# Patient Record
Sex: Female | Born: 1945
Health system: Southern US, Community
[De-identification: ages and names within clinical notes are randomized; demographics above are authoritative.]

## PROBLEM LIST (undated history)

## (undated) DIAGNOSIS — E785 Hyperlipidemia, unspecified: Secondary | ICD-10-CM

## (undated) DIAGNOSIS — K219 Gastro-esophageal reflux disease without esophagitis: Secondary | ICD-10-CM

## (undated) DIAGNOSIS — I10 Essential (primary) hypertension: Secondary | ICD-10-CM

## (undated) DIAGNOSIS — H409 Unspecified glaucoma: Secondary | ICD-10-CM

## (undated) DIAGNOSIS — G453 Amaurosis fugax: Secondary | ICD-10-CM

## (undated) HISTORY — DX: Essential (primary) hypertension: I10

## (undated) HISTORY — DX: Gastro-esophageal reflux disease without esophagitis: K21.9

## (undated) HISTORY — PX: NO PAST SURGERIES: SHX2092

## (undated) HISTORY — DX: Hyperlipidemia, unspecified: E78.5

## (undated) HISTORY — DX: Amaurosis fugax: G45.3

## (undated) HISTORY — DX: Unspecified glaucoma: H40.9

---

## 1998-10-02 ENCOUNTER — Other Ambulatory Visit: Admission: RE | Admit: 1998-10-02 | Discharge: 1998-10-02 | Payer: Self-pay | Admitting: *Deleted

## 1998-10-07 ENCOUNTER — Ambulatory Visit (HOSPITAL_COMMUNITY): Admission: RE | Admit: 1998-10-07 | Discharge: 1998-10-07 | Payer: Self-pay | Admitting: Internal Medicine

## 1999-12-04 ENCOUNTER — Ambulatory Visit (HOSPITAL_COMMUNITY): Admission: RE | Admit: 1999-12-04 | Discharge: 1999-12-04 | Payer: Self-pay | Admitting: Internal Medicine

## 1999-12-05 ENCOUNTER — Encounter: Payer: Self-pay | Admitting: Internal Medicine

## 2000-03-07 ENCOUNTER — Ambulatory Visit (HOSPITAL_COMMUNITY): Admission: RE | Admit: 2000-03-07 | Discharge: 2000-03-07 | Payer: Self-pay | Admitting: Family Medicine

## 2000-09-20 ENCOUNTER — Other Ambulatory Visit: Admission: RE | Admit: 2000-09-20 | Discharge: 2000-09-20 | Payer: Self-pay | Admitting: *Deleted

## 2002-11-01 ENCOUNTER — Other Ambulatory Visit: Admission: RE | Admit: 2002-11-01 | Discharge: 2002-11-01 | Payer: Self-pay | Admitting: *Deleted

## 2003-09-10 ENCOUNTER — Inpatient Hospital Stay (HOSPITAL_COMMUNITY): Admission: EM | Admit: 2003-09-10 | Discharge: 2003-09-11 | Payer: Self-pay | Admitting: Emergency Medicine

## 2005-07-27 ENCOUNTER — Other Ambulatory Visit: Admission: RE | Admit: 2005-07-27 | Discharge: 2005-07-27 | Payer: Self-pay | Admitting: Internal Medicine

## 2006-03-15 ENCOUNTER — Ambulatory Visit: Payer: Self-pay | Admitting: Family Medicine

## 2006-03-15 LAB — CONVERTED CEMR LAB
ALT: 17 units/L (ref 0–40)
Calcium: 9.4 mg/dL (ref 8.4–10.5)
Creatinine, Ser: 0.8 mg/dL (ref 0.4–1.2)
GFR calc non Af Amer: 78 mL/min
Glomerular Filtration Rate, Af Am: 94 mL/min/{1.73_m2}
Glucose, Bld: 117 mg/dL — ABNORMAL HIGH (ref 70–99)
VLDL: 10 mg/dL (ref 0–40)

## 2006-06-16 ENCOUNTER — Ambulatory Visit: Payer: Self-pay | Admitting: Family Medicine

## 2006-06-16 LAB — CONVERTED CEMR LAB
ALT: 22 units/L (ref 0–40)
BUN: 14 mg/dL (ref 6–23)
Calcium: 9.3 mg/dL (ref 8.4–10.5)
Chloride: 100 meq/L (ref 96–112)
GFR calc non Af Amer: 91 mL/min
Glucose, Bld: 111 mg/dL — ABNORMAL HIGH (ref 70–99)
Potassium: 4.4 meq/L (ref 3.5–5.1)
Total CHOL/HDL Ratio: 3.1
Triglycerides: 64 mg/dL (ref 0–149)
VLDL: 13 mg/dL (ref 0–40)

## 2006-07-15 ENCOUNTER — Ambulatory Visit: Payer: Self-pay | Admitting: Family Medicine

## 2006-11-14 ENCOUNTER — Ambulatory Visit: Payer: Self-pay | Admitting: Family Medicine

## 2006-11-14 DIAGNOSIS — E785 Hyperlipidemia, unspecified: Secondary | ICD-10-CM | POA: Insufficient documentation

## 2006-11-14 DIAGNOSIS — I10 Essential (primary) hypertension: Secondary | ICD-10-CM | POA: Insufficient documentation

## 2006-11-15 ENCOUNTER — Telehealth (INDEPENDENT_AMBULATORY_CARE_PROVIDER_SITE_OTHER): Payer: Self-pay | Admitting: *Deleted

## 2006-11-15 LAB — CONVERTED CEMR LAB
ALT: 21 units/L (ref 0–35)
AST: 21 units/L (ref 0–37)
Basophils Relative: 0.7 % (ref 0.0–1.0)
CO2: 34 meq/L — ABNORMAL HIGH (ref 19–32)
Calcium: 9.2 mg/dL (ref 8.4–10.5)
Chloride: 101 meq/L (ref 96–112)
Creatinine, Ser: 0.7 mg/dL (ref 0.4–1.2)
GFR calc non Af Amer: 91 mL/min
HDL: 43.8 mg/dL (ref >39.0)
Hemoglobin: 12.5 g/dL (ref 12.0–15.0)
Lymphocytes Relative: 39.3 % (ref 12.0–46.0)
MCHC: 34.3 g/dL (ref 30.0–36.0)
MCV: 83.8 fL (ref 78.0–100.0)
Neutro Abs: 2.5 10*3/uL (ref 1.4–7.7)
Neutrophils Relative %: 52.9 % (ref 43.0–77.0)
Platelets: 224 10*3/uL (ref 150–400)
Potassium: 3.6 meq/L (ref 3.5–5.1)
Total CHOL/HDL Ratio: 3.3
Triglycerides: 76 mg/dL (ref 0–149)
WBC: 4.7 10*3/uL (ref 4.5–10.5)

## 2007-01-04 ENCOUNTER — Ambulatory Visit (HOSPITAL_COMMUNITY): Admission: RE | Admit: 2007-01-04 | Discharge: 2007-01-04 | Payer: Self-pay | Admitting: Family Medicine

## 2007-01-04 LAB — HM MAMMOGRAPHY: HM Mammogram: NORMAL

## 2007-02-16 ENCOUNTER — Ambulatory Visit: Payer: Self-pay | Admitting: Family Medicine

## 2007-02-17 LAB — CONVERTED CEMR LAB
ALT: 23 units/L (ref 0–35)
AST: 24 units/L (ref 0–37)
Calcium: 9.6 mg/dL (ref 8.4–10.5)
GFR calc non Af Amer: 91 mL/min
Hgb A1c MFr Bld: 6.5 % — ABNORMAL HIGH (ref 4.6–6.0)
Potassium: 4 meq/L (ref 3.5–5.1)
Sodium: 142 meq/L (ref 135–145)

## 2007-02-20 ENCOUNTER — Telehealth (INDEPENDENT_AMBULATORY_CARE_PROVIDER_SITE_OTHER): Payer: Self-pay | Admitting: *Deleted

## 2007-02-23 ENCOUNTER — Ambulatory Visit: Payer: Self-pay | Admitting: Family Medicine

## 2007-02-28 ENCOUNTER — Telehealth (INDEPENDENT_AMBULATORY_CARE_PROVIDER_SITE_OTHER): Payer: Self-pay | Admitting: *Deleted

## 2007-02-28 LAB — CONVERTED CEMR LAB: Glucose, 2 hour: 166 mg/dL — ABNORMAL HIGH (ref 70–139)

## 2007-03-07 ENCOUNTER — Ambulatory Visit: Payer: Self-pay | Admitting: Family Medicine

## 2007-03-27 ENCOUNTER — Encounter: Admission: RE | Admit: 2007-03-27 | Discharge: 2007-03-27 | Payer: Self-pay | Admitting: Family Medicine

## 2007-04-18 ENCOUNTER — Telehealth (INDEPENDENT_AMBULATORY_CARE_PROVIDER_SITE_OTHER): Payer: Self-pay | Admitting: *Deleted

## 2007-05-16 ENCOUNTER — Encounter (INDEPENDENT_AMBULATORY_CARE_PROVIDER_SITE_OTHER): Payer: Self-pay | Admitting: Family Medicine

## 2007-05-22 ENCOUNTER — Telehealth (INDEPENDENT_AMBULATORY_CARE_PROVIDER_SITE_OTHER): Payer: Self-pay | Admitting: *Deleted

## 2007-06-15 ENCOUNTER — Ambulatory Visit: Payer: Self-pay | Admitting: Family Medicine

## 2007-06-15 LAB — CONVERTED CEMR LAB
ALT: 17 units/L (ref 0–35)
BUN: 12 mg/dL (ref 6–23)
Calcium: 9.3 mg/dL (ref 8.4–10.5)
Cholesterol: 140 mg/dL (ref 0–200)
Creatinine, Ser: 0.8 mg/dL (ref 0.4–1.2)
GFR calc Af Amer: 94 mL/min
Glucose, Bld: 111 mg/dL — ABNORMAL HIGH (ref 70–99)
LDL Cholesterol: 78 mg/dL (ref 0–99)
Microalb Creat Ratio: 3.2 mg/g (ref 0.0–30.0)
Potassium: 4 meq/L (ref 3.5–5.1)
Total CHOL/HDL Ratio: 3
VLDL: 16 mg/dL (ref 0–40)

## 2007-06-16 ENCOUNTER — Encounter (INDEPENDENT_AMBULATORY_CARE_PROVIDER_SITE_OTHER): Payer: Self-pay | Admitting: *Deleted

## 2007-10-03 ENCOUNTER — Ambulatory Visit: Payer: Self-pay | Admitting: Family Medicine

## 2007-10-03 DIAGNOSIS — K219 Gastro-esophageal reflux disease without esophagitis: Secondary | ICD-10-CM

## 2007-10-03 DIAGNOSIS — E119 Type 2 diabetes mellitus without complications: Secondary | ICD-10-CM

## 2007-10-04 LAB — CONVERTED CEMR LAB
ALT: 18 units/L (ref 0–35)
AST: 18 units/L (ref 0–37)
Alkaline Phosphatase: 53 units/L (ref 39–117)
Basophils Absolute: 0 10*3/uL (ref 0.0–0.1)
Basophils Relative: 0.3 % (ref 0.0–1.0)
Bilirubin, Direct: 0.1 mg/dL (ref 0.0–0.3)
Calcium: 9.2 mg/dL (ref 8.4–10.5)
Chloride: 103 meq/L (ref 96–112)
Eosinophils Relative: 1.4 % (ref 0.0–5.0)
GFR calc Af Amer: 109 mL/min
HCT: 37.6 % (ref 36.0–46.0)
HDL: 46.9 mg/dL (ref >39.0)
Hgb A1c MFr Bld: 6.4 % — ABNORMAL HIGH (ref 4.6–6.0)
LDL Cholesterol: 82 mg/dL (ref 0–99)
MCHC: 34.2 g/dL (ref 30.0–36.0)
Microalb Creat Ratio: 5.5 mg/g (ref 0.0–30.0)
Microalb, Ur: 0.2 mg/dL (ref 0.0–1.9)
Monocytes Relative: 6.5 % (ref 3.0–12.0)
Neutro Abs: 2.3 10*3/uL (ref 1.4–7.7)
Neutrophils Relative %: 50.9 % (ref 43.0–77.0)
Potassium: 4.3 meq/L (ref 3.5–5.1)
RBC: 4.46 M/uL (ref 3.87–5.11)
RDW: 13.5 % (ref 11.5–14.6)
Total CHOL/HDL Ratio: 3
Total Protein: 7 g/dL (ref 6.0–8.3)
VLDL: 15 mg/dL (ref 0–40)
WBC: 4.6 10*3/uL (ref 4.5–10.5)

## 2007-10-05 ENCOUNTER — Encounter (INDEPENDENT_AMBULATORY_CARE_PROVIDER_SITE_OTHER): Payer: Self-pay | Admitting: *Deleted

## 2008-01-10 ENCOUNTER — Telehealth (INDEPENDENT_AMBULATORY_CARE_PROVIDER_SITE_OTHER): Payer: Self-pay | Admitting: *Deleted

## 2008-01-23 ENCOUNTER — Ambulatory Visit: Payer: Self-pay | Admitting: Internal Medicine

## 2008-02-14 ENCOUNTER — Telehealth (INDEPENDENT_AMBULATORY_CARE_PROVIDER_SITE_OTHER): Payer: Self-pay | Admitting: *Deleted

## 2008-05-07 ENCOUNTER — Encounter: Payer: Self-pay | Admitting: Internal Medicine

## 2008-05-17 ENCOUNTER — Encounter: Payer: Self-pay | Admitting: Internal Medicine

## 2008-05-27 ENCOUNTER — Ambulatory Visit: Payer: Self-pay | Admitting: Internal Medicine

## 2008-05-27 LAB — HM DIABETES FOOT EXAM

## 2008-05-29 LAB — CONVERTED CEMR LAB
AST: 17 units/L (ref 0–37)
Calcium: 9.3 mg/dL (ref 8.4–10.5)
GFR calc Af Amer: 109 mL/min
GFR calc non Af Amer: 90 mL/min
Glucose, Bld: 118 mg/dL — ABNORMAL HIGH (ref 70–99)
Hgb A1c MFr Bld: 6.6 % — ABNORMAL HIGH (ref 4.6–6.0)

## 2008-09-24 ENCOUNTER — Ambulatory Visit: Payer: Self-pay | Admitting: Internal Medicine

## 2008-09-25 ENCOUNTER — Telehealth: Payer: Self-pay | Admitting: Internal Medicine

## 2008-09-25 ENCOUNTER — Encounter: Admission: RE | Admit: 2008-09-25 | Discharge: 2008-09-25 | Payer: Self-pay | Admitting: Internal Medicine

## 2008-09-26 ENCOUNTER — Encounter: Payer: Self-pay | Admitting: Internal Medicine

## 2008-09-26 ENCOUNTER — Ambulatory Visit: Payer: Self-pay

## 2008-09-27 ENCOUNTER — Telehealth (INDEPENDENT_AMBULATORY_CARE_PROVIDER_SITE_OTHER): Payer: Self-pay | Admitting: *Deleted

## 2008-10-01 ENCOUNTER — Ambulatory Visit: Payer: Self-pay

## 2008-10-01 ENCOUNTER — Encounter: Payer: Self-pay | Admitting: Internal Medicine

## 2008-10-08 ENCOUNTER — Telehealth (INDEPENDENT_AMBULATORY_CARE_PROVIDER_SITE_OTHER): Payer: Self-pay | Admitting: *Deleted

## 2008-12-11 ENCOUNTER — Other Ambulatory Visit: Admission: RE | Admit: 2008-12-11 | Discharge: 2008-12-11 | Payer: Self-pay | Admitting: Obstetrics and Gynecology

## 2009-01-28 ENCOUNTER — Ambulatory Visit: Payer: Self-pay | Admitting: Internal Medicine

## 2009-02-03 LAB — CONVERTED CEMR LAB
ALT: 17 units/L (ref 0–35)
AST: 20 units/L (ref 0–37)
BUN: 10 mg/dL (ref 6–23)
Creatinine, Ser: 0.7 mg/dL (ref 0.4–1.2)
Glucose, Bld: 86 mg/dL (ref 70–99)
Potassium: 3.6 meq/L (ref 3.5–5.1)
Sodium: 144 meq/L (ref 135–145)

## 2009-05-01 ENCOUNTER — Encounter: Payer: Self-pay | Admitting: Internal Medicine

## 2009-05-01 LAB — HM DIABETES EYE EXAM: HM Diabetic Eye Exam: NORMAL

## 2009-05-19 ENCOUNTER — Encounter: Payer: Self-pay | Admitting: Internal Medicine

## 2009-06-11 ENCOUNTER — Ambulatory Visit: Payer: Self-pay | Admitting: Internal Medicine

## 2009-06-11 DIAGNOSIS — E559 Vitamin D deficiency, unspecified: Secondary | ICD-10-CM

## 2009-06-13 LAB — CONVERTED CEMR LAB: Hgb A1c MFr Bld: 6.4 % (ref 4.6–6.5)

## 2009-10-14 ENCOUNTER — Ambulatory Visit: Payer: Self-pay | Admitting: Internal Medicine

## 2009-10-14 DIAGNOSIS — R51 Headache: Secondary | ICD-10-CM

## 2009-10-14 DIAGNOSIS — M7989 Other specified soft tissue disorders: Secondary | ICD-10-CM

## 2009-10-16 LAB — CONVERTED CEMR LAB
BUN: 15 mg/dL (ref 6–23)
Basophils Relative: 0.3 % (ref 0.0–3.0)
Creatinine,U: 25.8 mg/dL
HCT: 37 % (ref 36.0–46.0)
HDL: 49.2 mg/dL (ref >39.00)
Hemoglobin: 12.7 g/dL (ref 12.0–15.0)
Lymphocytes Relative: 41 % (ref 12.0–46.0)
MCHC: 34.4 g/dL (ref 30.0–36.0)
Microalb Creat Ratio: 4.3 mg/g (ref 0.0–30.0)
Microalb, Ur: 1.1 mg/dL (ref 0.0–1.9)
Monocytes Relative: 8.1 % (ref 3.0–12.0)
Neutrophils Relative %: 48.9 % (ref 43.0–77.0)
Platelets: 186 10*3/uL (ref 150.0–400.0)
RBC: 4.42 M/uL (ref 3.87–5.11)
Total CHOL/HDL Ratio: 3
Triglycerides: 65 mg/dL (ref 0.0–149.0)
VLDL: 13 mg/dL (ref 0.0–40.0)

## 2009-10-17 ENCOUNTER — Telehealth (INDEPENDENT_AMBULATORY_CARE_PROVIDER_SITE_OTHER): Payer: Self-pay | Admitting: *Deleted

## 2010-01-16 ENCOUNTER — Telehealth (INDEPENDENT_AMBULATORY_CARE_PROVIDER_SITE_OTHER): Payer: Self-pay | Admitting: *Deleted

## 2010-02-09 ENCOUNTER — Telehealth: Payer: Self-pay | Admitting: Internal Medicine

## 2010-02-09 ENCOUNTER — Ambulatory Visit: Payer: Self-pay | Admitting: Internal Medicine

## 2010-02-11 ENCOUNTER — Ambulatory Visit: Payer: Self-pay | Admitting: Internal Medicine

## 2010-02-11 DIAGNOSIS — T887XXA Unspecified adverse effect of drug or medicament, initial encounter: Secondary | ICD-10-CM | POA: Insufficient documentation

## 2010-02-20 ENCOUNTER — Telehealth: Payer: Self-pay | Admitting: Internal Medicine

## 2010-05-17 LAB — CONVERTED CEMR LAB
Hgb A1c MFr Bld: 6.5 % (ref 4.6–6.5)
LDL Cholesterol: 73 mg/dL (ref 0–99)
Microalb Creat Ratio: 1.3 mg/g (ref 0.0–30.0)
Microalb, Ur: 0.1 mg/dL (ref 0.0–1.9)
Total CHOL/HDL Ratio: 3
VLDL: 13.6 mg/dL (ref 0.0–40.0)

## 2010-05-19 NOTE — Progress Notes (Signed)
Summary: PAZ--REFILL  Phone Note Refill Request   Refills Requested: Medication #1:  ACIPHEX 20 MG  TBEC Take one tablet every day [facility] [address][phone] [phone]--LAST FILLED--8.24.09  Initial call taken by: Freddy Jaksch,  February 14, 2008 10:41 AM      Prescriptions: ACIPHEX 20 MG  TBEC (RABEPRAZOLE SODIUM) Take one tablet every day  #30 x 4   Entered by:   Kandice Hams   Authorized by:   Nolon Rod. Paz MD   Signed by:   Kandice Hams on 02/14/2008   Method used:   [facility]ed to ...       [facility]  [address]  [facility]       [address]       [place], [place]  [address]       Ph: [phone] or [phone]       Fax: [phone]   RxID:   [id number]

## 2010-05-19 NOTE — Progress Notes (Signed)
Summary: u/s results  Phone Note Outgoing Call Call back at [facility] Phone [phone]   Summary of Call: U/S RESULTS: advise patient: ,u/s ok call if any symptoms came back   Signed by [facility] E. Paz MD on 10/07/2008 at 10:10 PM   Follow-up for Phone Call        discussed with daughter Shary Decamp  October 08, 2008 3:38 PM

## 2010-05-19 NOTE — Letter (Signed)
Summary: (-) eye exam-----[facility]  [facility]   Imported By: Lanelle Bal 05/23/2008 11:13:53  _____________________________________________________________________  External Attachment:    Type:   Image     Comment:   External Document

## 2010-05-19 NOTE — Medication Information (Signed)
Summary: Prior Authorization & Approval for Aciphex/[employer]  Prior Authorization & Approval for Aciphex/[employer]   Imported By: Lanelle Bal 02/18/2010 15:32:42  _____________________________________________________________________  External Attachment:    Type:   Image     Comment:   External Document

## 2010-05-19 NOTE — Progress Notes (Signed)
Summary: Casey Bryan AUTH  Phone Note Refill Request Message from:  Fax from Pharmacy on February 09, 2010 3:26 PM  Refills Requested: Medication #1:  ACIPHEX 20 MG  TBEC Take one tablet every day  Medication #2:  VYTORIN 10-10 MG TABS 1 tab daily [facility] [address], [place] [phone]       ***PRIOR AUTH FOR TWO PRESCRIPTIONS*****   [phone]     PT ID# [id number]  Initial call taken by: Jerolyn Shin,  February 09, 2010 3:27 PM  Follow-up for Phone Call        Member id:  [id number] forms requested. Lucious Groves CMA  February 09, 2010 3:32 PM   Vytorin does not need prior authorization, it is rejecting--"refill too early". Patient can fill prescription tomorrow. Lucious Groves CMA  February 09, 2010 3:49 PM   Aciphex form completed and faxed, will await insurance company reply. Lucious Groves CMA  February 09, 2010 4:07 PM      Appended Document: Octavia Bruckner  PRIOR AUTH Aciphex approved until 01/2011.

## 2010-05-19 NOTE — Progress Notes (Signed)
Summary: LOWNE--REFILL  Phone Note Refill Request   Refills Requested: Medication #1:  HYDROCHLOROTHIAZIDE 25 MG  TABS take one tablet daily  Medication #2:  VYTORIN 10-10 MG TABS 1 tab daily  Medication #3:  METOPROLOL TARTRATE 50 MG  TABS 1 twice daily  Medication #4:  ACIPHEX 20 MG  TBEC Take one tablet every day [facility] [address][phone]  Initial call taken by: Freddy Jaksch,  January 10, 2008 8:43 AM  Follow-up for Phone Call        Zocor is on pt med list, pt said she never took the Zocor did not like it has rx but never filled, she it taking Vytorin. Pt has appt 01/23/08 with Dr Drue Novel, per Dr Laury Axon ok to fill rx for 30 days. Kandice Hams  January 10, 2008 11:28 AM  Follow-up by: Kandice Hams,  January 10, 2008 11:28 AM      Prescriptions: HYDROCHLOROTHIAZIDE 25 MG  TABS (HYDROCHLOROTHIAZIDE) take one tablet daily  #30 x 0   Entered by:   Kandice Hams   Authorized by:   Loreen Freud DO   Signed by:   Kandice Hams on 01/10/2008   Method used:   [facility] ...       [facility]  [address]  [facility]       [address]       [place], [place]  [address]       Ph: [phone] or [phone]       Fax: [phone]   RxID:   [id number] ACIPHEX 20 MG  TBEC (RABEPRAZOLE SODIUM) Take one tablet every day  #30 x 0   Entered by:   Kandice Hams   Authorized by:   Loreen Freud DO   Signed by:   Kandice Hams on 01/10/2008   Method used:   [facility]ed to ...       [facility]  [address]  [facility]       [address]       [place], [place]  [address]       Ph: [phone] or [phone]       Fax: [phone]   RxID:   [id number] METOPROLOL TARTRATE 50 MG  TABS (METOPROLOL TARTRATE) 1 twice daily  #60 x 0   Entered by:   Kandice Hams   Authorized by:   Loreen Freud DO   Signed by:   Kandice Hams on 01/10/2008   Method used:   [facility]ed to ...       [facility]  [address]  [facility]  [address]       [place], [place]  [address]       Ph: [phone] or [phone]       Fax: [phone]   RxID:   [id number] VYTORIN 10-10 MG TABS (EZETIMIBE-SIMVASTATIN) 1 tab daily  #30 x 0   Entered by:   Kandice Hams   Authorized by:   Loreen Freud DO   Signed by:   Kandice Hams on 01/10/2008   Method used:   F[facility]ed to ...       [facility]  [address]  [facility]       [address]       [place], [place]  [address]       Ph: [phone] or [phone]       Fax: [phone]   RxID:   [id number]

## 2010-05-19 NOTE — Progress Notes (Signed)
Summary: refill  Phone Note Refill Request   Refills Requested: Medication #1:  ACIPHEX 20 MG  TBEC Take one tablet every day  Medication #2:  VYTORIN 10-10 MG TABS 1 tab daily  Medication #3:  VITAMIN D (ERGOCALCIFEROL) 50000 UNIT CAPS 1 by mouth once weekly for 3 months.[employer]   Initial call taken by: Okey Regal [place],  January 16, 2010 4:07 PM    Prescriptions: ACIPHEX 20 MG  TBEC (RABEPRAZOLE SODIUM) Take one tablet every day  #90 Tablet x 0   Entered by:   Army Fossa CMA   Authorized by:   Nolon Rod. Paz MD   Signed by:   Army Fossa CMA on 01/16/2010   Method used:   [facility]ronically to        [facility] [address] [facility]       [address]       [place], [place]  [address]       Ph: [phone] or [phone]       Fax: [phone]   RxID:   [id number] VYTORIN 10-10 MG TABS (EZETIMIBE-SIMVASTATIN) 1 tab daily  #90 Tablet x 0   Entered by:   Army Fossa CMA   Authorized by:   Nolon Rod. Paz MD   Signed by:   Army Fossa CMA on 01/16/2010   Method used:   [facility]ronically to        [facility] [address] [facility]       [address]       [place], [place]  [address]       Ph: [phone] or [phone]       Fax: [phone]   RxID:   [id number]

## 2010-05-19 NOTE — Letter (Signed)
Summary: CMN for Diabetes Supplies/[facility]  CMN for Diabetes Supplies/[facility]   Imported By: Lanelle Bal 05/13/2008 13:45:20  _____________________________________________________________________  External Attachment:    Type:   Image     Comment:   External Document

## 2010-05-19 NOTE — Progress Notes (Signed)
Summary: 6/9  Phone Note Call from Patient Call back at [phone] DAUGHTER Casey Bryan   Caller: Daughter Call For: Casey E. Adalea Handler MD Reason for Call: Talk to Nurse, Lab or Test Results, Referral Summary of Call: PATIENT'S DAUGHTER, Casey Bryan, IS CALLING FOR RESULTS OF MRI.  Casey Bryan ALSO WANTS TO KNOW WHY DR. Lorah Kalina IS REFERRING PATIENT FOR AN ECHO & A CAROTID US.  THE APPOINTMENT FOR THE ECHO IS TOMORROW, 09-26-08 @ 4PM @ [facility].  I INFORMED Casey Bryan, BUT SHE WOULD NOT CONFIRM, WANTS ANSWERS PRIOR.   Initial call taken by: Magdalen Spatz [facility],  September 25, 2008 3:28 PM  Follow-up for Phone Call        [facility] for pt to return call Shary Decamp  September 25, 2008 4:06 PM  discussed with daughter reason for ECHO & carotid u/s -- appt info given to pt about ECHO Shary Decamp  September 25, 2008 4:20 PM

## 2010-05-19 NOTE — Assessment & Plan Note (Signed)
Summary: ro4/4 month ov//ph   Vital Signs:  Patient Profile:   65 Years Old Female Weight:      233 pounds Pulse rate:   66 / minute Pulse rhythm:   regular BP sitting:   146 / 82  (left arm) Cuff size:   large  Vitals Entered By: Shary Decamp (May 27, 2008 9:24 AM)                 PCP:  Laury Axon  Chief Complaint:  rov - fasting.  History of Present Illness: ROV, here w/ 2 family members  cold x 2 weeks, getting better , wonders what is ok to use OTC (see instructions ) Hyperlipidemia-- states eats low fat Hypertension-- no ambulatory BPs  Diabetes-ambulatory CBGs in AM 106 to  145     Current Allergies (reviewed today): ! PENICILLIN  Past Medical History:    Reviewed history from 10/03/2007 and no changes required:       Hyperlipidemia       Hypertension       GERD       Diabetes mellitus, type II  Past Surgical History:    no   Family History:    MI-- F, brother, GP    DM-- (+) , late onset, parents and uncles     colon ca--no    breast ca-- aunt     m myeloma-- sister   Social History:    Reviewed history from 01/23/2008 and no changes required:       Married       3 children        original  [place]   Risk Factors:  Tobacco use:  quit    Year quit:  yearsa ago    Comments:  socially    Review of Systems  CV      Denies chest pain or discomfort, palpitations, and swelling of feet.  Resp      Denies shortness of breath.      cough sometimes (had a cold)  Neuro      occ R arm tingling from the neck down had a MRI done years ago was told she had a "pinck nerve" symptoms are from time to time   Endo      no LE paresthesias    Physical Exam  General:     alert and overweight-appearing.   Lungs:     normal respiratory effort, no intercostal retractions, no accessory muscle use, and normal breath sounds.   Heart:     normal rate, regular rhythm, and no murmur.   Pulses:     normal pedal pulses bilaterally  Extremities:    no pretibial edema bilaterally  Psych:     Cognition and judgment appear intact. Alert and cooperative with normal attention span and concentration.    Diabetes Management Exam:    Foot Exam (with socks and/or shoes not present):       Sensory-Pinprick/Light touch:          Left medial foot (L-4): normal          Left dorsal foot (L-5): normal          Left lateral foot (S-1): normal          Right medial foot (L-4): normal          Right dorsal foot (L-5): normal          Right lateral foot (S-1): normal       Sensory-Monofilament:  Left foot: normal          Right foot: normal       Inspection:          Left foot: normal          Right foot: normal       Nails:          Left foot: normal          Right foot: normal    Eye Exam:       Eye Exam done elsewhere          Date: 04/19/2008          Results: normal          Done by: Hazle Quant    Impression & Recommendations:  Problem # 1:  DIABETES MELLITUS, TYPE II (ICD-250.00) needs Rx for glucometer, done  see instructions  printed material provided regards A1C-CBG goals  check CBGs at different times during the day eye check 1-10, neg Orders: Venipuncture ([address]) TLB-A1C / Hgb A1C (Glycohemoglobin) (83036-A1C)  Labs Reviewed: HgBA1c: 6.3 (01/23/2008)   Creat: 0.7 (10/03/2007)   Microalbumin: 0.2 (10/03/2007)  Orders: Venipuncture ([address]) TLB-A1C / Hgb A1C (Glycohemoglobin) (83036-A1C)   Problem # 2:  HYPERTENSION (ICD-401.9) BP slightly  elevated today, usually good, see instructions  Her updated medication list for this problem includes:    Metoprolol Tartrate 50 Mg Tabs (Metoprolol tartrate) .Marland Kitchen... 1 twice daily    Hydrochlorothiazide 25 Mg Tabs (Hydrochlorothiazide) .Marland Kitchen... Take one tablet daily  Orders: TLB-BMP (Basic Metabolic Panel-BMET) (80048-METABOL) TLB-ALT (SGPT) (84460-ALT) TLB-AST (SGOT) (84450-SGOT)  BP today: 146/82 Prior BP: 138/76 (01/23/2008)  Labs Reviewed: Creat: 0.7  (10/03/2007) Chol: 143 (10/03/2007)   HDL: 46.9 (10/03/2007)   LDL: 82 (10/03/2007)   TG: 73 (10/03/2007)   Problem # 3:  HYPERLIPIDEMIA (ICD-272.4) labs  Her updated medication list for this problem includes:    Vytorin 10-10 Mg Tabs (Ezetimibe-simvastatin) .Marland Kitchen... 1 tab daily  Labs Reviewed: Chol: 143 (10/03/2007)   HDL: 46.9 (10/03/2007)   LDL: 82 (10/03/2007)   TG: 73 (10/03/2007) SGOT: 18 (10/03/2007)   SGPT: 18 (10/03/2007)   Problem # 4:  URI  resolving but likes to know what to take in that situation: see instructions   Complete Medication List: 1)  Vytorin 10-10 Mg Tabs (Ezetimibe-simvastatin) .Marland Kitchen.. 1 tab daily 2)  Metoprolol Tartrate 50 Mg Tabs (Metoprolol tartrate) .Marland Kitchen.. 1 twice daily 3)  Aciphex 20 Mg Tbec (Rabeprazole sodium) .... Take one tablet every day 4)  Hydrochlorothiazide 25 Mg Tabs (Hydrochlorothiazide) .... Take one tablet daily 5)  Zantac 150 Mg Caps (Ranitidine hcl) .Marland Kitchen.. 1 by mouth two times a day as needed 6)  Advocate Redi-code Devi (Blood glucose monitoring suppl) .... Dx 250.00 7)  Advocate Redi-code Strp (Glucose blood) .... Checks blood sugar 1x/day dx 250.00 8)  Advocate Lancets Misc (Lancets) .... Check bs 1x/day dx 250.00   Patient Instructions: 1)  Check your blood pressure 2 or 3 times a week. If it is more than 140/85 consistently,please let us know  2)  for colds is ok to use: Tylenol, Mucnex DM  and benadryl OTCs 3)  Please schedule a follow-up appointment in 4 months.   Prescriptions: ADVOCATE LANCETS  MISC (LANCETS) CHECK BS 1X/DAY DX 250.00  #100 x 1   Entered by:   Shary Decamp   Authorized by:   Nolon Rod. Waldine Zenz MD   Signed by:   Shary Decamp on 05/27/2008   Method used:   Print then  Give to Patient   RxID:   [id number] ADVOCATE REDI-CODE  STRP (GLUCOSE BLOOD) CHECKS BLOOD SUGAR 1X/DAY DX 250.00  #100 x 1   Entered by:   Shary Decamp   Authorized by:   Nolon Rod. Nichael Ehly MD   Signed by:   Shary Decamp on 05/27/2008   Method used:   Print  then Give to Patient   RxID:   [id number] ADVOCATE REDI-CODE  DEVI (BLOOD GLUCOSE MONITORING SUPPL) dx 250.00  #1 x 0   Entered by:   Shary Decamp   Authorized by:   Nolon Rod. Juanice Warburton MD   Signed by:   Shary Decamp on 05/27/2008   Method used:   Print then Give to Patient   RxID:   [id number]

## 2010-05-19 NOTE — Assessment & Plan Note (Signed)
Summary: 4 MONTH FOLLOWUP//KN   Vital Signs:  Patient profile:   65 year old female Weight:      231.38 pounds Pulse rate:   58 / minute Pulse rhythm:   regular BP sitting:   138 / 82  (left arm) Cuff size:   large  Vitals Entered By: Army Fossa CMA (February 09, 2010 10:38 AM) CC: 4 month f/u- fasting Comments c/o dry skin on toes flu shot [facility] [address]   History of Present Illness:  routine office visit  Since the last office visit, she is doing well, she was seen with back pain which is now better. She also has a left great toe ingrown  nail which spontaneously resolved  Current Medications (verified): 1)  Vytorin 10-10 Mg Tabs (Ezetimibe-Simvastatin) .Marland Kitchen.. 1 Tab Daily 2)  Metoprolol Tartrate 50 Mg  Tabs (Metoprolol Tartrate) .Marland Kitchen.. 1 Twice Daily 3)  Aciphex 20 Mg  Tbec (Rabeprazole Sodium) .... Take One Tablet Every Day 4)  Hydrochlorothiazide 25 Mg  Tabs (Hydrochlorothiazide) .... Take One Tablet Daily 5)  Zantac 150 Mg  Caps (Ranitidine Hcl) .Marland Kitchen.. 1 By Mouth Once Daily As Needed 6)  Advocate Redi-Code  Devi (Blood Glucose Monitoring Suppl) .... Dx 250.00 7)  Advocate Redi-Code  Strp (Glucose Blood) .... Checks Blood Sugar 1x/day Dx 250.00 8)  Advocate Lancets  Misc (Lancets) .... Check Bs 1x/day Dx 250.00 9)  Calcium 600 .... Bid 10)  Vitamin D 400 .... Once Daily  Allergies (verified): 1)  ! Penicillin  Past History:  Past Medical History: Reviewed history from 10/14/2009 and no changes required. Hyperlipidemia Hypertension GERD Diabetes mellitus, type II  Past Surgical History: Reviewed history from 05/27/2008 and no changes required. no  Social History: Reviewed history from 01/23/2008 and no changes required. Married 3 children  original  [place] tobacco--no ETOH-- socially   Review of Systems       she is status post ergocalciferol, now taking over-the-counter vitamin D As far as her diabetes, her ambulatory CBGs are always less than  140 As far as her hyperlipidemia, she takes her medications every day. She is doing better with her diet, has cut down on sweets She is trying to walk more than before Desires a flu shot  Physical Exam  General:  alert, well-developed, and overweight-appearing.   Lungs:  normal respiratory effort, no intercostal retractions, no accessory muscle use, and normal breath sounds.   Heart:  normal rate, regular rhythm, and no murmur.   Extremities:  no pitting edema left great toe normal to inspection, no evidence of infection   Impression & Recommendations:  Problem # 1:  VITAMIN D DEFICIENCY (ICD-268.9) status post ergocalciferol, now on over-the-counter meds  Problem # 2:  DIABETES MELLITUS, TYPE II (ICD-250.00) patient reports improvement on her lifestyle...Marland KitchenMarland Kitchen praised  Labs Labs Reviewed: Creat: 0.7 (10/14/2009)     Last Eye Exam: normal (05/01/2009) Reviewed HgBA1c results: 6.7 (10/14/2009)  6.4 (06/11/2009)  Orders: TLB-A1C / Hgb A1C (Glycohemoglobin) (83036-A1C) TLB-TSH (Thyroid Stimulating Hormone) (84443-TSH) Specimen Handling ([address])  Problem # 3:  HYPERTENSION (ICD-401.9) at goal  Her updated medication list for this problem includes:    Metoprolol Tartrate 50 Mg Tabs (Metoprolol tartrate) .Marland Kitchen... 1 twice daily    Hydrochlorothiazide 25 Mg Tabs (Hydrochlorothiazide) .Marland Kitchen... Take one tablet daily  BP today: 138/82 Prior BP: 142/84 (10/14/2009)  Labs Reviewed: K+: 4.3 (10/14/2009) Creat: : 0.7 (10/14/2009)   Chol: 148 (10/14/2009)   HDL: 49.20 (10/14/2009)   LDL: 86 (10/14/2009)   TG:  65.0 (10/14/2009)  Problem # 4:  HYPERLIPIDEMIA (ICD-272.4)  good medication compliance, check LFTs Her updated medication list for this problem includes:    Vytorin 10-10 Mg Tabs (Ezetimibe-simvastatin) .Marland Kitchen... 1 tab daily  Labs Reviewed: SGOT: 20 (01/28/2009)   SGPT: 17 (01/28/2009)   HDL:49.20 (10/14/2009), 47.40 (09/24/2008)  LDL:86 (10/14/2009), 73 (09/24/2008)  Chol:148  (10/14/2009), 134 (09/24/2008)  Trig:65.0 (10/14/2009), 68.0 (09/24/2008)  Orders: Venipuncture ([address]) TLB-ALT (SGPT) (84460-ALT) TLB-AST (SGOT) (84450-SGOT) Specimen Handling ([address])  Problem # 5:  HEALTH SCREENING (ICD-V70.0) last tetanus shot less than 10 years per patient Flu shot today Due for a complete physical, see instructions would like to start having her PAPs her  Complete Medication List: 1)  Vytorin 10-10 Mg Tabs (Ezetimibe-simvastatin) .Marland Kitchen.. 1 tab daily 2)  Metoprolol Tartrate 50 Mg Tabs (Metoprolol tartrate) .Marland Kitchen.. 1 twice daily 3)  Aciphex 20 Mg Tbec (Rabeprazole sodium) .... Take one tablet every day 4)  Hydrochlorothiazide 25 Mg Tabs (Hydrochlorothiazide) .... Take one tablet daily 5)  Zantac 150 Mg Caps (Ranitidine hcl) .Marland Kitchen.. 1 by mouth once daily as needed 6)  Advocate Redi-code Devi (Blood glucose monitoring suppl) .... Dx 250.00 7)  Advocate Redi-code Strp (Glucose blood) .... Checks blood sugar 1x/day dx 250.00 8)  Advocate Lancets Misc (Lancets) .... Check bs 1x/day dx 250.00 9)  Calcium 1gram  .... Two times a day 10)  Vitamin D 600  .... Once daily  Other Orders: Admin 1st Vaccine (02637) Flu Vaccine 50yrs + (85885) Tdap => 54yrs IM (02774) Admin of Any Addtl Vaccine (12878)  Patient Instructions: 1)  Please schedule a follow-up appointment in 4 to 5  months, physical exam Prescriptions: HYDROCHLOROTHIAZIDE 25 MG  TABS (HYDROCHLOROTHIAZIDE) take one tablet daily  #90 Tablet x 2   Entered by:   Army Fossa CMA   Authorized by:   Nolon Rod. Paz MD   Signed by:   Army Fossa CMA on 02/09/2010   Method used:   [facility] [address]       [address]       [place], [place]  [address]       Ph: [phone] or [phone]       Fax: [phone]   RxID:   [id number] ACIPHEX 20 MG  TBEC (RABEPRAZOLE SODIUM) Take one tablet every day  #90 Tablet x 2   Entered by:   Army Fossa CMA   Authorized by:   Nolon Rod. Paz  MD   Signed by:   Army Fossa CMA on 02/09/2010   Method used:   Electronically to        [facility] [address]       [address]       [place], [place]  [address]       Ph: [phone] or [phone]       Fax: [phone]   RxID:   [id number] METOPROLOL TARTRATE 50 MG  TABS (METOPROLOL TARTRATE) 1 twice daily  #180 Tablet x 2   Entered by:   Army Fossa CMA   Authorized by:   Nolon Rod. Paz MD   Signed by:   Army Fossa CMA on 02/09/2010   Method used:   [facility]        [facility]       [address]       [place], [place]  [address]       Ph: [phone] or [phone]  Fax: [phone]   RxID:   [id number] VYTORIN 10-10 MG TABS (EZETIMIBE-SIMVASTATIN) 1 tab daily  #90 Tablet x 2   Entered by:   Army Fossa CMA   Authorized by:   Nolon Rod. Paz MD   Signed by:   Army Fossa CMA on 02/09/2010   Method used:   [facility]ronically to        [facility] [address]       [address]       [place], [place]  [address]       Ph: [phone] or [phone]       Fax: [phone]   RxID:   [id number] VYTORIN 10-10 MG TABS (EZETIMIBE-SIMVASTATIN) 1 tab daily  #90 Tablet x 0   Entered by:   Army Fossa CMA   Authorized by:   Nolon Rod. Paz MD   Signed by:   Army Fossa CMA on 02/09/2010   Method used:   Electronically to        [facility]* (retail)       [address]       [place], [place]  [address]       Ph: [phone]       Fax: [phone]   RxID:   [id number]    Orders Added: 1)  Venipuncture [36415] 2)  TLB-A1C / Hgb A1C (Glycohemoglobin) [83036-A1C] 3)  TLB-ALT (SGPT) [84460-ALT] 4)  TLB-AST (SGOT) [84450-SGOT] 5)  TLB-TSH (Thyroid Stimulating Hormone) [84443-TSH] 6)  Specimen Handling [99000] 7)  Admin 1st Vaccine [90471] 8)  Flu Vaccine 31yrs + [90658] 9)  Tdap => 62yrs IM [90715] 10)  Admin of Any Addtl Vaccine [90472] 11)  Est. Patient Level III [62831] Flu Vaccine  Consent Questions     Do you have a history of severe allergic reactions to this vaccine? no    Any prior history of allergic reactions to egg and/or gelatin? no    Do you have a sensitivity to the preservative Thimersol? no    Do you have a past history of Guillan-Barre Syndrome? no    Do you currently have an acute febrile illness? no    Have you ever had a severe reaction to latex? no    Vaccine information given and explained to patient? yes    Are you currently pregnant? no    Lot Number:[id number]   Exp Date:10/17/2010   Site Given  right Deltoid IM 7)  Admin 1st Vaccine [90471] 8)  Flu Vaccine 70yrs + [51761]   Immunizations Administered:  Tetanus Vaccine:    Vaccine Type: Tdap    Site: left deltoid    Mfr: GlaxoSmithKline    Dose: 0.5 ml    Route: IM    Given by: Army Fossa CMA    Exp. Date: 02/06/2012    Lot #: [id number]   Immunizations Administered:  Tetanus Vaccine:    Vaccine Type: Tdap    Site: left deltoid    Mfr: GlaxoSmithKline    Dose: 0.5 ml    Route: IM    Given by: Army Fossa CMA    Exp. Date: 02/06/2012    Lot #: [id number]   .lbflu1

## 2010-05-19 NOTE — Miscellaneous (Signed)
Summary: no diabetic retinopathy   Clinical Lists Changes  Observations: Added new observation of DMEYEEXAMNXT: 04/2010 (05/01/2009 13:48) Added new observation of DMEYEEXMRES: normal (05/01/2009 13:48) Added new observation of EYE EXAM BY: [facility] (05/01/2009 13:48) Added new observation of DIAB EYE EX: normal (05/01/2009 13:48)       Allergies: 1)  ! Penicillin   Diabetes Management Exam:    Eye Exam:       Eye Exam done elsewhere          Date: 05/01/2009          Results: normal          Done by: [facility]

## 2010-05-19 NOTE — Letter (Signed)
Summary: Results Follow-up Letter  [place] [facility]  [address] [place], [place] [address]   Phone: [phone]  Fax: [phone]    06/16/2007        Casey Bryan [address] [place], [place]  [address]  Dear Ms. Danser,   The following are the results of your recent test(s):  Test     Result     Pap Smear    Normal_______  Not Normal_____       Comments: _________________________________________________________ Cholesterol LDL(Bad cholesterol):          Your goal is less than:         HDL (Good cholesterol):        Your goal is more than: _________________________________________________________ Other Tests:   _________________________________________________________  Please call for an appointment Or Please see attached._________________________________________________________ _________________________________________________________ _________________________________________________________  Sincerely,  Ardyth Man [place] at [facility]

## 2010-05-19 NOTE — Assessment & Plan Note (Signed)
Summary: 4 month roa//lch   Vital Signs:  Patient profile:   65 year old female Weight:      232.50 pounds Pulse rate:   65 / minute Pulse rhythm:   regular BP sitting:   142 / 84  (left arm) Cuff size:   large  Vitals Entered By: Army Fossa CMA (October 14, 2009 10:32 AM) CC: Pt here to follow up on BP, cholesterol, and DM. Comments C/o back pain that now goes down her leg.   History of Present Illness: ROV Hyperlipidemia-- good medication compliance , diet good  Hypertension-- check ambulatory BPs  "sometimes", occasionally in the 160s.usually in the 140s Diabetes -- ambulatory CBGs 110 to 145, on diet only   also c/o back pain w/ rad to L leg on-off x 3 years had MRIs, saw ortho, dx w/ a "pinch nerve" options were surgery-local shots-pain meds   lites her vitamin D check  Allergies: 1)  ! Penicillin  Past History:  Past Medical History: Hyperlipidemia Hypertension GERD Diabetes mellitus, type II  Past Surgical History: Reviewed history from 05/27/2008 and no changes required. no  Social History: Reviewed history from 01/23/2008 and no changes required. Married 3 children  original  [place]  Review of Systems General:  no fever no b/b incontinence  occasionally paresthesia ("tingling") in the left leg. CV:  Denies chest pain or discomfort; chronic lower ext. edema, slightly  worse lately (L>R). Resp:  Denies cough and shortness of breath. GI:  Denies nausea and vomiting; occasionally blood in stools , thinks related to hemorrhoids, Cscope aprox 2008 per patient .  Physical Exam  General:  alert, well-developed, and overweight-appearing.   Lungs:  normal respiratory effort, no intercostal retractions, no accessory muscle use, and normal breath sounds.   Heart:  normal rate, regular rhythm, and no murmur.   Msk:  slightly tender at the left side of low back Extremities:  no pitting edema right leg is larger by .2  inch at the calf, not tender to  palpation. Neurologic:  lower extremities: DTRs symmetric Motor intact Straight leg test negative   Impression & Recommendations:  Problem # 1:  BACK PAIN (ICD-724.5) Assessment New c/o back pain w/ rad to L leg on-off x 3 years had MRIs, saw ortho, dx w/ a "pinch nerve" options were surgery-local shots-pain meds  she elected to have pain medication Plan: Tylenol, see instructions Flexeril as needed refer to ortho (L leg w/  atrophy ? see #7)---> patient declined  Her updated medication list for this problem includes:    Flexeril 10 Mg Tabs (Cyclobenzaprine hcl) ..... One by mouth at bedtime as needed for pain  Problem # 2:  VITAMIN D DEFICIENCY (ICD-268.9) recheck labs currently on 400u vit d a day had ergocalciferol before Rx per gyn  Orders: T-Vitamin D (25-Hydroxy) ([address])  Problem # 3:  DIABETES MELLITUS, TYPE II (ICD-250.00) on diet only, labs  Labs Reviewed: Creat: 0.7 (01/28/2009)     Last Eye Exam: normal (05/01/2009) Reviewed HgBA1c results: 6.4 (06/11/2009)  6.4 (01/28/2009)  Orders: TLB-A1C / Hgb A1C (Glycohemoglobin) (83036-A1C) TLB-Microalbumin/Creat Ratio, Urine (82043-MALB)  Problem # 4:  HEALTH SCREENING (ICD-V70.0) reports she saw a gyn, last 12-10 per patient reports a previous Cscope Dr Randa Evens aprox 2008 per patient   Problem # 5:  HYPERTENSION (ICD-401.9) ambulatory BP is slightly elevated? see  instructions Her updated medication list for this problem includes:    Metoprolol Tartrate 50 Mg Tabs (Metoprolol tartrate) .Marland Kitchen... 1 twice daily  Hydrochlorothiazide 25 Mg Tabs (Hydrochlorothiazide) .Marland Kitchen... Take one tablet daily    BP today: 142/84 Prior BP: 134/72 (06/11/2009)  Labs Reviewed: K+: 3.6 (01/28/2009) Creat: : 0.7 (01/28/2009)   Chol: 134 (09/24/2008)   HDL: 47.40 (09/24/2008)   LDL: 73 (09/24/2008)   TG: 68.0 (09/24/2008)  Orders: Venipuncture ([address]) TLB-BMP (Basic Metabolic Panel-BMET) (80048-METABOL) TLB-CBC  Platelet - w/Differential (85025-CBCD)  Problem # 6:  HYPERLIPIDEMIA (ICD-272.4) due for labs, diet discussed Her updated medication list for this problem includes:    Vytorin 10-10 Mg Tabs (Ezetimibe-simvastatin) .Marland Kitchen... 1 tab daily    Labs Reviewed: SGOT: 20 (01/28/2009)   SGPT: 17 (01/28/2009)   HDL:47.40 (09/24/2008), 46.9 (10/03/2007)  LDL:73 (09/24/2008), 82 (10/03/2007)  Chol:134 (09/24/2008), 143 (10/03/2007)  Trig:68.0 (09/24/2008), 73 (10/03/2007)  Orders: TLB-Lipid Panel (80061-LIPID)  Problem # 7:  SWELLING OF LIMB (ICD-729.81)  right leg larger than the left on the calf, not painful. Unclear if this is a chronic issue but suspect it is as she is totaly asx  atrophy of L leg from chronic back pain? Check ultrasound to r/o DVT --patient declined  Complete Medication List: 1)  Vytorin 10-10 Mg Tabs (Ezetimibe-simvastatin) .Marland Kitchen.. 1 tab daily 2)  Metoprolol Tartrate 50 Mg Tabs (Metoprolol tartrate) .Marland Kitchen.. 1 twice daily 3)  Aciphex 20 Mg Tbec (Rabeprazole sodium) .... Take one tablet every day 4)  Hydrochlorothiazide 25 Mg Tabs (Hydrochlorothiazide) .... Take one tablet daily 5)  Zantac 150 Mg Caps (Ranitidine hcl) .Marland Kitchen.. 1 by mouth once daily as needed 6)  Advocate Redi-code Devi (Blood glucose monitoring suppl) .... Dx 250.00 7)  Advocate Redi-code Strp (Glucose blood) .... Checks blood sugar 1x/day dx 250.00 8)  Advocate Lancets Misc (Lancets) .... Check bs 1x/day dx 250.00 9)  Calcium 600  .... Bid 10)  Vitamin D 400  .... Once daily 11)  Flexeril 10 Mg Tabs (Cyclobenzaprine hcl) .... One by mouth at bedtime as needed for pain  Patient Instructions: 1)  for  back pain take  1000 mg of tylenol every 4-6 hours as needed Avoid taking more than 4000 mg in a 24 hour period ( can cause liver damage in higher doses).  2)  Flexeril at bedtime, it is a muscle relaxant 3)  Low-salt diet and try to walk daily for 30 minutes 4)  Check your blood pressure 2 or 3 times a week. If it is  more than 140/85 consistently,please let us know  5)  Please schedule a follow-up appointment in 4 months .  Prescriptions: FLEXERIL 10 MG TABS (CYCLOBENZAPRINE HCL) one by mouth at bedtime as needed for pain  #30 x 6   Entered and Authorized by:   Elita Quick E. Michele Judy MD   Signed by:   Nolon Rod. Kashtyn Jankowski MD on 10/14/2009   Method used:   Print then Give to Patient   RxID:   [id number]

## 2010-05-19 NOTE — Assessment & Plan Note (Signed)
Summary: POSSIBLE ALLERGY TO FLU OR TD VACC./KB   Vital Signs:  Patient profile:   65 year old female Weight:      229.13 pounds Pulse rate:   82 / minute Pulse rhythm:   regular BP sitting:   136 / 86  (left arm) Cuff size:   large  Vitals Entered By: Army Fossa CMA (February 11, 2010 3:50 PM) CC: Pt here for possible reaction to Tdap or Flu?  Comments (L) arm very sore. Has been nauseas temp around 103.0  [facility] [address]    History of Present Illness: 2 days ago she block the flu shot and a tetanus shot. 20 hours later she developed the following symptoms: Fever up to 103, chills Some nausea The area of the Td shot (L shoulder)  got  quite sore, red and slightly swollen. the whole left shoulder was hurting, today the pain is better. The area of the flu shot (right shoulder) seemed to be okay   ROS Denies generalized itching No tongue or facial swelling No shortness of breath or cough  Current Medications (verified): 1)  Vytorin 10-10 Mg Tabs (Ezetimibe-Simvastatin) .Marland Kitchen.. 1 Tab Daily 2)  Metoprolol Tartrate 50 Mg  Tabs (Metoprolol Tartrate) .Marland Kitchen.. 1 Twice Daily 3)  Aciphex 20 Mg  Tbec (Rabeprazole Sodium) .... Take One Tablet Every Day 4)  Hydrochlorothiazide 25 Mg  Tabs (Hydrochlorothiazide) .... Take One Tablet Daily 5)  Zantac 150 Mg  Caps (Ranitidine Hcl) .Marland Kitchen.. 1 By Mouth Once Daily As Needed 6)  Advocate Redi-Code  Devi (Blood Glucose Monitoring Suppl) .... Dx 250.00 7)  Advocate Redi-Code  Strp (Glucose Blood) .... Checks Blood Sugar 1x/day Dx 250.00 8)  Advocate Lancets  Misc (Lancets) .... Check Bs 1x/day Dx 250.00 9)  Calcium 1gram .... Two Times A Day 10)  Vitamin D 600 .... Once Daily  Allergies (verified): 1)  ! Penicillin 2)  ! * ? Flu Shot 3)  Tetanus-Diphtheria Toxoids Td (Tetanus-Diphtheria Toxoids Td)  Physical Exam  General:  alert, well-developed, and overweight-appearing.  no apparent distress Head:  face symmetric, no swollen , no rash Neck:   no lymphadenopathies Lungs:  normal respiratory effort, no intercostal retractions, no accessory muscle use, and normal breath sounds.   Heart:  normal rate, regular rhythm, no murmur, and no gallop.   Extremities:  no lower extremity edema Right deltoid area normal Left deltoid area with a 4x3 cms area with slight redness, tenderness and warmness. No fluctuancy, 00 no discharge Psych:  not anxious appearing and not depressed appearing.     Impression & Recommendations:  Problem # 1:  ADVERSE DRUG REACTION (ICD-995.20) the patient is obviously having reaction to one of the shots. There is no local reaction to the flu shot, rather the area   where she received the Td  is red - warm and tender. I more inclined to think that she is having a reaction to the tetanus shot but I cannot be completely sure b/c fever is seen more frecunetly after a flu  shot   plan: for now, I would recommend no further tetanus shot. I think it would be okay to try again  the flu shot  in the following years since this has not been a life threatening reaction.    Complete Medication List: 1)  Vytorin 10-10 Mg Tabs (Ezetimibe-simvastatin) .Marland Kitchen.. 1 tab daily 2)  Metoprolol Tartrate 50 Mg Tabs (Metoprolol tartrate) .Marland Kitchen.. 1 twice daily 3)  Aciphex 20 Mg Tbec (Rabeprazole sodium) .... Take  one tablet every day 4)  Hydrochlorothiazide 25 Mg Tabs (Hydrochlorothiazide) .... Take one tablet daily 5)  Zantac 150 Mg Caps (Ranitidine hcl) .Marland Kitchen.. 1 by mouth once daily as needed 6)  Advocate Redi-code Devi (Blood glucose monitoring suppl) .... Dx 250.00 7)  Advocate Redi-code Strp (Glucose blood) .... Checks blood sugar 1x/day dx 250.00 8)  Advocate Lancets Misc (Lancets) .... Check bs 1x/day dx 250.00 9)  Calcium 1gram  .... Two times a day 10)  Vitamin D 600  .... Once daily  Patient Instructions: 1)  rest, fluids 2)  Tylenol 500 mg 2 tablets every 8 hours for 2 days, then as needed 3)  Benadryl twice a day  (over-the-counter) for 3 days 4)  ice to the L shoulder  5)  call anytime if you develop a  rash anywhere, you have fever for more than 2 days or you if you get  worse 6)      Orders Added: 1)  Est. Patient Level III [[address]]

## 2010-05-19 NOTE — Letter (Signed)
Summary: neg eye exam----[facility]   Imported By: Lanelle Bal 05/26/2009 13:26:49  _____________________________________________________________________  External Attachment:    Type:   Image     Comment:   External Document

## 2010-05-19 NOTE — Assessment & Plan Note (Signed)
Summary: 4 month ov//ph   Vital Signs:  Patient profile:   65 year old female Height:      63 inches Weight:      227.8 pounds BMI:     40.50 Pulse rate:   60 / minute BP sitting:   134 / 72  Vitals Entered By: Shary Decamp (June 11, 2009 11:23 AM) CC: rov Is Patient Diabetic? Yes Comments  - pt request vit d level, was rx'd vit D 50,000 x 12 weeks (03/2009) by gyn  - FBS @ home 120-140  - BP elevated last week 160-180/90 Shary Decamp  June 11, 2009 11:23 AM    History of Present Illness: ROV  s/p  Vit D Rx   x 3 months, per gyn had a dexa at gyn as well  likes vit D checked and faxed to them     Current Medications (verified): 1)  Vytorin 10-10 Mg Tabs (Ezetimibe-Simvastatin) .Marland Kitchen.. 1 Tab Daily 2)  Metoprolol Tartrate 50 Mg  Tabs (Metoprolol Tartrate) .Marland Kitchen.. 1 Twice Daily 3)  Aciphex 20 Mg  Tbec (Rabeprazole Sodium) .... Take One Tablet Every Day 4)  Hydrochlorothiazide 25 Mg  Tabs (Hydrochlorothiazide) .... Take One Tablet Daily 5)  Zantac 150 Mg  Caps (Ranitidine Hcl) .Marland Kitchen.. 1 By Mouth Once Daily As Needed 6)  Advocate Redi-Code  Devi (Blood Glucose Monitoring Suppl) .... Dx 250.00 7)  Advocate Redi-Code  Strp (Glucose Blood) .... Checks Blood Sugar 1x/day Dx 250.00 8)  Advocate Lancets  Misc (Lancets) .... Check Bs 1x/day Dx 250.00 9)  Calcium 600 .... Bid 10)  Vitamin D 400 .... Once Daily  Allergies (verified): 1)  ! Penicillin  Past History:  Past Medical History: Hyperlipidemia Hypertension GERD Diabetes mellitus, type II  Past Surgical History: Reviewed history from 05/27/2008 and no changes required. no  Social History: Reviewed history from 01/23/2008 and no changes required. Married 3 children  original  [place]  Review of Systems       unable to take ASA , not even twice a week  Hyperlipidemia-- good medication compliance  Hypertension-- ambulatory BPs ok except x 3 days last week , denies increae salt intake, admits to stress    diabetes-- ambulatory CBGs < 140  Physical Exam  General:  alert, well-developed, and overweight-appearing.   Lungs:  normal respiratory effort, no intercostal retractions, no accessory muscle use, and normal breath sounds.   Heart:  normal rate, regular rhythm, and no murmur.     Impression & Recommendations:  Problem # 1:  VITAMIN D DEFICIENCY (ICD-268.9) labs, fax to gyn  Orders: T-Vitamin D (25-Hydroxy) ([address])  Problem # 2:  DIABETES MELLITUS, TYPE II (ICD-250.00) encouraged diet-exercise , labs  Orders: Venipuncture ([address]) TLB-A1C / Hgb A1C (Glycohemoglobin) (83036-A1C)  Labs Reviewed: Creat: 0.7 (01/28/2009)     Last Eye Exam: normal (05/01/2009) Reviewed HgBA1c results: 6.4 (01/28/2009)  6.5 (09/24/2008)  Problem # 3:  HYPERTENSION (ICD-401.9) at goal today Her updated medication list for this problem includes:    Metoprolol Tartrate 50 Mg Tabs (Metoprolol tartrate) .Marland Kitchen... 1 twice daily    Hydrochlorothiazide 25 Mg Tabs (Hydrochlorothiazide) .Marland Kitchen... Take one tablet daily  BP today: 134/72 Prior BP: 132/80 (01/28/2009)  Labs Reviewed: K+: 3.6 (01/28/2009) Creat: : 0.7 (01/28/2009)   Chol: 134 (09/24/2008)   HDL: 47.40 (09/24/2008)   LDL: 73 (09/24/2008)   TG: 68.0 (09/24/2008)  Complete Medication List: 1)  Vytorin 10-10 Mg Tabs (Ezetimibe-simvastatin) .Marland Kitchen.. 1 tab daily 2)  Metoprolol Tartrate 50 Mg Tabs (  Metoprolol tartrate) .Marland Kitchen.. 1 twice daily 3)  Aciphex 20 Mg Tbec (Rabeprazole sodium) .... Take one tablet every day 4)  Hydrochlorothiazide 25 Mg Tabs (Hydrochlorothiazide) .... Take one tablet daily 5)  Zantac 150 Mg Caps (Ranitidine hcl) .Marland Kitchen.. 1 by mouth once daily as needed 6)  Advocate Redi-code Devi (Blood glucose monitoring suppl) .... Dx 250.00 7)  Advocate Redi-code Strp (Glucose blood) .... Checks blood sugar 1x/day dx 250.00 8)  Advocate Lancets Misc (Lancets) .... Check bs 1x/day dx 250.00 9)  Calcium 600  .... Bid 10)  Vitamin D 400  ....  Once daily  Patient Instructions: 1)  Please schedule a follow-up appointment in 4 months .

## 2010-05-19 NOTE — Progress Notes (Signed)
Summary: Returning Call  Phone Note Call from Patient Call back at Cell [phone]    Caller: Patient Call For: [facility] E. Paz MD Summary of Call: Returning call about lab results.  Please call cell to talk to patient, she will not be available at the home phone. Initial call taken by: Barnie Mort,  October 17, 2009 8:54 AM  Follow-up for Phone Call        see append to labs. Army Fossa CMA  October 17, 2009 11:34 AM

## 2010-05-19 NOTE — Progress Notes (Signed)
Summary: pt status   unable to reach pt. left several messages. Army Fossa CMA  February 20, 2010 2:43 PM  ---- Converted from flag ---- ---- 02/19/2010 3:58 PM, Army Fossa CMA wrote: left message for pt to call back.  ---- 02/18/2010 4:07 PM, Army Fossa CMA wrote: left message for pt to call back.   ---- 02/16/2010 11:46 AM, Army Fossa CMA wrote:  left message for pt to call back.  ---- 02/11/2010 6:09 PM, Jose E. Paz MD wrote: pleaase check on the patient, feeling better ?? ------------------------------

## 2010-05-19 NOTE — Assessment & Plan Note (Signed)
Summary: fu/kdc   Vital Signs:  Patient profile:   65 year old female Height:      63 inches Weight:      227 pounds Pulse rate:   62 / minute BP sitting:   132 / 80  Vitals Entered By: Shary Decamp (January 28, 2009 11:13 AM) CC: rov Comments  - pt stopped ASA because it "upset her stomach" ....Marland KitchenMarland KitchenShary Decamp  January 28, 2009 11:18 AM    History of Present Illness: see last OV, no further stroke -like symptoms  w/u neg , discussed w/ patient unable to take ASA once daily but can take 1/ week   ok to take tylenol III? (as Rx by her dentist)---yes   Current Medications (verified): 1)  Vytorin 10-10 Mg Tabs (Ezetimibe-Simvastatin) .Marland Kitchen.. 1 Tab Daily 2)  Metoprolol Tartrate 50 Mg  Tabs (Metoprolol Tartrate) .Marland Kitchen.. 1 Twice Daily 3)  Aciphex 20 Mg  Tbec (Rabeprazole Sodium) .... Take One Tablet Every Day 4)  Hydrochlorothiazide 25 Mg  Tabs (Hydrochlorothiazide) .... Take One Tablet Daily 5)  Zantac 150 Mg  Caps (Ranitidine Hcl) .Marland Kitchen.. 1 By Mouth Once Daily As Needed 6)  Advocate Redi-Code  Devi (Blood Glucose Monitoring Suppl) .... Dx 250.00 7)  Advocate Redi-Code  Strp (Glucose Blood) .... Checks Blood Sugar 1x/day Dx 250.00 8)  Advocate Lancets  Misc (Lancets) .... Check Bs 1x/day Dx 250.00 9)  Estrace 0.1 Mg/gm Crea (Estradiol) .... As Directed  Allergies (verified): 1)  ! Penicillin  Past History:  Past Medical History: Hyperlipidemia Hypertension GERD Diabetes mellitus, type II  Past Surgical History: Reviewed history from 05/27/2008 and no changes required. no  Social History: Reviewed history from 01/23/2008 and no changes required. Married 3 children  original  [place]  Review of Systems CV:  Denies chest pain or discomfort and shortness of breath with exertion; ambulatory BPs 130s/79 most of the time  feet swelling "all my life" @ baseline . Endo:  ambulatory CBGs > 139, sometimes in the low 100s  diet-- good  exercise -- not much.  Physical  Exam  General:  alert, well-developed, and overweight-appearing.   Lungs:  normal respiratory effort, no intercostal retractions, no accessory muscle use, and normal breath sounds.   Heart:  normal rate, regular rhythm, and no murmur.   Extremities:  no edema Neurologic:  speach and motor normal  Psych:  Cognition and judgment appear intact. Alert and cooperative with normal attention span and concentration    Impression & Recommendations:  Problem # 1:  ? of CVA (ICD-434.91) w/u neg, asx at this point intolerant ot ASA? we agreed to try aspirin at least  twice  a week The following medications were removed from the medication list:    Aspirin 81 Mg Tbec (Aspirin) .Marland Kitchen... 1 by mouth once daily  Problem # 2:  DIABETES MELLITUS, TYPE II (ICD-250.00)  Labs Reviewed: Creat: 0.7 (05/27/2008)     Last Eye Exam: normal (04/19/2008) Reviewed HgBA1c results: 6.5 (09/24/2008)  6.6 (05/27/2008)  Orders: Venipuncture ([address]) TLB-A1C / Hgb A1C (Glycohemoglobin) (83036-A1C)  Problem # 3:  HYPERTENSION (ICD-401.9) at goal  Her updated medication list for this problem includes:    Metoprolol Tartrate 50 Mg Tabs (Metoprolol tartrate) .Marland Kitchen... 1 twice daily    Hydrochlorothiazide 25 Mg Tabs (Hydrochlorothiazide) .Marland Kitchen... Take one tablet daily    BP today: 132/80 Prior BP: 122/80 (09/24/2008)  Labs Reviewed: K+: 3.9 (05/27/2008) Creat: : 0.7 (05/27/2008)   Chol: 134 (09/24/2008)   HDL: 47.40 (09/24/2008)  LDL: 73 (09/24/2008)   TG: 68.0 (09/24/2008)  Orders: TLB-BMP (Basic Metabolic Panel-BMET) (80048-METABOL)  Problem # 4:  HYPERLIPIDEMIA (ICD-272.4) at goal  Her updated medication list for this problem includes:    Vytorin 10-10 Mg Tabs (Ezetimibe-simvastatin) .Marland Kitchen... 1 tab daily    Labs Reviewed: SGOT: 17 (05/27/2008)   SGPT: 14 (05/27/2008)   HDL:47.40 (09/24/2008), 46.9 (10/03/2007)  LDL:73 (09/24/2008), 82 (10/03/2007)  Chol:134 (09/24/2008), 143 (10/03/2007)  Trig:68.0  (09/24/2008), 73 (10/03/2007)  Orders: TLB-ALT (SGPT) (84460-ALT) TLB-AST (SGOT) (84450-SGOT)  Problem # 5:  states will have a flu shot next week  Complete Medication List: 1)  Vytorin 10-10 Mg Tabs (Ezetimibe-simvastatin) .Marland Kitchen.. 1 tab daily 2)  Metoprolol Tartrate 50 Mg Tabs (Metoprolol tartrate) .Marland Kitchen.. 1 twice daily 3)  Aciphex 20 Mg Tbec (Rabeprazole sodium) .... Take one tablet every day 4)  Hydrochlorothiazide 25 Mg Tabs (Hydrochlorothiazide) .... Take one tablet daily 5)  Zantac 150 Mg Caps (Ranitidine hcl) .Marland Kitchen.. 1 by mouth once daily as needed 6)  Advocate Redi-code Devi (Blood glucose monitoring suppl) .... Dx 250.00 7)  Advocate Redi-code Strp (Glucose blood) .... Checks blood sugar 1x/day dx 250.00 8)  Advocate Lancets Misc (Lancets) .... Check bs 1x/day dx 250.00 9)  Estrace 0.1 Mg/gm Crea (Estradiol) .... As directed  Patient Instructions: 1)  Please schedule a follow-up appointment in 4 months .

## 2010-05-25 ENCOUNTER — Encounter: Payer: Self-pay | Admitting: Internal Medicine

## 2010-06-10 NOTE — Letter (Signed)
Summary: negative Eye Exam-----[facility]  Eye Exam/[facility]   Imported By: Maryln Gottron 05/29/2010 13:58:54  _____________________________________________________________________  External Attachment:    Type:   Image     Comment:   External Document

## 2010-06-23 ENCOUNTER — Encounter: Payer: Self-pay | Admitting: Internal Medicine

## 2010-06-25 ENCOUNTER — Encounter: Payer: Self-pay | Admitting: Internal Medicine

## 2010-08-11 ENCOUNTER — Other Ambulatory Visit (HOSPITAL_COMMUNITY)
Admission: RE | Admit: 2010-08-11 | Discharge: 2010-08-11 | Disposition: A | Discharge status: Home / Self Care | Payer: 59 | Source: Ambulatory Visit | Attending: Internal Medicine | Admitting: Internal Medicine

## 2010-08-11 ENCOUNTER — Encounter: Payer: Self-pay | Admitting: Internal Medicine

## 2010-08-11 ENCOUNTER — Ambulatory Visit (INDEPENDENT_AMBULATORY_CARE_PROVIDER_SITE_OTHER): Payer: 59 | Admitting: Internal Medicine

## 2010-08-11 VITALS — BP 130/80 | HR 60 | Ht 63.0 in | Wt 227.6 lb

## 2010-08-11 DIAGNOSIS — K219 Gastro-esophageal reflux disease without esophagitis: Secondary | ICD-10-CM

## 2010-08-11 DIAGNOSIS — Z0000 Encounter for general adult medical examination without abnormal findings: Secondary | ICD-10-CM | POA: Insufficient documentation

## 2010-08-11 DIAGNOSIS — E119 Type 2 diabetes mellitus without complications: Secondary | ICD-10-CM

## 2010-08-11 DIAGNOSIS — Z01419 Encounter for gynecological examination (general) (routine) without abnormal findings: Secondary | ICD-10-CM | POA: Insufficient documentation

## 2010-08-11 LAB — LIPID PANEL
Cholesterol: 154 mg/dL (ref 0–200)
HDL: 52 mg/dL (ref >39.00)
Triglycerides: 91 mg/dL (ref 0.0–149.0)
VLDL: 18.2 mg/dL (ref 0.0–40.0)

## 2010-08-11 LAB — BASIC METABOLIC PANEL
CO2: 32 mEq/L (ref 19–32)
Calcium: 9.5 mg/dL (ref 8.4–10.5)
Chloride: 100 mEq/L (ref 96–112)
Creatinine, Ser: 0.7 mg/dL (ref 0.4–1.2)
Sodium: 140 mEq/L (ref 135–145)

## 2010-08-11 LAB — MICROALBUMIN / CREATININE URINE RATIO
Microalb Creat Ratio: 1.9 mg/g (ref 0.0–30.0)
Microalb, Ur: 0.5 mg/dL (ref 0.0–1.9)

## 2010-08-11 LAB — CBC WITH DIFFERENTIAL/PLATELET
Basophils Absolute: 0 10*3/uL (ref 0.0–0.1)
HCT: 37.5 % (ref 36.0–46.0)
Lymphs Abs: 1.9 10*3/uL (ref 0.7–4.0)
Monocytes Absolute: 0.3 10*3/uL (ref 0.1–1.0)
Monocytes Relative: 6.3 % (ref 3.0–12.0)
Neutrophils Relative %: 53.1 % (ref 43.0–77.0)
RBC: 4.44 Mil/uL (ref 3.87–5.11)
WBC: 4.8 10*3/uL (ref 4.5–10.5)

## 2010-08-11 LAB — ALT: ALT: 22 U/L (ref 0–35)

## 2010-08-11 NOTE — Progress Notes (Signed)
  Subjective:    Patient ID: Casey Bryan, female    DOB: 26-Nov-1945, 65 y.o.   MRN: [id number]  HPI CPX H/o hemorrhoids: occ mild bleed, occ increase in size and pain Past Medical History  Diagnosis Date  . Hyperlipemia   . Hypertension   . GERD (gastroesophageal reflux disease)   . Diabetes mellitus    No past surgical history on file. Family History  Problem Relation Age of Onset  . Diabetes      uncles, mother , father   . Colon cancer Neg Hx   . Breast cancer Neg Hx   . Coronary artery disease      F age 109, B age 39, uncle age 57 : MI   History   Social History  . Marital Status: Married    Spouse Name: N/A    Number of Children: 3  . Years of Education: N/A   Occupational History  . own busines, accountant    Social History Main Topics  . Smoking status: Never Smoker   . Smokeless tobacco: Not on file  . Alcohol Use: Yes     socially  . Drug Use: No  . Sexually Active: Not on file   Other Topics Concern  . Not on file   Social History Narrative   Original [place] .... Diet-- trying to eat healthy....  Exercise--takes walks x2 or 3 /week     Review of Systems  Constitutional: Negative for fever. Fatigue: occ fatigue at the end of the day.  Respiratory: Negative for cough, shortness of breath and wheezing.   Cardiovascular: Positive for leg swelling (chronic edema , mild, well controlled). Negative for chest pain.  Gastrointestinal: Negative for nausea and diarrhea. Abdominal pain: occ abd dyscomfort.  Genitourinary: Negative for dysuria, hematuria, vaginal bleeding and vaginal discharge.       Occasionally does SBE, normal       Objective:   Physical Exam  Constitutional: She appears well-developed. No distress.  HENT:  Head: Normocephalic and atraumatic.  Eyes: No scleral icterus.  Neck: Normal range of motion. Neck supple. No thyromegaly present.  Cardiovascular: Normal rate, regular rhythm and normal heart sounds.   No murmur  heard. Pulmonary/Chest: Effort normal and breath sounds normal. No respiratory distress. She has no wheezes. She has no rales.  Abdominal: Soft. Bowel sounds are normal. She exhibits no distension. There is no tenderness. There is no rebound.  Genitourinary: Vagina normal. No breast swelling, tenderness, discharge or bleeding. There is no rash, tenderness, lesion or injury on the right labia. There is no rash, tenderness, lesion or injury on the left labia. Cervix exhibits no motion tenderness and no discharge. No erythema, tenderness or bleeding around the vagina. No vaginal discharge found.  DRE: She has multiple external hemorrhoids. The internal examination was limited by patient discomfort. At around 6:00 oclock,  2cm from the anus ,she has a small lesion, polyp versus a skin intact       Assessment & Plan:

## 2010-08-11 NOTE — Assessment & Plan Note (Signed)
Reports a EGD w/ Dr Randa Evens ~2008, Rx aciphex (?gastritis) Will try to get records

## 2010-08-11 NOTE — Assessment & Plan Note (Addendum)
Td 2011 Pneumonia shot 2008 Shingles shot info provided Used to see gynecology but likes to start doing her Pap smears here. Never had an abnormal PAP but had a (?) polyp removed ~ 2000 and 2009 per pt, benign. Pap sent breast exam done today, SBE encouraged  Mammogram-- ordered Reportedly, had a colonoscopy with Dr. Randa Evens around 2008, reportedly normal, next in 10 years, will get records In addition to external hemorrhoids, I also felt a rectal polyp versus a skin tag. Recommend to be reassesed by Dr Randa Evens, will refer. She may need a flex sigmoidoscopy versus another colonoscopy. Diet , exercise discussed  Reluctant to take Ca and vit D

## 2010-08-12 ENCOUNTER — Telehealth: Payer: Self-pay | Admitting: *Deleted

## 2010-08-12 DIAGNOSIS — Z1231 Encounter for screening mammogram for malignant neoplasm of breast: Secondary | ICD-10-CM

## 2010-08-12 NOTE — Telephone Encounter (Signed)
Ordered mammo 

## 2010-08-12 NOTE — Telephone Encounter (Signed)
Please discuss w/ EPIC team

## 2010-08-12 NOTE — Telephone Encounter (Signed)
Unsure how to enter Referral for Mammo.

## 2010-08-13 ENCOUNTER — Telehealth: Payer: Self-pay | Admitting: *Deleted

## 2010-08-13 MED ORDER — VITAMIN D (ERGOCALCIFEROL) 1.25 MG (50000 UNIT) PO CAPS: 50000.0000 [IU] | ORAL_CAPSULE | ORAL | Status: DC

## 2010-08-13 NOTE — Telephone Encounter (Signed)
Message copied by [employer] Fossa on Thu Aug 13, 2010  9:27 AM ------      Message from: Casey Bryan      Created: Wed Aug 12, 2010  5:03 PM       Advise patient:      Her cholesterol is very good, her diabetes is well controlled.      Her vitamin D is low, I recommend ergocalciferol 50,000 units weekly for 3 months, please call a prescription. After ergocalciferol, she needs to remain in over-the-counter calcium and vitamin D daily. If patient is reluctant to take vitamin D or calcium I will address that with her when she comes back.      Other results WNL

## 2010-08-13 NOTE — Telephone Encounter (Signed)
I spoke w/ pt she is aware.  

## 2010-08-20 ENCOUNTER — Ambulatory Visit
Admission: RE | Admit: 2010-08-20 | Discharge: 2010-08-20 | Disposition: A | Discharge status: Home / Self Care | Payer: 59 | Source: Ambulatory Visit | Attending: Internal Medicine | Admitting: Internal Medicine

## 2010-08-20 DIAGNOSIS — Z1231 Encounter for screening mammogram for malignant neoplasm of breast: Secondary | ICD-10-CM

## 2010-08-21 ENCOUNTER — Other Ambulatory Visit: Payer: Self-pay | Admitting: Internal Medicine

## 2010-10-08 ENCOUNTER — Ambulatory Visit (INDEPENDENT_AMBULATORY_CARE_PROVIDER_SITE_OTHER): Payer: 59 | Admitting: Family Medicine

## 2010-10-08 VITALS — BP 150/80 | Temp 99.9°F | Wt 227.0 lb

## 2010-10-08 DIAGNOSIS — J329 Chronic sinusitis, unspecified: Secondary | ICD-10-CM

## 2010-10-08 DIAGNOSIS — J3489 Other specified disorders of nose and nasal sinuses: Secondary | ICD-10-CM

## 2010-10-08 MED ORDER — BENZONATATE 200 MG PO CAPS: 200.0000 mg | ORAL_CAPSULE | Freq: Three times a day (TID) | ORAL | Status: AC | PRN

## 2010-10-08 MED ORDER — CLARITHROMYCIN ER 500 MG PO TB24: 1000.0000 mg | ORAL_TABLET | Freq: Every day | ORAL | Status: AC

## 2010-10-08 NOTE — Patient Instructions (Signed)
This is likely a sinus infection Take the biaxin as directed- take w/ food to avoid upset stomach Use the tessalon as needed for daytime cough Add Mucinex to thin your congestion Call with any questions or concerns Hang in there!!!

## 2010-10-08 NOTE — Progress Notes (Signed)
  Subjective:    Patient ID: Casey Bryan, female    DOB: 1946-02-03, 65 y.o.   MRN: [id number]  HPI ? Sinus infxns- sxs started 2 days ago.  sxs started w/ sore throat.  Family members currently w/ sinus infxns.  +facial pain/pressure, ear pressure.  + low grade temps.  + cough- productive of white sputum.   Review of Systems For ROS see HPI     Objective:   Physical Exam  Constitutional: She appears well-developed and well-nourished. No distress.  HENT:  Head: Normocephalic and atraumatic.  Right Ear: Tympanic membrane normal.  Left Ear: Tympanic membrane normal.  Nose: Mucosal edema and rhinorrhea present. Right sinus exhibits maxillary sinus tenderness and frontal sinus tenderness. Left sinus exhibits maxillary sinus tenderness and frontal sinus tenderness.  Mouth/Throat: Uvula is midline and mucous membranes are normal. Posterior oropharyngeal erythema present. No oropharyngeal exudate.  Eyes: Conjunctivae and EOM are normal. Pupils are equal, round, and reactive to light.  Neck: Normal range of motion. Neck supple.  Cardiovascular: Normal rate, regular rhythm and normal heart sounds.   Pulmonary/Chest: Effort normal and breath sounds normal. No respiratory distress. She has no wheezes.       + hacking cough  Lymphadenopathy:    She has no cervical adenopathy.          Assessment & Plan:

## 2010-10-20 DIAGNOSIS — J329 Chronic sinusitis, unspecified: Secondary | ICD-10-CM | POA: Insufficient documentation

## 2010-10-20 NOTE — Assessment & Plan Note (Signed)
Pt's hx and PE consistent w/ infxn.  Start abx.  Reviewed supportive care and red flags that should prompt return.  Pt expressed understanding and is in agreement w/ plan.  

## 2010-11-17 ENCOUNTER — Other Ambulatory Visit: Payer: Self-pay | Admitting: Internal Medicine

## 2010-12-10 ENCOUNTER — Ambulatory Visit (INDEPENDENT_AMBULATORY_CARE_PROVIDER_SITE_OTHER): Payer: 59 | Admitting: Internal Medicine

## 2010-12-10 ENCOUNTER — Encounter: Payer: Self-pay | Admitting: Internal Medicine

## 2010-12-10 DIAGNOSIS — E559 Vitamin D deficiency, unspecified: Secondary | ICD-10-CM

## 2010-12-10 DIAGNOSIS — I10 Essential (primary) hypertension: Secondary | ICD-10-CM

## 2010-12-10 DIAGNOSIS — E119 Type 2 diabetes mellitus without complications: Secondary | ICD-10-CM

## 2010-12-10 DIAGNOSIS — E785 Hyperlipidemia, unspecified: Secondary | ICD-10-CM

## 2010-12-10 LAB — HEMOGLOBIN A1C: Hgb A1c MFr Bld: 6.7 % — ABNORMAL HIGH (ref 4.6–6.5)

## 2010-12-10 NOTE — Patient Instructions (Signed)
Check the  blood pressure 2 or 3 times a week, be sure it is less than 140/85. If it is consistently higher, let me know Vitamin D OTC around 600 units a day

## 2010-12-10 NOTE — Progress Notes (Signed)
  Subjective:    Patient ID: Casey Bryan, female    DOB: June 19, 1945, 65 y.o.   MRN: [id number]  HPI Routine office visit Vitamin D deficiency--status post ergocalciferol. good tolerance and compliance Diabetes--on no medicines, diet has improved some, not exercising much. amb blood sugars around 113-120. Rarely they have got up to 138. Question of a rectal polyp few  months ago when I saw her, went to GI, note reviewed, they diagnosed her with internal hemorrhoids  Past Medical History  Diagnosis Date  . Hyperlipemia   . Hypertension   . GERD (gastroesophageal reflux disease)   . Diabetes mellitus    No past surgical history on file.   Review of Systems No chest pain or shortness of breath No nausea, vomiting, diarrhea Medication list reviewed with the patient, good compliance.     Objective:   Physical Exam  Constitutional: She is oriented to person, place, and time. She appears well-developed. No distress.       Overweight appearing  Cardiovascular: Normal rate, regular rhythm and normal heart sounds.   No murmur heard. Pulmonary/Chest: Effort normal and breath sounds normal. No respiratory distress. She has no wheezes. She has no rales.  Musculoskeletal:       DIABETIC FEET EXAM: No lower extremity edema Normal pedal pulses bilaterally Skin and nails are normal , B plantar calluses at base of 3th toes Pinprick examination of the feet normal.   Neurological: She is alert and oriented to person, place, and time.  Skin: She is not diaphoretic.  Psychiatric: She has a normal mood and affect. Her behavior is normal. Thought content normal.          Assessment & Plan:

## 2010-12-10 NOTE — Assessment & Plan Note (Signed)
BP today 142/78, see instructions

## 2010-12-10 NOTE — Assessment & Plan Note (Addendum)
Discussed diet, exercise, eye care yearly Feet exam today 12-10-10 neg, does have some calluses . Info about feet care provided  Labs

## 2010-12-10 NOTE — Assessment & Plan Note (Addendum)
S/p ergocalciferol , rec OTC vit D 600 u  Labs  Intolerant to calcium supplements due to constipation, recommend a healthy diet which should provide enough Ca

## 2010-12-10 NOTE — Assessment & Plan Note (Signed)
Well-controlled per last lipid profile, no change

## 2010-12-11 LAB — VITAMIN D 25 HYDROXY (VIT D DEFICIENCY, FRACTURES): Vit D, 25-Hydroxy: 32 ng/mL (ref 30–89)

## 2011-01-24 ENCOUNTER — Other Ambulatory Visit: Payer: Self-pay | Admitting: Internal Medicine

## 2011-01-25 NOTE — Telephone Encounter (Signed)
Done

## 2011-04-29 ENCOUNTER — Other Ambulatory Visit: Payer: Self-pay | Admitting: Internal Medicine

## 2011-05-03 ENCOUNTER — Telehealth: Payer: Self-pay | Admitting: Internal Medicine

## 2011-05-03 NOTE — Telephone Encounter (Signed)
INITIATED PRIOR AUTH PER INSURANCE COMPANY REJECTED BECAUSE PT MUST USE [facility] FOR A 90 DAY SUPPLY & RX WAS SENT TO LOCAL PHARMACY.  PLEASE SEE IF PT WANTS RX SENT TO [facility].

## 2011-05-04 MED ORDER — RABEPRAZOLE SODIUM 20 MG PO TBEC: 20.0000 mg | DELAYED_RELEASE_TABLET | Freq: Every day | ORAL | Status: DC

## 2011-05-04 NOTE — Telephone Encounter (Signed)
Rx sent, Pt made aware by [facility]

## 2011-05-12 ENCOUNTER — Encounter: Payer: Self-pay | Admitting: Internal Medicine

## 2011-05-12 ENCOUNTER — Ambulatory Visit (INDEPENDENT_AMBULATORY_CARE_PROVIDER_SITE_OTHER): Payer: 59 | Admitting: Internal Medicine

## 2011-05-12 VITALS — BP 136/84 | HR 52 | Temp 97.9°F | Resp 14 | Wt 226.2 lb

## 2011-05-12 DIAGNOSIS — I10 Essential (primary) hypertension: Secondary | ICD-10-CM

## 2011-05-12 DIAGNOSIS — E119 Type 2 diabetes mellitus without complications: Secondary | ICD-10-CM

## 2011-05-12 LAB — CBC WITH DIFFERENTIAL/PLATELET
Basophils Relative: 0.4 % (ref 0.0–3.0)
Eosinophils Relative: 1.1 % (ref 0.0–5.0)
HCT: 38 % (ref 36.0–46.0)
Lymphs Abs: 1.9 10*3/uL (ref 0.7–4.0)
MCV: 84.8 fl (ref 78.0–100.0)
Monocytes Absolute: 0.3 10*3/uL (ref 0.1–1.0)
Neutrophils Relative %: 55.8 % (ref 43.0–77.0)
RBC: 4.48 Mil/uL (ref 3.87–5.11)
WBC: 5.2 10*3/uL (ref 4.5–10.5)

## 2011-05-12 NOTE — Progress Notes (Signed)
  Subjective:    Patient ID: Casey Bryan, female    DOB: 05/07/1945, 66 y.o.   MRN: [id number]  HPI Routine office visit, here with her daughter  Past Medical History: Diabetes mellitus Hyperlipidemia Hypertension GERD  Past Surgical History: no  Social History: Married 3 children  original  [place] tobacco--no ETOH-- socially   Review of Systems In general feels very well, ambulatory CBGs always less than 100. Good medication compliance with all BP meds. BP is usually in the 140s, occasionally readings have been in the 180s. GERD symptoms well controlled with PPIs, rarely needs H2 blockers. Declined to have a flu shot Likes a  CBC checked , like to be sure  she doesn't have anemia,   has occasionally a hemorrhoidal bleed       Objective:   Physical Exam  Constitutional: She is oriented to person, place, and time. She appears well-developed. No distress.       Overweight appearing  Cardiovascular: Regular rhythm and normal heart sounds.   No murmur heard. Pulmonary/Chest: Effort normal and breath sounds normal. No respiratory distress. She has no wheezes. She has no rales.  Musculoskeletal: She exhibits no edema.  Neurological: She is alert and oriented to person, place, and time.  Skin: She is not diaphoretic.      Assessment & Plan:

## 2011-05-12 NOTE — Patient Instructions (Signed)
Check the  blood pressure 2 or 3 times a week, be sure you use a large cuff that fits well in your arm and that you check after resting for 15 minutes. If it is consistently more than 140/85----> let me know

## 2011-05-12 NOTE — Assessment & Plan Note (Signed)
amb BPs elevated, BPs here wnl No change, see instructions

## 2011-05-12 NOTE — Assessment & Plan Note (Signed)
On diet only, labs 

## 2011-05-17 ENCOUNTER — Encounter: Payer: Self-pay | Admitting: Internal Medicine

## 2011-05-25 ENCOUNTER — Other Ambulatory Visit: Payer: Self-pay | Admitting: Internal Medicine

## 2011-05-25 NOTE — Telephone Encounter (Signed)
Refill done.  

## 2011-07-26 ENCOUNTER — Other Ambulatory Visit: Payer: Self-pay | Admitting: Internal Medicine

## 2011-07-26 NOTE — Telephone Encounter (Signed)
Refill done.  

## 2011-08-10 ENCOUNTER — Encounter: Payer: Self-pay | Admitting: Internal Medicine

## 2011-08-10 ENCOUNTER — Ambulatory Visit (INDEPENDENT_AMBULATORY_CARE_PROVIDER_SITE_OTHER): Payer: 59 | Admitting: Internal Medicine

## 2011-08-10 VITALS — BP 120/72 | HR 53 | Temp 97.7°F | Wt 221.0 lb

## 2011-08-10 DIAGNOSIS — E785 Hyperlipidemia, unspecified: Secondary | ICD-10-CM

## 2011-08-10 DIAGNOSIS — K219 Gastro-esophageal reflux disease without esophagitis: Secondary | ICD-10-CM

## 2011-08-10 DIAGNOSIS — I10 Essential (primary) hypertension: Secondary | ICD-10-CM

## 2011-08-10 DIAGNOSIS — K649 Unspecified hemorrhoids: Secondary | ICD-10-CM

## 2011-08-10 DIAGNOSIS — E119 Type 2 diabetes mellitus without complications: Secondary | ICD-10-CM

## 2011-08-10 LAB — BASIC METABOLIC PANEL
CO2: 29 mEq/L (ref 19–32)
Calcium: 9.1 mg/dL (ref 8.4–10.5)
Glucose, Bld: 130 mg/dL — ABNORMAL HIGH (ref 70–99)
Sodium: 137 mEq/L (ref 135–145)

## 2011-08-10 LAB — HEMOGLOBIN A1C: Hgb A1c MFr Bld: 6.9 % — ABNORMAL HIGH (ref 4.6–6.5)

## 2011-08-10 LAB — MICROALBUMIN / CREATININE URINE RATIO
Creatinine,U: 126.9 mg/dL
Microalb, Ur: 1.7 mg/dL (ref 0.0–1.9)

## 2011-08-10 LAB — LIPID PANEL
HDL: 38.6 mg/dL — ABNORMAL LOW (ref >39.00)
Triglycerides: 85 mg/dL (ref 0.0–149.0)
VLDL: 17 mg/dL (ref 0.0–40.0)

## 2011-08-10 MED ORDER — AZELASTINE HCL 0.1 % NA SOLN: 2.0000 | Freq: Two times a day (BID) | NASAL | Status: DC

## 2011-08-10 NOTE — Patient Instructions (Signed)
Rest, fluids , tylenol For cough, take Mucinex DM twice a day as needed  For congestion use astelin nasal spray twice a day until you feel better Call if no better in few days Call anytime if the symptoms are severe ------ Have your eyes checked for diabetes every year.

## 2011-08-10 NOTE — Assessment & Plan Note (Signed)
On diet control, labs 

## 2011-08-10 NOTE — Assessment & Plan Note (Addendum)
Long history of hemorrhoids, currently complains of on and off bleeding, sometimes as much as 100 cc a day. Also occasional pain. Chart reviewed, had a colonoscopy in 2008. She consult with GI 08/2010 and the patient elected conservative treatment. will check a hemoglobin to rule out anemia, recommend to see GI.

## 2011-08-10 NOTE — Assessment & Plan Note (Signed)
Good medication compliance, labs 

## 2011-08-10 NOTE — Assessment & Plan Note (Signed)
Well-controlled, no change 

## 2011-08-10 NOTE — Progress Notes (Signed)
  Subjective:    Patient ID: Casey Bryan, female    DOB: 12/28/1945, 66 y.o.   MRN: [id number]  HPI Routine office visit, we discussed the following issues: High cholesterol, good medication compliance with Vytorin. Hypertension, good medication compliance, ambulatory BPs within normal. History of diabetes, due for a hemoglobin A1c, low sugars at home usually 110, always less than 130. History of hemorrhoids, see assessment and plan. 9 days history of URI type of symptoms: Runny nose, headache, head > chest congestion. Had fever at least once, around 101.0. Has taken Robitussin and Tylenol.  Past Medical History: Diabetes mellitus Hyperlipidemia Hypertension GERD  Past Surgical History: no  Review of Systems Denies chest pain or shortness of breath. Lower extremity edema is at baseline. With the URI symptoms, she had some nausea, no vomiting. She also had diarrhea twice.     Objective:   Physical Exam General -- alert, well-developed, and overweight appearing. No apparent distress.  HEENT -- TMs normal, throat w/o redness, face symmetric and not tender to palpation, nose congested  Lungs -- normal respiratory effort, no intercostal retractions, no accessory muscle use, and normal breath sounds.   Heart-- normal rate, regular rhythm, no murmur, and no gallop.   Extremities--  Trace, symmetric pretibial edema bilaterally  Neurologic-- alert & oriented X3 and strength normal in all extremities. Psych-- Cognition and judgment appear intact. Alert and cooperative with normal attention span and concentration.  not anxious appearing and not depressed appearing.        Assessment & Plan:  URI, recommend conservative treatment. See instructions.

## 2011-08-10 NOTE — Assessment & Plan Note (Signed)
Good med compliance, asx

## 2011-08-12 ENCOUNTER — Encounter: Payer: Self-pay | Admitting: *Deleted

## 2011-10-24 ENCOUNTER — Other Ambulatory Visit: Payer: Self-pay | Admitting: Internal Medicine

## 2011-10-25 NOTE — Telephone Encounter (Signed)
Refill done.  

## 2011-11-27 ENCOUNTER — Other Ambulatory Visit: Payer: Self-pay | Admitting: Internal Medicine

## 2011-12-16 ENCOUNTER — Encounter: Payer: Self-pay | Admitting: Internal Medicine

## 2011-12-16 ENCOUNTER — Ambulatory Visit (INDEPENDENT_AMBULATORY_CARE_PROVIDER_SITE_OTHER): Payer: Medicare Other | Admitting: Internal Medicine

## 2011-12-16 VITALS — BP 130/80 | HR 50 | Temp 97.8°F | Ht 62.75 in | Wt 224.0 lb

## 2011-12-16 DIAGNOSIS — K219 Gastro-esophageal reflux disease without esophagitis: Secondary | ICD-10-CM | POA: Diagnosis not present

## 2011-12-16 DIAGNOSIS — E785 Hyperlipidemia, unspecified: Secondary | ICD-10-CM

## 2011-12-16 DIAGNOSIS — E119 Type 2 diabetes mellitus without complications: Secondary | ICD-10-CM

## 2011-12-16 DIAGNOSIS — Z0000 Encounter for general adult medical examination without abnormal findings: Secondary | ICD-10-CM

## 2011-12-16 DIAGNOSIS — I10 Essential (primary) hypertension: Secondary | ICD-10-CM

## 2011-12-16 DIAGNOSIS — Z1231 Encounter for screening mammogram for malignant neoplasm of breast: Secondary | ICD-10-CM

## 2011-12-16 DIAGNOSIS — E559 Vitamin D deficiency, unspecified: Secondary | ICD-10-CM | POA: Diagnosis not present

## 2011-12-16 LAB — HEMOGLOBIN A1C: Hgb A1c MFr Bld: 6.7 % — ABNORMAL HIGH (ref 4.6–6.5)

## 2011-12-16 LAB — MICROALBUMIN / CREATININE URINE RATIO
Creatinine,U: 52.2 mg/dL
Microalb Creat Ratio: 0.6 mg/g (ref 0.0–30.0)

## 2011-12-16 MED ORDER — ZOSTER VACCINE LIVE 19400 UNT/0.65ML ~~LOC~~ SOLR: 0.6500 mL | Freq: Once | SUBCUTANEOUS | Status: AC

## 2011-12-16 MED ORDER — RABEPRAZOLE SODIUM 20 MG PO TBEC: 20.0000 mg | DELAYED_RELEASE_TABLET | Freq: Every day | ORAL | Status: DC

## 2011-12-16 NOTE — Assessment & Plan Note (Signed)
BP seems to be well-controlled, last BMP okay, no change

## 2011-12-16 NOTE — Assessment & Plan Note (Signed)
On no medications, will check the A1c and a microalbumin. Discussed diet and exercise.

## 2011-12-16 NOTE — Progress Notes (Signed)
  Subjective:    Patient ID: Casey Bryan, female    DOB: 11-Apr-1946, 66 y.o.   MRN: [id number]  HPI Here for Medicare AWV, here with her daughter who was present during the entire visit:  1. Risk factors based on Past M, S, F history: reviewed 2. Physical Activities:  Active at home, occ walks ("not much") 3. Depression/mood:  No problemss noted or reported  4. Hearing:  No problemss noted or reported  5. ADL's:  independent 6. Fall Risk: prevention discussed  7. home Safety: does feel safe at home  8. Height, weight, &visual acuity: see VS, has early cataracts , plan to see her eye doctor 9. Counseling: provided 10. Labs ordered based on risk factors: if needed  11. Referral Coordination: if needed 12.  Care Plan, see assessment and plan  13.   Cognitive Assessment: Motor skills and cognition within normal  In addition, today we discussed the following: Diabetes, on no medications, ambulatory blood sugars range from 120-140. GERD, good medication compliance with PPIs, needs a refill. She also takes Zantac. High cholesterol, good medication compliance  Past Medical History: Diabetes mellitus Hyperlipidemia Hypertension GERD  Past Surgical History: No  F H CAD-- F, B x 2, sister  (CABG, stents), onset in their 51s Carotid Dz-- several fam members  DM-- late onset F M Colon ca-- cousin Breast ca--  no Multiple myeloma -- sister   Social History: Married, 3 children  original  [place] tobacco--no ETOH-- socially    Review of Systems In general doing well Denies any chest or shortness of breath No  nausea, vomiting, diarrhea. She occasionally sees blood in the stools, thinks related to hemorrhoids. She is up-to-date on her colonoscopies and sees GI regularly. Denies any dysuria, gross hematuria, vaginal bleeding or discharge.     Objective:   Physical Exam General -- alert, well-developed, and overweight appearing.   Neck --no thyromegaly , normal carotid  pulse Breasts-- No mass, nodules, thickening, tenderness, bulging, retraction, inflamation, nipple discharge or skin changes noted.  no axillary lymph nodes Lungs -- normal respiratory effort, no intercostal retractions, no accessory muscle use, and normal breath sounds.   Heart-- normal rate, regular rhythm, no murmur, and no gallop.   Abdomen--soft, non-tender, no distention, no masses, no HSM, no guarding, and no rigidity.   Extremities-- no pretibial edema bilaterally Neurologic-- alert & oriented X3 and strength normal in all extremities. Psych-- Cognition and judgment appear intact. Alert and cooperative with normal attention span and concentration.  not anxious appearing and not depressed appearing.      Assessment & Plan:

## 2011-12-16 NOTE — Assessment & Plan Note (Signed)
RF meds  

## 2011-12-16 NOTE — Patient Instructions (Addendum)
Exercise 30 minutes every day Get flu shot this season Think about Zostavax, the shingles shot Discuss with Dr. Randa Evens your next colonoscopy Come back in 6 months for a routine checkup

## 2011-12-16 NOTE — Assessment & Plan Note (Signed)
On no supplements of calcium or vitamin D. Plan: Encourage calcium, vitamin D, will check a vitamin D level

## 2011-12-16 NOTE — Assessment & Plan Note (Signed)
Good compliance with meds, recent FLP satisfactory. No change

## 2011-12-16 NOTE — Assessment & Plan Note (Addendum)
Td 2011 Pneumonia 2008 Zostavax benefits discussed, prescription provided cscope Dr Randa Evens ~ 2008, normal, next per GI, will see him soon, encouraged to discuss timing of next Cscope  MMG 4-12 neg, will schedule one PAP 07-2010 normal; no history of abnormal Paps before, she has been married once in her lifetime. She is very low risk. Next PAP 2015  Bone density test was done at gynecology 2 or 3 years ago, reportedly normal, no records. Recommend daily exercise, daily calcium and vitamin D. Has  a strong family history of heart disease, controlling her CV RF. Will recommend aspirin daily

## 2011-12-17 LAB — VITAMIN D 25 HYDROXY (VIT D DEFICIENCY, FRACTURES): Vit D, 25-Hydroxy: 24 ng/mL — ABNORMAL LOW (ref 30–89)

## 2011-12-21 ENCOUNTER — Telehealth: Payer: Self-pay | Admitting: Internal Medicine

## 2011-12-21 ENCOUNTER — Encounter: Payer: Self-pay | Admitting: *Deleted

## 2011-12-21 MED ORDER — ERGOCALCIFEROL 1.25 MG (50000 UT) PO CAPS: 50000.0000 [IU] | ORAL_CAPSULE | ORAL | Status: DC

## 2011-12-21 NOTE — Telephone Encounter (Signed)
Caller: Niveen/Child; Phone: [phone]; Reason for Call: Daughter returning missed call to office; please try her again at your earliest convenience.

## 2011-12-21 NOTE — Telephone Encounter (Signed)
Discussed with pt & sent in rx.  

## 2011-12-24 ENCOUNTER — Other Ambulatory Visit: Payer: Self-pay | Admitting: Internal Medicine

## 2011-12-24 NOTE — Telephone Encounter (Signed)
Refill done.  

## 2011-12-29 DIAGNOSIS — K219 Gastro-esophageal reflux disease without esophagitis: Secondary | ICD-10-CM | POA: Diagnosis not present

## 2011-12-29 DIAGNOSIS — K644 Residual hemorrhoidal skin tags: Secondary | ICD-10-CM | POA: Diagnosis not present

## 2011-12-29 DIAGNOSIS — K921 Melena: Secondary | ICD-10-CM | POA: Diagnosis not present

## 2011-12-29 DIAGNOSIS — R1013 Epigastric pain: Secondary | ICD-10-CM | POA: Diagnosis not present

## 2012-01-04 ENCOUNTER — Ambulatory Visit
Admission: RE | Admit: 2012-01-04 | Discharge: 2012-01-04 | Disposition: A | Discharge status: Home / Self Care | Payer: PRIVATE HEALTH INSURANCE | Source: Ambulatory Visit | Attending: Internal Medicine | Admitting: Internal Medicine

## 2012-01-04 DIAGNOSIS — Z1231 Encounter for screening mammogram for malignant neoplasm of breast: Secondary | ICD-10-CM

## 2012-01-14 ENCOUNTER — Other Ambulatory Visit: Payer: Self-pay | Admitting: Gastroenterology

## 2012-01-14 DIAGNOSIS — D126 Benign neoplasm of colon, unspecified: Secondary | ICD-10-CM | POA: Diagnosis not present

## 2012-01-14 DIAGNOSIS — K648 Other hemorrhoids: Secondary | ICD-10-CM | POA: Diagnosis not present

## 2012-01-14 DIAGNOSIS — D131 Benign neoplasm of stomach: Secondary | ICD-10-CM | POA: Diagnosis not present

## 2012-01-14 DIAGNOSIS — K2970 Gastritis, unspecified, without bleeding: Secondary | ICD-10-CM | POA: Diagnosis not present

## 2012-01-14 DIAGNOSIS — R195 Other fecal abnormalities: Secondary | ICD-10-CM | POA: Diagnosis not present

## 2012-01-14 DIAGNOSIS — K644 Residual hemorrhoidal skin tags: Secondary | ICD-10-CM | POA: Diagnosis not present

## 2012-01-14 DIAGNOSIS — K219 Gastro-esophageal reflux disease without esophagitis: Secondary | ICD-10-CM | POA: Diagnosis not present

## 2012-01-24 ENCOUNTER — Other Ambulatory Visit: Payer: Self-pay | Admitting: Internal Medicine

## 2012-01-24 NOTE — Telephone Encounter (Signed)
Casey Bryan @ pharmacy confirmed pt still has refills left on file.

## 2012-02-01 ENCOUNTER — Encounter: Payer: Self-pay | Admitting: Internal Medicine

## 2012-02-11 ENCOUNTER — Other Ambulatory Visit: Payer: Self-pay | Admitting: *Deleted

## 2012-02-11 MED ORDER — ADVOCATE LANCETS MISC: Status: DC

## 2012-02-11 NOTE — Telephone Encounter (Signed)
Refill done.  

## 2012-02-18 ENCOUNTER — Telehealth: Payer: Self-pay | Admitting: Internal Medicine

## 2012-02-18 NOTE — Telephone Encounter (Signed)
Spoke with mike at pharmacy. They received the fax i sent over yesterday for the supplies. The pt just needs to bring her medicare b card to the pharmacy to pick them up. i left msg on pt's vmail to let her know.

## 2012-02-18 NOTE — Telephone Encounter (Signed)
Request received from [id number] for advocate lancets misc. Pt wants BD 33 gauge lancets.

## 2012-02-23 ENCOUNTER — Other Ambulatory Visit: Payer: Self-pay | Admitting: Internal Medicine

## 2012-02-23 NOTE — Telephone Encounter (Signed)
Refill done.  

## 2012-04-04 DIAGNOSIS — M26609 Unspecified temporomandibular joint disorder, unspecified side: Secondary | ICD-10-CM | POA: Diagnosis not present

## 2012-04-19 DIAGNOSIS — H409 Unspecified glaucoma: Secondary | ICD-10-CM

## 2012-04-19 HISTORY — DX: Unspecified glaucoma: H40.9

## 2012-05-03 DIAGNOSIS — E119 Type 2 diabetes mellitus without complications: Secondary | ICD-10-CM | POA: Diagnosis not present

## 2012-05-03 DIAGNOSIS — H2510 Age-related nuclear cataract, unspecified eye: Secondary | ICD-10-CM | POA: Diagnosis not present

## 2012-05-08 ENCOUNTER — Other Ambulatory Visit: Payer: Self-pay | Admitting: *Deleted

## 2012-05-08 MED ORDER — EZETIMIBE-SIMVASTATIN 10-10 MG PO TABS: 1.0000 | ORAL_TABLET | Freq: Every day | ORAL | Status: DC

## 2012-05-08 NOTE — Telephone Encounter (Signed)
Refill done.  

## 2012-05-12 ENCOUNTER — Encounter: Payer: Self-pay | Admitting: *Deleted

## 2012-06-16 ENCOUNTER — Ambulatory Visit (INDEPENDENT_AMBULATORY_CARE_PROVIDER_SITE_OTHER): Payer: Medicare Other | Admitting: Internal Medicine

## 2012-06-16 ENCOUNTER — Encounter: Payer: Self-pay | Admitting: Internal Medicine

## 2012-06-16 VITALS — BP 148/86 | HR 61 | Temp 97.9°F | Wt 225.0 lb

## 2012-06-16 DIAGNOSIS — E559 Vitamin D deficiency, unspecified: Secondary | ICD-10-CM | POA: Diagnosis not present

## 2012-06-16 DIAGNOSIS — E119 Type 2 diabetes mellitus without complications: Secondary | ICD-10-CM | POA: Diagnosis not present

## 2012-06-16 DIAGNOSIS — R5381 Other malaise: Secondary | ICD-10-CM

## 2012-06-16 DIAGNOSIS — Z0000 Encounter for general adult medical examination without abnormal findings: Secondary | ICD-10-CM

## 2012-06-16 DIAGNOSIS — I10 Essential (primary) hypertension: Secondary | ICD-10-CM

## 2012-06-16 DIAGNOSIS — E785 Hyperlipidemia, unspecified: Secondary | ICD-10-CM

## 2012-06-16 DIAGNOSIS — R5383 Other fatigue: Secondary | ICD-10-CM | POA: Insufficient documentation

## 2012-06-16 LAB — LIPID PANEL
Cholesterol: 146 mg/dL (ref 0–200)
LDL Cholesterol: 82 mg/dL (ref 0–99)
VLDL: 14.4 mg/dL (ref 0.0–40.0)

## 2012-06-16 LAB — CBC WITH DIFFERENTIAL/PLATELET
Eosinophils Relative: 1.2 % (ref 0.0–5.0)
HCT: 36.9 % (ref 36.0–46.0)
Lymphs Abs: 1.4 10*3/uL (ref 0.7–4.0)
MCV: 82.2 fl (ref 78.0–100.0)
Monocytes Absolute: 0.2 10*3/uL (ref 0.1–1.0)
Neutro Abs: 2.5 10*3/uL (ref 1.4–7.7)
Platelets: 211 10*3/uL (ref 150.0–400.0)
RDW: 14.7 % — ABNORMAL HIGH (ref 11.5–14.6)
WBC: 4.2 10*3/uL — ABNORMAL LOW (ref 4.5–10.5)

## 2012-06-16 LAB — BASIC METABOLIC PANEL
BUN: 11 mg/dL (ref 6–23)
Chloride: 100 mEq/L (ref 96–112)
Glucose, Bld: 123 mg/dL — ABNORMAL HIGH (ref 70–99)
Potassium: 3.5 mEq/L (ref 3.5–5.1)

## 2012-06-16 LAB — ALT: ALT: 18 U/L (ref 0–35)

## 2012-06-16 MED ORDER — ZOSTER VACCINE LIVE 19400 UNT/0.65ML ~~LOC~~ SOLR: 0.6500 mL | Freq: Once | SUBCUTANEOUS | Status: DC

## 2012-06-16 NOTE — Assessment & Plan Note (Signed)
Complains of fatigue for a while, she has a high BMI,  denies snoring  will check a CBC and recheck a vitamin D ;at some point consider sleep study

## 2012-06-16 NOTE — Progress Notes (Signed)
  Subjective:    Patient ID: Casey Bryan, female    DOB: 1945-06-26, 67 y.o.   MRN: [id number]  HPI Routine office visit Recently diagnosed with possible glaucoma, sees the eye doctor routinely. Status post a colonoscopy, next in 3 years. Apparently had polyps in the stomach, recommend to see GI. Concern about her family history of carotid artery disease, see assessment and plan. She also has a family history of heart disease, stop taking aspirin because make her bleed more frequently from her hemorrhoids. She is quite reluctant to restart ASA. High cholesterol good medication compliance Hypertension, good medication compliance, normal at her BPs Also complains of fatigue for long time, the daughter reports that she is not known to snore. Denies anxiety, depression, dyspnea on exertion. She does have lower extremity edema mostly at the end of the day. Denies falling asleep easy except at night.    Past Medical History  Diagnosis Date  . Hyperlipemia   . Hypertension   . GERD (gastroesophageal reflux disease)   . Diabetes mellitus   . Glaucoma 04-2012   Past Surgical History  Procedure Laterality Date  . No past surgeries      F H  CAD-- F, B x 2, sister (CABG, stents), onset in their 37s  Carotid Dz-- several fam members  DM-- late onset F M  Colon ca-- cousin  Breast ca-- no  Multiple myeloma -- sister   Social History:  Married, 3 children  original [place]  tobacco--no  ETOH-- socially     Review of Systems See history of present illness. Denies chest pain, shortness or breath.  No nausea, vomiting, diarrhea. Occasional hemorrhoidal bleed, mild.    Objective:   Physical Exam General -- alert, well-developed, BMI 40.   Neck --no thyromegaly , normal carotid pulse, no carotid bruit. No JVD at 45 Lungs -- normal respiratory effort, no intercostal retractions, no accessory muscle use, and normal breath sounds.   Heart-- normal rate, regular rhythm, no murmur, and  no gallop.   Extremities-- no pitting pretibial edema bilaterally  Neurologic-- alert & oriented X3 and strength normal in all extremities. Psych-- Cognition and judgment appear intact. Alert and cooperative with normal attention span and concentration.  not anxious appearing and not depressed appearing.      Assessment & Plan:

## 2012-06-16 NOTE — Assessment & Plan Note (Signed)
Due for labs , discussed  diet-exercise

## 2012-06-16 NOTE — Assessment & Plan Note (Signed)
Has a family history of heart disease and carotid disease, reluctant to take aspirin see HPI. We reviewed the chart, she had a normal carotid ultrasound 10/01/2008. Consider  recheck a carotid ultrasound at some point

## 2012-06-16 NOTE — Assessment & Plan Note (Addendum)
No amb BPs, BP slightly elevated today. Plan: no change for now,  labs

## 2012-06-16 NOTE — Assessment & Plan Note (Signed)
Due for labs, good med compliance  

## 2012-06-19 ENCOUNTER — Encounter: Payer: Self-pay | Admitting: *Deleted

## 2012-06-21 DIAGNOSIS — H40019 Open angle with borderline findings, low risk, unspecified eye: Secondary | ICD-10-CM | POA: Diagnosis not present

## 2012-06-21 LAB — VITAMIN D 1,25 DIHYDROXY: Vitamin D 1, 25 (OH)2 Total: 51 pg/mL (ref 18–72)

## 2012-07-31 ENCOUNTER — Other Ambulatory Visit: Payer: Self-pay | Admitting: Internal Medicine

## 2012-08-01 NOTE — Telephone Encounter (Signed)
Refill done.  

## 2012-08-07 ENCOUNTER — Other Ambulatory Visit: Payer: Self-pay | Admitting: Internal Medicine

## 2012-08-08 NOTE — Telephone Encounter (Signed)
Refill done.  

## 2012-08-10 ENCOUNTER — Other Ambulatory Visit: Payer: Self-pay | Admitting: Internal Medicine

## 2012-08-10 NOTE — Telephone Encounter (Signed)
Refill done.  

## 2012-08-29 DIAGNOSIS — K648 Other hemorrhoids: Secondary | ICD-10-CM | POA: Diagnosis not present

## 2012-08-29 DIAGNOSIS — K219 Gastro-esophageal reflux disease without esophagitis: Secondary | ICD-10-CM | POA: Diagnosis not present

## 2012-08-29 DIAGNOSIS — D126 Benign neoplasm of colon, unspecified: Secondary | ICD-10-CM | POA: Diagnosis not present

## 2012-09-18 ENCOUNTER — Ambulatory Visit (INDEPENDENT_AMBULATORY_CARE_PROVIDER_SITE_OTHER): Payer: Medicare Other | Admitting: General Surgery

## 2012-10-02 ENCOUNTER — Other Ambulatory Visit: Payer: Self-pay | Admitting: Internal Medicine

## 2012-10-03 NOTE — Telephone Encounter (Signed)
Refill done.  

## 2012-10-05 ENCOUNTER — Ambulatory Visit (INDEPENDENT_AMBULATORY_CARE_PROVIDER_SITE_OTHER): Payer: Medicare Other | Admitting: General Surgery

## 2012-10-05 DIAGNOSIS — K649 Unspecified hemorrhoids: Secondary | ICD-10-CM

## 2012-10-05 MED ORDER — LIDOCAINE 5 % EX OINT: TOPICAL_OINTMENT | CUTANEOUS | Status: DC | PRN

## 2012-10-05 MED ORDER — HYDROCORTISONE 2.5 % RE CREA: TOPICAL_CREAM | Freq: Two times a day (BID) | RECTAL | Status: DC

## 2012-10-05 NOTE — Progress Notes (Signed)
No chief complaint on file.   HISTORY: Casey Bryan is a 67 y.o. female who presents to the office with rectal bleeding.  Other symptoms include pain, burning.  This had been occurring for 3 years.  She has tried prep H, epson salt soaks, lidocaine ointment and change in toiletry habits in the past with some success.  Hard BM's makes the symptoms worse.   It is intermittent in nature.  Her bowel habits are regular and her bowel movements are sometimes hard.  She occasionally has to strain.  Her fiber intake is dietary.  Her last colonoscopy was recently and showed some colon polyps but no obvious source of bleeding.  She is due again in 3 years.  She has been told to take fiber in the past but refuses because she doesn't want to have more BM's than she does now.  She states that it is a very unpleasant experience at the moment.      Past Medical History  Diagnosis Date  . Hyperlipemia   . Hypertension   . GERD (gastroesophageal reflux disease)   . Diabetes mellitus   . Glaucoma 04-2012      Past Surgical History  Procedure Laterality Date  . No past surgeries          Current Outpatient Prescriptions  Medication Sig Dispense Refill  . ACIPHEX 20 MG tablet TAKE 1 TABLET BY MOUTH DAILY  30 tablet  6  . Advocate Lancets MISC Check BS 1x/day dx 250.00  100 each  11  . Blood Glucose Monitoring Suppl (ADVOCATE REDI-CODE) DEVI by Does not apply route. Dx 250.00       . Calcium Carbonate-Vit D-Min (CALCIUM 1200 PO) Take by mouth.        Marland Kitchen glucose blood (ADVOCATE REDI-CODE) test strip 1 each by Other route as needed. Checks 1x daily dx 250.00       . hydrochlorothiazide (HYDRODIURIL) 25 MG tablet TAKE 1 TABLET BY MOUTH DAILY  90 tablet  1  . hydrocortisone (ANUSOL-HC) 2.5 % rectal cream Place rectally 2 (two) times daily. Apply around anus for irritated & painful hemorrhoids  15 g  2  . lidocaine (XYLOCAINE) 5 % ointment Apply 1 application topically daily.      Marland Kitchen lidocaine (XYLOCAINE) 5 %  ointment Apply topically as needed.  35.44 g  0  . metoprolol (LOPRESSOR) 50 MG tablet TAKE 1 TABLET BY MOUTH TWICE DAILY  180 tablet  1  . Multiple Vitamin (MULTIVITAMIN) tablet Take 1 tablet by mouth daily.        . Multiple Vitamins-Minerals (EYE VITAMINS) CAPS Take 1 capsule by mouth daily.      . ranitidine (ZANTAC) 150 MG capsule Take 150 mg by mouth daily as needed.        Marland Kitchen VYTORIN 10-10 MG per tablet TAKE 1 TABLET BY MOUTH EVERY NIGHT AT BEDTIME  90 tablet  1  . zoster vaccine live, PF, (ZOSTAVAX) 91478 UNT/0.65ML injection Inject 19,400 Units into the skin once.  1 each  0   No current facility-administered medications for this visit.      Allergies  Allergen Reactions  . Clarithromycin (Clarithromycin)     Contradictions w/Vytorin  . Penicillins   . Tetanus-Diphtheria Toxoids Td     REACTION: see OV note  02-11-10      Family History  Problem Relation Age of Onset  . Diabetes      uncles, mother , father   . Colon cancer Neg  Hx   . Breast cancer Neg Hx   . Coronary artery disease      F age 58, B age 88, uncle age 15 : MI    History   Social History  . Marital Status: Married    Spouse Name: N/A    Number of Children: 3  . Years of Education: N/A   Occupational History  . own busines, accountant    Social History Main Topics  . Smoking status: Never Smoker   . Smokeless tobacco: Not on file  . Alcohol Use: Yes     Comment: socially  . Drug Use: No  . Sexually Active: Not on file   Other Topics Concern  . Not on file   Social History Narrative   Original [place] .... Diet-- trying to eat healthy....  Exercise--takes walks x2 or 3 /week      REVIEW OF SYSTEMS - PERTINENT POSITIVES ONLY: Review of Systems - General ROS: negative for - chills, fever or weight loss Hematological and Lymphatic ROS: negative for - bleeding problems, blood clots or bruising Respiratory ROS: no cough, shortness of breath, or wheezing Cardiovascular ROS: no chest pain or  dyspnea on exertion Gastrointestinal ROS: positive for - blood in stools and constipation negative for - abdominal pain, change in bowel habits or nausea/vomiting Genito-Urinary ROS: no dysuria, trouble voiding, or hematuria  EXAM: There were no vitals filed for this visit.  General appearance: alert and cooperative Resp: clear to auscultation bilaterally Cardio: regular rate and rhythm GI: soft, non-tender; bowel sounds normal; no masses,  no organomegaly  Anal Exam Findings: large, edematous external hemorrhoids with prolapsing internal hemorrhoids, unable to complete exam due to pain    ASSESSMENT AND PLAN: Casey Bryan is a 67 y.o. F with rectal bleeding and pain.  On exam she has inflamed internal and external hemorrhoids.  I have asked her to start a fiber supplement, stool softener and sitz baths daily.  I have refilled her lidocaine ointment prescription and given her a prescription for anusol.  We discussed hemorrhoid management in detail.  I will see her back in 4 weeks.  Hopefully her inflammation will have subsided enough by then for me to do a complete exam.      Casey Panda, MD Colon and Rectal Surgery / General Surgery [facility]      Visit Diagnoses: 1. Hemorrhoids     Primary Care Physician: Casey Ora, MD

## 2012-10-05 NOTE — Patient Instructions (Signed)
Start a fiber supplement daily.  Take stool softener like colace twice a day.  Sit in a warm bath 3-4 times a day and especially after bowel movements.  If your bowel movements are still hard, take miralax in addition to your other treatments.   GETTING TO GOOD BOWEL HEALTH. Irregular bowel habits such as constipation can lead to many problems over time.  Having one soft bowel movement a day is the most important way to prevent further problems.  The anorectal canal is designed to handle stretching and feces to safely manage our ability to get rid of solid waste (feces, poop, stool) out of our body.  BUT, hard constipated stools can act like ripping concrete bricks causing inflamed hemorrhoids, anal fissures, abdominal pain and bloating.     The goal: ONE SOFT BOWEL MOVEMENT A DAY!  To have soft, regular bowel movements:    Drink at least 8 tall glasses of water a day.     Take plenty of fiber.  Fiber is the undigested part of plant food that passes into the colon, acting s "natures broom" to encourage bowel motility and movement.  Fiber can absorb and hold large amounts of water. This results in a larger, bulkier stool, which is soft and easier to pass. Work gradually over several weeks up to 6 servings a day of fiber (25g a day even more if needed) in the form of: o Vegetables -- Root (potatoes, carrots, turnips), leafy green (lettuce, salad greens, celery, spinach), or cooked high residue (cabbage, broccoli, etc) o Fruit -- Fresh (unpeeled skin & pulp), Dried (prunes, apricots, cherries, etc ),  or stewed ( applesauce)  o Whole grain breads, pasta, etc (whole wheat)  o Bran cereals    Bulking Agents -- This type of water-retaining fiber generally is easily obtained each day by one of the following:  o Psyllium bran -- The psyllium plant is remarkable because its ground seeds can retain so much water. This product is available as Metamucil, Konsyl, Effersyllium, Per Diem Fiber, or the less expensive  generic preparation in drug and health food stores. Although labeled a laxative, it really is not a laxative.  o Methylcellulose -- This is another fiber derived from wood which also retains water. It is available as Citrucel. o Polyethylene Glycol - and "artificial" fiber commonly called Miralax or Glycolax.  It is helpful for people with gassy or bloated feelings with regular fiber o Flax Seed - a less gassy fiber than psyllium   No reading or other relaxing activity while on the toilet. If bowel movements take longer than 5 minutes, you are too constipated   AVOID CONSTIPATION.  High fiber and water intake usually takes care of this.  Sometimes a laxative is needed to stimulate more frequent bowel movements, but    Laxatives are not a good long-term solution as it can wear the colon out. o Osmotics (Milk of Magnesia, Fleets phosphosoda, Magnesium citrate, MiraLax, GoLytely) are safer than  o Stimulants (Senokot, Castor Oil, Dulcolax, Ex Lax)    o Do not take laxatives for more than 7days in a row.    IF SEVERELY CONSTIPATED, try a Bowel Retraining Program: o Do not use laxatives.  o Eat a diet high in roughage, such as bran cereals and leafy vegetables.  o Drink six (6) ounces of prune or apricot juice each morning.  o Eat two (2) large servings of stewed fruit each day.  o Take one (1) heaping tablespoon of a psyllium-based  bulking agent twice a day. Use sugar-free sweetener when possible to avoid excessive calories.  o Eat a normal breakfast.  o Set aside 15 minutes after breakfast to sit on the toilet, but do not strain to have a bowel movement.  o If you do not have a bowel movement by the third day, use an enema and repeat the above steps.   Fiber Chart  You should 25-30g of fiber per day and drinking 8 glasses of water to help your bowels move regularly.  In the chart below you can look up how much fiber you are getting in an average day.  If you are not getting enough fiber, you  should add a fiber supplement to your diet.  Examples of this include Metamucil, FiberCon and Citrucel.  These can be purchased at your local grocery store or pharmacy.      [email or web address]      HEMORRHOIDS    Did you know... Hemorrhoids are one of the most common ailments known.  More than half the population will develop hemorrhoids, usually after age 62.  Millions of Americans currently suffer from hemorrhoids.  The average person suffers in silence for a long period before seeking medical care.  Today's treatment methods make some types of hemorrhoid removal much less painful.  What are hemorrhoids? Often described as "varicose veins of the anus and rectum", hemorrhoids are enlarged, bulging blood vessels in and about the anus and lower rectum. There are two types of hemorrhoids: external and internal, which refer to their location.  External (outside) hemorrhoids develop near the anus and are covered by very sensitive skin. These are usually painless. However, if a blood clot (thrombosis) develops in an external hemorrhoid, it becomes a painful, hard lump. The external hemorrhoid may bleed if it ruptures. Internal (inside) hemorrhoids develop within the anus beneath the lining. Painless bleeding and protrusion during bowel movements are the most common symptom. However, an internal hemorrhoid can cause severe pain if it is completely "prolapsed" - protrudes from the anal opening and cannot be pushed back inside.   What causes hemorrhoids? An exact cause is unknown; however, the upright posture of humans alone forces a great deal of pressure on the rectal veins, which sometimes causes them to bulge. Other contributing factors include:  . Aging  . Chronic constipation or diarrhea  . Pregnancy  . Heredity  . Straining during bowel movements  . Faulty bowel function due to overuse of laxatives or enemas . Spending long  periods of time (e.g., reading) on the toilet  Whatever the cause, the tissues supporting the vessels stretch. As a result, the vessels dilate; their walls become thin and bleed. If the stretching and pressure continue, the weakened vessels protrude.  What are the symptoms? If you notice any of the following, you could have hemorrhoids:  . Bleeding during bowel movements  . Protrusion during bowel movements . Itching in the anal area  . Pain  . Sensitive lump(s)  How are hemorrhoids treated? Mild symptoms can be relieved frequently by increasing the amount of fiber (e.g., fruits, vegetables, breads and cereals) and fluids in the diet. Eliminating excessive straining reduces the pressure on hemorrhoids and helps prevent them from protruding. A sitz bath - sitting in plain warm water for about 10 minutes - can also provide some relief . With these measures, the pain and swelling of most symptomatic hemorrhoids will decrease in two to seven days, and the firm lump should recede within four to six  weeks. In cases of severe or persistent pain from a thrombosed hemorrhoid, your physician may elect to remove the hemorrhoid containing the clot with a small incision. Performed under local anesthesia as an outpatient, this procedure generally provides relief. Severe hemorrhoids may require special treatment, much of which can be performed on an outpatient basis.  . Ligation - the rubber band treatment - works effectively on internal hemorrhoids that protrude with bowel movements. A small rubber band is placed over the hemorrhoid, cutting off its blood supply. The hemorrhoid and the band fall off in a few days and the wound usually heals in a week or two. This procedure sometimes produces mild discomfort and bleeding and may need to be repeated for a full effect.  There is a more intense version of this procedure that is done in the OR as outpatient surgery called THD.  It involves identifying blood vessels  leading to the hemorrhoids and then tying them off with sutures.  This method is a little more painful than rubber band ligation but less painful than traditional hemorrhoidectomy and usually does not have to be repeated.  It is best for internal hemorrhoids that bleed.  Rubber Band Ligation of Internal Hemorrhoids:  A.  Bulging, bleeding, internal hemorrhoid B.  Rubber band applied at the base of the hemorrhoid C.  About 7 days later, the banded hemorrhoid has fallen off leaving a small scar (arrow)  . Injection and Coagulation can also be used on bleeding hemorrhoids that do not protrude. Both methods are relatively painless and cause the hemorrhoid to shrivel up. Marland Kitchen Hemorrhoidectomy - surgery to remove the hemorrhoids - is the most complete method for removal of internal and external hemorrhoids. It is necessary when (1) clots repeatedly form in external hemorrhoids; (2) ligation fails to treat internal hemorrhoids; (3) the protruding hemorrhoid cannot be reduced; or (4) there is persistent bleeding. A hemorrhoidectomy removes excessive tissue that causes the bleeding and protrusion. It is done under anesthesia using sutures, and may, depending upon circumstances, require hospitalization and a period of inactivity. Laser hemorrhoidectomies do not offer any advantage over standard operative techniques. They are also quite expensive, and contrary to popular belief, are no less painful.  Do hemorrhoids lead to cancer? No. There is no relationship between hemorrhoids and cancer. However, the symptoms of hemorrhoids, particularly bleeding, are similar to those of colorectal cancer and other diseases of the digestive system. Therefore, it is important that all symptoms are investigated by a physician specially trained in treating diseases of the colon and rectum and that everyone 50 years or older undergo screening tests for colorectal cancer. Do not rely on over-the-counter medications or other  self-treatments. See a colorectal surgeon first so your symptoms can be properly evaluated and effective treatment prescribed.  2012 American Society of Colon & Rectal Surgeons

## 2012-10-09 DIAGNOSIS — H40019 Open angle with borderline findings, low risk, unspecified eye: Secondary | ICD-10-CM | POA: Diagnosis not present

## 2012-10-09 DIAGNOSIS — H353190 Nonexudative age-related macular degeneration, unspecified eye, stage unspecified: Secondary | ICD-10-CM | POA: Diagnosis not present

## 2012-11-01 ENCOUNTER — Ambulatory Visit (INDEPENDENT_AMBULATORY_CARE_PROVIDER_SITE_OTHER): Payer: Medicare Other | Admitting: General Surgery

## 2012-11-20 ENCOUNTER — Ambulatory Visit (INDEPENDENT_AMBULATORY_CARE_PROVIDER_SITE_OTHER): Payer: Medicare Other | Admitting: General Surgery

## 2012-11-29 ENCOUNTER — Encounter (INDEPENDENT_AMBULATORY_CARE_PROVIDER_SITE_OTHER): Payer: Self-pay | Admitting: General Surgery

## 2012-12-19 ENCOUNTER — Encounter: Payer: Self-pay | Admitting: Internal Medicine

## 2012-12-19 ENCOUNTER — Ambulatory Visit (INDEPENDENT_AMBULATORY_CARE_PROVIDER_SITE_OTHER): Payer: Medicare Other | Admitting: Internal Medicine

## 2012-12-19 VITALS — BP 150/90 | HR 66 | Temp 98.2°F | Ht 62.8 in | Wt 228.2 lb

## 2012-12-19 DIAGNOSIS — K649 Unspecified hemorrhoids: Secondary | ICD-10-CM

## 2012-12-19 DIAGNOSIS — E785 Hyperlipidemia, unspecified: Secondary | ICD-10-CM

## 2012-12-19 DIAGNOSIS — E119 Type 2 diabetes mellitus without complications: Secondary | ICD-10-CM | POA: Diagnosis not present

## 2012-12-19 DIAGNOSIS — Z0000 Encounter for general adult medical examination without abnormal findings: Secondary | ICD-10-CM | POA: Diagnosis not present

## 2012-12-19 DIAGNOSIS — I10 Essential (primary) hypertension: Secondary | ICD-10-CM | POA: Diagnosis not present

## 2012-12-19 DIAGNOSIS — K219 Gastro-esophageal reflux disease without esophagitis: Secondary | ICD-10-CM

## 2012-12-19 DIAGNOSIS — E559 Vitamin D deficiency, unspecified: Secondary | ICD-10-CM | POA: Diagnosis not present

## 2012-12-19 LAB — AST: AST: 17 U/L (ref 0–37)

## 2012-12-19 MED ORDER — LOSARTAN POTASSIUM-HCTZ 50-12.5 MG PO TABS: 1.0000 | ORAL_TABLET | Freq: Every day | ORAL | Status: DC

## 2012-12-19 NOTE — Assessment & Plan Note (Signed)
Seems well controlled, check ast-alt

## 2012-12-19 NOTE — Assessment & Plan Note (Signed)
Recently saw surgery, doing better

## 2012-12-19 NOTE — Progress Notes (Signed)
  Subjective:    Patient ID: Casey Bryan, female    DOB: 03/11/1946, 67 y.o.   MRN: [id number]  HPI Here for Medicare AWV, here with her daughter :  1. Risk factors based on Past M, S, F history: reviewed  2. Physical Activities: Active at home, occ walks , more active compared to previous years  3. Depression/mood: Neg screening 4. Hearing: No problemss noted or reported  5. ADL's: independent  6. Fall Risk: no recent falls, prevention discussed  7. home Safety: does feel safe at home  8. Height, weight, &visual acuity: see VS, has early cataracts , sees the eye doctor regualrly 9. Counseling: provided  10. Labs ordered based on risk factors: if needed  11. Referral Coordination: if needed  12. Care Plan, see assessment and plan  13. Cognitive Assessment: Motor skills and cognition within normal   In addition, today we discussed the following: GERD, symptoms under excellent control, see assessment and plan. Diabetes, checks his CBGs regularly, usually 110. Hypertension, ambulatory BPs usually 135, 145/85. High cholesterol, good medication compliance.  Past Medical History  Diagnosis Date  . Hyperlipemia   . Hypertension   . GERD (gastroesophageal reflux disease)   . Diabetes mellitus   . Glaucoma 04-2012   Past Surgical History  Procedure Laterality Date  . No past surgeries       F H  CAD-- F, B x 2, sister (CABG, stents), onset in their 54s  Carotid Dz-- several fam members  DM-- late onset F M  Colon ca-- cousin  Breast ca-- no  Multiple myeloma -- sister   Social History:  Married, 3 children  original [place]  tobacco--no  ETOH--   Review of Systems No chest pain or shortness or breath No nausea, vomiting, diarrhea. Occasionally sees blood in the stools, dx with hemorrhoids, symptoms are better with topical treatment. Occasionally feels tired at the end of the day, minimal snoring, not  feeling sleepy. She is very busy taking care of the house,  grandchildren.     Objective:   Physical Exam BP 150/90  Pulse 66  Temp(Src) 98.2 F (36.8 C)  Ht 5' 2.8" (1.595 m)  Wt 228 lb 3.2 oz (103.511 kg)  BMI 40.69 kg/m2  SpO2 94%  General -- alert, well-developed, NAD.  Neck --no thyromegaly Lungs -- normal respiratory effort, no intercostal retractions, no accessory muscle use, and normal breath sounds.  Heart-- normal rate, regular rhythm, no murmur.  Abdomen-- Not distended, good bowel sounds,soft, non-tender.  Extremities-- trace edema at the dorsum of L>R foot Neurologic-- alert & oriented X3. Speech, gait normal.  Psych-- Cognition and judgment appear intact. Alert and cooperative with normal attention span and concentration. not anxious appearing and not depressed appearing.       Assessment & Plan:

## 2012-12-19 NOTE — Assessment & Plan Note (Signed)
Will check a hemoglobin A1c, diet and exercise encouraged Sees eye MD regularly.. Discussed feet care

## 2012-12-19 NOTE — Patient Instructions (Addendum)
Get your blood work before you leave  Start the new Blood pressure medicine (losartan HCT) and stop hydrochlorothiazide  Come back in 3 weeks for labs only (BMP dx HTN) Next visit in 3 months   Fall Prevention and Home Safety Falls cause injuries and can affect all age groups. It is possible to use preventive measures to significantly decrease the likelihood of falls. There are many simple measures which can make your home safer and prevent falls. OUTDOORS  Repair cracks and edges of walkways and driveways.  Remove high doorway thresholds.  Trim shrubbery on the main path into your home.  Have good outside lighting.  Clear walkways of tools, rocks, debris, and clutter.  Check that handrails are not broken and are securely fastened. Both sides of steps should have handrails.  Have leaves, snow, and ice cleared regularly.  Use sand or salt on walkways during winter months.  In the garage, clean up grease or oil spills. BATHROOM  Install night lights.  Install grab bars by the toilet and in the tub and shower.  Use non-skid mats or decals in the tub or shower.  Place a plastic non-slip stool in the shower to sit on, if needed.  Keep floors dry and clean up all water on the floor immediately.  Remove soap buildup in the tub or shower on a regular basis.  Secure bath mats with non-slip, double-sided rug tape.  Remove throw rugs and tripping hazards from the floors. BEDROOMS  Install night lights.  Make sure a bedside light is easy to reach.  Do not use oversized bedding.  Keep a telephone by your bedside.  Have a firm chair with side arms to use for getting dressed.  Remove throw rugs and tripping hazards from the floor. KITCHEN  Keep handles on pots and pans turned toward the center of the stove. Use back burners when possible.  Clean up spills quickly and allow time for drying.  Avoid walking on wet floors.  Avoid hot utensils and knives.  Position  shelves so they are not too high or low.  Place commonly used objects within easy reach.  If necessary, use a sturdy step stool with a grab bar when reaching.  Keep electrical cables out of the way.  Do not use floor polish or wax that makes floors slippery. If you must use wax, use non-skid floor wax.  Remove throw rugs and tripping hazards from the floor. STAIRWAYS  Never leave objects on stairs.  Place handrails on both sides of stairways and use them. Fix any loose handrails. Make sure handrails on both sides of the stairways are as long as the stairs.  Check carpeting to make sure it is firmly attached along stairs. Make repairs to worn or loose carpet promptly.  Avoid placing throw rugs at the top or bottom of stairways, or properly secure the rug with carpet tape to prevent slippage. Get rid of throw rugs, if possible.  Have an electrician put in a light switch at the top and bottom of the stairs. OTHER FALL PREVENTION TIPS  Wear low-heel or rubber-soled shoes that are supportive and fit well. Wear closed toe shoes.  When using a stepladder, make sure it is fully opened and both spreaders are firmly locked. Do not climb a closed stepladder.  Add color or contrast paint or tape to grab bars and handrails in your home. Place contrasting color strips on first and last steps.  Learn and use mobility aids as needed. Install  an electrical emergency response system.  Turn on lights to avoid dark areas. Replace light bulbs that burn out immediately. Get light switches that glow.  Arrange furniture to create clear pathways. Keep furniture in the same place.  Firmly attach carpet with non-skid or double-sided tape.  Eliminate uneven floor surfaces.  Select a carpet pattern that does not visually hide the edge of steps.  Be aware of all pets. OTHER HOME SAFETY TIPS  Set the water temperature for 120 F (48.8 C).  Keep emergency numbers on or near the telephone.  Keep  smoke detectors on every level of the home and near sleeping areas. Document Released: 03/26/2002 Document Revised: 10/05/2011 Document Reviewed: 06/25/2011 [facility] Patient Information 2014 [place], [place].    Diabetes and Foot Care Diabetes may cause you to have a poor blood supply (circulation) to your legs and feet. Because of this, the skin may be thinner, break easier, and heal more slowly. You also may have nerve damage in your legs and feet causing decreased feeling. You may not notice minor injuries to your feet that could lead to serious problems or infections. Taking care of your feet is one of the most important things you can do for yourself.  HOME CARE INSTRUCTIONS  Do not go barefoot. Bare feet are easily injured.  Check your feet daily for blisters, cuts, and redness.  Wash your feet with warm water (not hot) and mild soap. Pat your feet and between your toes until completely dry.  Apply a moisturizing lotion that does not contain alcohol or petroleum jelly to the dry skin on your feet and to dry brittle toenails. Do not put it between your toes.  Trim your toenails straight across. Do not dig under them or around the cuticle.  Do not cut corns or calluses, or try to remove them with medicine.  Wear clean cotton socks or stockings every day. Make sure they are not too tight. Do not wear knee high stockings since they may decrease blood flow to your legs.  Wear leather shoes that fit properly and have enough cushioning. To break in new shoes, wear them just a few hours a day to avoid injuring your feet.  Wear shoes at all times, even in the house.  Do not cross your legs. This may decrease the blood flow to your feet.  If you find a minor scrape, cut, or break in the skin on your feet, keep it and the skin around it clean and dry. These areas may be cleansed with mild soap and water. Do not use peroxide, alcohol, iodine or Merthiolate.  When you remove an adhesive  bandage, be sure not to harm the skin around it.  If you have a wound, look at it several times a day to make sure it is healing.  Do not use heating pads or hot water bottles. Burns can occur. If you have lost feeling in your feet or legs, you may not know it is happening until it is too late.  Report any cuts, sores or bruises to your caregiver. Do not wait! SEEK MEDICAL CARE IF:   You have an injury that is not healing or you notice redness, numbness, burning, or tingling.  Your feet always feel cold.  You have pain or cramps in your legs and feet. SEEK IMMEDIATE MEDICAL CARE IF:   There is increasing redness, swelling, or increasing pain in the wound.  There is a red line that goes up your leg.  Pus  is coming from a wound.  You develop an unexplained oral temperature above 102 F (38.9 C), or as your caregiver suggests.  You notice a bad smell coming from an ulcer or wound. MAKE SURE YOU:   Understand these instructions.  Will watch your condition.  Will get help right away if you are not doing well or get worse. Document Released: 04/02/2000 Document Revised: 06/28/2011 Document Reviewed: 10/09/2008 [facility] Patient Information 2014 [place], [place].

## 2012-12-19 NOTE — Assessment & Plan Note (Addendum)
Most ambulatory BPs 145/85, could be better. Discontinue HCTZ Start losartan HCT, see instructions

## 2012-12-19 NOTE — Assessment & Plan Note (Signed)
Labs

## 2012-12-19 NOTE — Assessment & Plan Note (Signed)
Well controlled with PPIs, hardly ever uses Zantac

## 2012-12-19 NOTE — Assessment & Plan Note (Addendum)
Td 2011 Pneumonia 2008 Zostavax benefits discussed, prescription provided, so far decided not to proceed  Has a family history of heart disease and carotid disease, reluctant to take aspirin .Had  a normal carotid ultrasound 10/01/2008. Consider  recheck a carotid ultrasound at some point  cscope Dr Randa Evens ~ 2008, normal, cscope again 12-2011, + polyps, was told repeat in 3 years  MMG 11-2011 neg  PAP 07-2010 normal; no history of abnormal Paps, is very low risk. Next PAP 2015   Bone density test was done at gynecology ~ 2012, reportedly normal, no records.Does take  daily calcium and vitamin D.

## 2012-12-21 ENCOUNTER — Encounter: Payer: Self-pay | Admitting: *Deleted

## 2013-01-10 ENCOUNTER — Other Ambulatory Visit (INDEPENDENT_AMBULATORY_CARE_PROVIDER_SITE_OTHER): Payer: Medicare Other

## 2013-01-10 DIAGNOSIS — I10 Essential (primary) hypertension: Secondary | ICD-10-CM

## 2013-01-10 LAB — BASIC METABOLIC PANEL
BUN: 10 mg/dL (ref 6–23)
CO2: 31 mEq/L (ref 19–32)
Chloride: 102 mEq/L (ref 96–112)
Creatinine, Ser: 0.6 mg/dL (ref 0.4–1.2)
Glucose, Bld: 119 mg/dL — ABNORMAL HIGH (ref 70–99)

## 2013-01-12 ENCOUNTER — Telehealth: Payer: Self-pay | Admitting: Internal Medicine

## 2013-01-12 MED ORDER — LOSARTAN POTASSIUM-HCTZ 50-12.5 MG PO TABS: 2.0000 | ORAL_TABLET | Freq: Every day | ORAL | Status: DC

## 2013-01-12 NOTE — Telephone Encounter (Signed)
BMP okay, I spoke with the patient's daughter, her blood pressure is still ~ 150, 160s. She's not following a low salt diet. We talked about amlodipine however the pt already has some edema Plan: Increase losartan HCT 50-25 from 1 qd to 2 qd, new prescription sent. Follow up in 6 weeks

## 2013-02-12 ENCOUNTER — Other Ambulatory Visit: Payer: Self-pay | Admitting: *Deleted

## 2013-02-13 ENCOUNTER — Ambulatory Visit (INDEPENDENT_AMBULATORY_CARE_PROVIDER_SITE_OTHER): Payer: Medicare Other | Admitting: General Practice

## 2013-02-13 DIAGNOSIS — Z23 Encounter for immunization: Secondary | ICD-10-CM | POA: Diagnosis not present

## 2013-02-21 ENCOUNTER — Other Ambulatory Visit: Payer: Self-pay | Admitting: *Deleted

## 2013-02-21 MED ORDER — ADVOCATE LANCETS MISC: Status: DC

## 2013-02-27 ENCOUNTER — Other Ambulatory Visit: Payer: Self-pay | Admitting: *Deleted

## 2013-02-27 MED ORDER — BD LANCET DEVICE MISC: Status: DC

## 2013-02-27 NOTE — Telephone Encounter (Signed)
Lancets refilled 

## 2013-03-21 ENCOUNTER — Encounter: Payer: Self-pay | Admitting: Internal Medicine

## 2013-03-21 ENCOUNTER — Ambulatory Visit (INDEPENDENT_AMBULATORY_CARE_PROVIDER_SITE_OTHER): Payer: Medicare Other | Admitting: Internal Medicine

## 2013-03-21 VITALS — BP 138/79 | HR 71 | Temp 98.3°F | Wt 224.0 lb

## 2013-03-21 DIAGNOSIS — I10 Essential (primary) hypertension: Secondary | ICD-10-CM

## 2013-03-21 DIAGNOSIS — K219 Gastro-esophageal reflux disease without esophagitis: Secondary | ICD-10-CM

## 2013-03-21 DIAGNOSIS — R5381 Other malaise: Secondary | ICD-10-CM

## 2013-03-21 DIAGNOSIS — Z23 Encounter for immunization: Secondary | ICD-10-CM | POA: Diagnosis not present

## 2013-03-21 DIAGNOSIS — E785 Hyperlipidemia, unspecified: Secondary | ICD-10-CM

## 2013-03-21 LAB — VITAMIN B12: Vitamin B-12: 313 pg/mL (ref 211–911)

## 2013-03-21 LAB — BASIC METABOLIC PANEL
BUN: 14 mg/dL (ref 6–23)
Calcium: 9.5 mg/dL (ref 8.4–10.5)
Creatinine, Ser: 0.6 mg/dL (ref 0.4–1.2)
GFR: 98.52 mL/min (ref >60.00)
Glucose, Bld: 121 mg/dL — ABNORMAL HIGH (ref 70–99)

## 2013-03-21 MED ORDER — RABEPRAZOLE SODIUM 20 MG PO TBEC: DELAYED_RELEASE_TABLET | ORAL | Status: DC

## 2013-03-21 MED ORDER — METOPROLOL TARTRATE 50 MG PO TABS: ORAL_TABLET | ORAL | Status: DC

## 2013-03-21 MED ORDER — EZETIMIBE-SIMVASTATIN 10-10 MG PO TABS: ORAL_TABLET | ORAL | Status: DC

## 2013-03-21 MED ORDER — LOSARTAN POTASSIUM-HCTZ 50-12.5 MG PO TABS: 2.0000 | ORAL_TABLET | Freq: Every day | ORAL | Status: DC

## 2013-03-21 NOTE — Progress Notes (Signed)
   Subjective:    Patient ID: Casey Bryan, female    DOB: March 31, 1946, 67 y.o.   MRN: [id number]  HPI Hypertension followup Good compliance would losartan, no apparent side effects. She mistakenly discontinue metoprolol. Ambulatory BPs in the 150s, 140s/90s and 70s. Occasionally systolic BP in the 160. Needs a refill on her cholesterol and GERD medication.   Past Medical History  Diagnosis Date  . Hyperlipemia   . Hypertension   . GERD (gastroesophageal reflux disease)   . Diabetes mellitus   . Glaucoma 04-2012   Past Surgical History  Procedure Laterality Date  . No past surgeries     History   Social History  . Marital Status: Married    Spouse Name: N/A    Number of Children: 3  . Years of Education: N/A   Occupational History  . own busines, accountant    Social History Main Topics  . Smoking status: Never Smoker   . Smokeless tobacco: Not on file  . Alcohol Use: Yes     Comment: socially  . Drug Use: No  . Sexual Activity: Not on file   Other Topics Concern  . Not on file   Social History Narrative   Original [place]     FH  CAD-- F, B x 2, sister (CABG, stents), onset in their 9s   Carotid Dz-- several fam members   DM-- late onset F M   Colon ca-- cousin   Breast ca-- no   Multiple myeloma -- sister     Review of Systems Denies chest pain or difficulty breathing No nausea, vomiting, diarrhea. Also complained of fatigue, this is a chronic issue " all my life "     Objective:   Physical Exam BP 138/79  Pulse 71  Temp(Src) 98.3 F (36.8 C)  Wt 224 lb (101.606 kg)  SpO2 97% General -- alert, well-developed, NAD.  Lungs -- normal respiratory effort, no intercostal retractions, no accessory muscle use, and normal breath sounds.  Heart-- normal rate, regular rhythm, no murmur.   Extremities-- trace  pretibial edema bilaterally  Neurologic--  alert & oriented X3.   Psych-- Cognition and judgment appear intact. Cooperative with normal  attention span and concentration. No anxious appearing , no depressed appearing.     Assessment & Plan:

## 2013-03-21 NOTE — Assessment & Plan Note (Signed)
RF meds  

## 2013-03-21 NOTE — Patient Instructions (Signed)
Get your blood work before you leave  Next visit for a follow up (30 minutes)  regards diabetes hypertension , fasting, in 3-4 months  Please make an appointment    Restart metoprolol 50 mg twice a day, if that drives your blood pressure to low (less than 120/70) decrease metoprolol to 25 mg twice a day and let me know.

## 2013-03-21 NOTE — Progress Notes (Signed)
Pre visit review using our clinic review tool, if applicable. No additional management support is needed unless otherwise documented below in the visit note. 

## 2013-03-21 NOTE — Assessment & Plan Note (Addendum)
On   losartan HCT 50-12.5 mg 2 tablets daily, she mistakenly stopped metoprolol. BP needs a slightly better control, see HPI, goal 120/80 Plan:  RF Losartan HCT  Restart metoprolol Self BP check Return to the office in 3 months Also complained of chronic fatigue, vitamin D was normal, will check a B12

## 2013-03-27 ENCOUNTER — Encounter: Payer: Self-pay | Admitting: *Deleted

## 2013-04-09 DIAGNOSIS — H40019 Open angle with borderline findings, low risk, unspecified eye: Secondary | ICD-10-CM | POA: Diagnosis not present

## 2013-04-09 DIAGNOSIS — H353190 Nonexudative age-related macular degeneration, unspecified eye, stage unspecified: Secondary | ICD-10-CM | POA: Diagnosis not present

## 2013-05-07 ENCOUNTER — Other Ambulatory Visit: Payer: Self-pay | Admitting: Internal Medicine

## 2013-05-23 ENCOUNTER — Encounter: Payer: Self-pay | Admitting: Internal Medicine

## 2013-05-23 ENCOUNTER — Ambulatory Visit (INDEPENDENT_AMBULATORY_CARE_PROVIDER_SITE_OTHER): Payer: Medicare Other | Admitting: Internal Medicine

## 2013-05-23 VITALS — BP 118/69 | HR 70 | Temp 98.9°F | Wt 226.0 lb

## 2013-05-23 DIAGNOSIS — B9789 Other viral agents as the cause of diseases classified elsewhere: Secondary | ICD-10-CM

## 2013-05-23 DIAGNOSIS — B349 Viral infection, unspecified: Secondary | ICD-10-CM

## 2013-05-23 DIAGNOSIS — I10 Essential (primary) hypertension: Secondary | ICD-10-CM

## 2013-05-23 DIAGNOSIS — K219 Gastro-esophageal reflux disease without esophagitis: Secondary | ICD-10-CM

## 2013-05-23 MED ORDER — ESOMEPRAZOLE MAGNESIUM 40 MG PO CPDR: 40.0000 mg | DELAYED_RELEASE_CAPSULE | Freq: Every day | ORAL | Status: DC

## 2013-05-23 MED ORDER — OSELTAMIVIR PHOSPHATE 75 MG PO CAPS: 75.0000 mg | ORAL_CAPSULE | Freq: Two times a day (BID) | ORAL | Status: DC

## 2013-05-23 NOTE — Patient Instructions (Addendum)
Rest, fluids  Start tamiflu today  For  fever use Tylenol or motrin  For cough, take Mucinex DM twice a day as needed  For congestion use OTC Nasocort: 2 nasal sprays on each side of the nose daily until you feel better   Call if no better in few days Call anytime if the symptoms are severe, you have high fever, severe headache , a rash  Ask  your insurance for another ARB alternative to losartan HCT for your blood pressure

## 2013-05-23 NOTE — Assessment & Plan Note (Signed)
Symptoms well controlled with AcipHex but insurance won't cover it. Reports that Prilosec didn't work for her in the past Plan:   switch to Nexium

## 2013-05-23 NOTE — Progress Notes (Signed)
   Subjective:    Patient ID: Casey Bryan, female    DOB: 1945/07/12, 68 y.o.   MRN: [id number]  HPI Acute visit Sudden onset of symptoms yesterday, sore throat, more intense headaches, chills, fever up to 102.0. + Ear ache  She took Tylenol but had a hard time getting the temperature down. Hypertension--see assessment and plan, a letter from her insurance is reviewed, they won't cover losartanHCT GERD-- letter from her insurance is reviewed, AcipHex won't be covered by them  anymore, see assessment and plan.  Past Medical History  Diagnosis Date  . Hyperlipemia   . Hypertension   . GERD (gastroesophageal reflux disease)   . Diabetes mellitus   . Glaucoma 04-2012   Past Surgical History  Procedure Laterality Date  . No past surgeries       Review of Systems + Cough. No SOB. Mild generalized aches w/o a rash. No  nausea, vomiting, diarrhea     Objective:   Physical Exam  BP 118/69  Pulse 70  Temp(Src) 98.9 F (37.2 C)  Wt 226 lb (102.513 kg)  SpO2 96%  General -- alert, well-developed, no toxic appearing.  HEENT-- Not pale. TMs normal, throat symmetric, no redness or discharge. Face symmetric, sinuses not tender to palpation. Nose quite congested.  Lungs -- normal respiratory effort, no intercostal retractions, no accessory muscle use, and normal breath sounds.  Heart-- normal rate, regular rhythm, no murmur.  Extremities-- no pretibial edema bilaterally  Neurologic--  alert & oriented X3. Speech normal, gait normal, strength normal in all extremities.  Psych-- Cognition and judgment appear intact. Cooperative with normal attention span and concentration. No anxious or depressed appearing.     Assessment & Plan:  Viral syndrome, New problem Symptoms consistent with viral syndrome, no evidence of an acute bacterial infection on clinical grounds.  Although the flu test is negative I think is worth  to try Tamiflu since sx just started . See instructions.

## 2013-05-23 NOTE — Progress Notes (Signed)
Pre visit review using our clinic review tool, if applicable. No additional management support is needed unless otherwise documented below in the visit note. 

## 2013-05-23 NOTE — Assessment & Plan Note (Signed)
Losartan HCT not covered, asked pt's daughter to call insurance and find out what ARB is covered and let me know . BP well controlled

## 2013-05-25 ENCOUNTER — Telehealth: Payer: Self-pay | Admitting: Internal Medicine

## 2013-05-25 NOTE — Telephone Encounter (Signed)
Relevant patient education assigned to patient using Emmi. ° °

## 2013-06-11 ENCOUNTER — Telehealth: Payer: Self-pay | Admitting: Internal Medicine

## 2013-06-11 NOTE — Telephone Encounter (Signed)
Patient's daughter called stating we need to do a prior authorization for the patient's losartan. They will only approve 1 pill per day and patient takes 2. They need a quantity limit exception. Ph [phone] fax# [phone]

## 2013-06-19 NOTE — Telephone Encounter (Signed)
PA approved for Losartan. JG//CMA

## 2013-06-19 NOTE — Telephone Encounter (Signed)
Called and initiated prior authorization. Status is currently pending. JG//CMA

## 2013-07-25 ENCOUNTER — Ambulatory Visit (INDEPENDENT_AMBULATORY_CARE_PROVIDER_SITE_OTHER): Payer: Medicare Other | Admitting: Internal Medicine

## 2013-07-25 ENCOUNTER — Encounter: Payer: Self-pay | Admitting: Internal Medicine

## 2013-07-25 VITALS — BP 155/64 | HR 60 | Temp 98.2°F | Wt 222.0 lb

## 2013-07-25 DIAGNOSIS — R5383 Other fatigue: Secondary | ICD-10-CM

## 2013-07-25 DIAGNOSIS — I10 Essential (primary) hypertension: Secondary | ICD-10-CM

## 2013-07-25 DIAGNOSIS — E785 Hyperlipidemia, unspecified: Secondary | ICD-10-CM

## 2013-07-25 DIAGNOSIS — E119 Type 2 diabetes mellitus without complications: Secondary | ICD-10-CM | POA: Diagnosis not present

## 2013-07-25 DIAGNOSIS — K219 Gastro-esophageal reflux disease without esophagitis: Secondary | ICD-10-CM

## 2013-07-25 DIAGNOSIS — R5381 Other malaise: Secondary | ICD-10-CM | POA: Diagnosis not present

## 2013-07-25 LAB — LIPID PANEL
CHOL/HDL RATIO: 3
Cholesterol: 142 mg/dL (ref 0–200)
HDL: 49.1 mg/dL (ref >39.00)
LDL Cholesterol: 78 mg/dL (ref 0–99)
Triglycerides: 74 mg/dL (ref 0.0–149.0)
VLDL: 14.8 mg/dL (ref 0.0–40.0)

## 2013-07-25 LAB — BASIC METABOLIC PANEL
BUN: 13 mg/dL (ref 6–23)
CHLORIDE: 100 meq/L (ref 96–112)
CO2: 31 mEq/L (ref 19–32)
Calcium: 9.2 mg/dL (ref 8.4–10.5)
Creatinine, Ser: 0.6 mg/dL (ref 0.4–1.2)
GFR: 106.03 mL/min (ref >60.00)
Glucose, Bld: 112 mg/dL — ABNORMAL HIGH (ref 70–99)
POTASSIUM: 3.5 meq/L (ref 3.5–5.1)
Sodium: 139 mEq/L (ref 135–145)

## 2013-07-25 LAB — HEMOGLOBIN A1C: Hgb A1c MFr Bld: 6.9 % — ABNORMAL HIGH (ref 4.6–6.5)

## 2013-07-25 MED ORDER — LOSARTAN POTASSIUM-HCTZ 100-25 MG PO TABS: 1.0000 | ORAL_TABLET | Freq: Every day | ORAL | Status: DC

## 2013-07-25 NOTE — Patient Instructions (Signed)
Get your blood work before you leave   Check the  blood pressure   Weekly  be sure it is between 110/60 and 140/85. Ideal blood pressure is 120/80. If it is consistently higher or lower, let me know   Next visit is for a physical exam in 5 months   fasting Please make an appointment

## 2013-07-25 NOTE — Assessment & Plan Note (Signed)
Well control on Nexium and H2 blockers. Will need a PA at some point

## 2013-07-25 NOTE — Assessment & Plan Note (Signed)
Compliance with medications, labs

## 2013-07-25 NOTE — Progress Notes (Signed)
Subjective:    Patient ID: Casey Bryan, female    DOB: [birth year], 68 y.o.   MRN: [id number]  DOS:  07/25/2013 Type of  Visit: ROV, here w/ daughter  Hypertension, BP today slightly elevated, ambulatory BPs consistently normal. GERD, on Nexium , H2 blockers, doing well. Continue  Feeling  fatigue, described as simply lack of energy. She admits to mild snoring but not feeling sleepy throughout the day. Diabetes, has improved her diet to some extent, ambulatory CBGs always less than 155.    ROS Denies chest pain or dyspnea on exertion No nausea, vomiting, diarrhea. Very seldom has blood after bowel movement, thinks related to hemorrhoids.   Past Medical History  Diagnosis Date  . Hyperlipemia   . Hypertension   . GERD (gastroesophageal reflux disease)   . Diabetes mellitus   . Glaucoma 04-2012    Past Surgical History  Procedure Laterality Date  . No past surgeries      History   Social History  . Marital Status: Married    Spouse Name: N/A    Number of Children: 3  . Years of Education: N/A   Occupational History  . own busines, accountant    Social History Main Topics  . Smoking status: Never Smoker   . Smokeless tobacco: Not on file  . Alcohol Use: Yes     Comment: socially  . Drug Use: No  . Sexual Activity: Not on file   Other Topics Concern  . Not on file   Social History Narrative   Original [place]         Medication List       This list is accurate as of: 07/25/13  5:36 PM.  Always use your most recent med list.               ADVOCATE LANCETS Misc  Check BS 1x/day dx 250.00     ADVOCATE REDI-CODE Devi  by Does not apply route. Dx 250.00     B-D LANCET DEVICE Misc  B-D Ultra-Fine 22 lancets. Test blood sugar daily. Diagnosis code: 250.00     CALCIUM 1200 PO  Take 1,000 mg by mouth.     esomeprazole 40 MG capsule  Commonly known as:  NEXIUM  Take 1 capsule (40 mg total) by mouth daily.     EYE VITAMINS Caps  Take 1 capsule by  mouth daily.     ezetimibe-simvastatin 10-10 MG per tablet  Commonly known as:  VYTORIN  TAKE 1 TABLET BY MOUTH EVERY NIGHT AT BEDTIME     lidocaine 5 % ointment  Commonly known as:  XYLOCAINE  Apply 1 application topically daily.     losartan-hydrochlorothiazide 100-25 MG per tablet  Commonly known as:  HYZAAR  Take 1 tablet by mouth daily.     metoprolol 50 MG tablet  Commonly known as:  LOPRESSOR  TAKE 1 TABLET BY MOUTH TWICE DAILY     multivitamin tablet  Take 1 tablet by mouth daily.     ONE TOUCH ULTRA TEST test strip  Generic drug:  glucose blood  CHECK ONCE DAILY     ranitidine 150 MG capsule  Commonly known as:  ZANTAC  Take 150 mg by mouth daily as needed.           Objective:   Physical Exam BP 155/64  Pulse 60  Temp(Src) 98.2 F (36.8 C)  Wt 222 lb (100.699 kg)  SpO2 99%  General -- alert, well-developed, NAD.  Lungs --  normal respiratory effort, no intercostal retractions, no accessory muscle use, and normal breath sounds.  Heart-- normal rate, regular rhythm, no murmur.  DIABETIC FEET EXAM: No lower extremity edema Normal pedal pulses bilaterally Skin normal, nails normal, no calluses Pinprick examination of the feet normal. Neurologic--  alert & oriented X3. Speech normal, gait normal, strength normal in all extremities.  Psych-- Cognition and judgment appear intact. Cooperative with normal attention span and concentration. No anxious or depressed appearing.       Assessment & Plan:

## 2013-07-25 NOTE — Assessment & Plan Note (Signed)
BP slightly elevated today but well controlled at home, continue monitoring. We'll consolidate hyzaar 50-12.5 two tablets a day one tablet daily.

## 2013-07-25 NOTE — Progress Notes (Signed)
Pre visit review using our clinic review tool, if applicable. No additional management support is needed unless otherwise documented below in the visit note. 

## 2013-07-25 NOTE — Assessment & Plan Note (Signed)
Doing about the same, will check a T55, folic acid. Previous CBC and vitamin D normal. When he took about the screening for sleep apnea, patient is  hesitant. Will discuss again to return to the office

## 2013-07-25 NOTE — Assessment & Plan Note (Signed)
Feet exam normal today. Has make some progress on diet, encourage increase exercise. Check the A1c

## 2013-07-27 ENCOUNTER — Encounter: Payer: Self-pay | Admitting: *Deleted

## 2013-08-07 ENCOUNTER — Telehealth: Payer: Self-pay

## 2013-08-07 NOTE — Telephone Encounter (Signed)
Relevant patient education assigned to patient using Emmi. ° °

## 2013-11-02 ENCOUNTER — Ambulatory Visit (INDEPENDENT_AMBULATORY_CARE_PROVIDER_SITE_OTHER): Payer: Medicare Other | Admitting: Internal Medicine

## 2013-11-02 ENCOUNTER — Encounter: Payer: Self-pay | Admitting: Internal Medicine

## 2013-11-02 VITALS — BP 145/70 | HR 60 | Temp 98.2°F | Wt 224.0 lb

## 2013-11-02 DIAGNOSIS — M542 Cervicalgia: Secondary | ICD-10-CM | POA: Diagnosis not present

## 2013-11-02 MED ORDER — PREDNISONE 10 MG PO TABS: ORAL_TABLET | ORAL | Status: DC

## 2013-11-02 MED ORDER — CYCLOBENZAPRINE HCL 10 MG PO TABS: 10.0000 mg | ORAL_TABLET | Freq: Every evening | ORAL | Status: DC | PRN

## 2013-11-02 NOTE — Progress Notes (Signed)
Subjective:    Patient ID: Casey Bryan, female    DOB: [birth year], 68 y.o.   MRN: [id number]  DOS:  11/02/2013 Type of visit - description: acute, here w/ daughter  History: Symptoms started 5 days ago, pain at the nuchal and posterior neck area with radiation to the left arm up to the middle finger and some variation between the shoulder blades. The radiation has decreased and is now going only up to the elbow   ROS Denies any rash. No lower extremity paresthesias, no difficulty with her gait. No bladder or bowel incontinence No injury but she does lift her grandkids from time to time  Past Medical History  Diagnosis Date  . Hyperlipemia   . Hypertension   . GERD (gastroesophageal reflux disease)   . Diabetes mellitus   . Glaucoma 04-2012    Past Surgical History  Procedure Laterality Date  . No past surgeries      History   Social History  . Marital Status: Married    Spouse Name: N/A    Number of Children: 3  . Years of Education: N/A   Occupational History  . own busines, accountant    Social History Main Topics  . Smoking status: Never Smoker   . Smokeless tobacco: Not on file  . Alcohol Use: Yes     Comment: socially  . Drug Use: No  . Sexual Activity: Not on file   Other Topics Concern  . Not on file   Social History Narrative   Original [place]         Medication List       This list is accurate as of: 11/02/13 11:59 PM.  Always use your most recent med list.               ADVOCATE LANCETS Misc  Check BS 1x/day dx 250.00     ADVOCATE REDI-CODE Devi  by Does not apply route. Dx 250.00     B-D LANCET DEVICE Misc  B-D Ultra-Fine 22 lancets. Test blood sugar daily. Diagnosis code: 250.00     CALCIUM 1200 PO  Take 1,000 mg by mouth.     cyclobenzaprine 10 MG tablet  Commonly known as:  FLEXERIL  Take 1 tablet (10 mg total) by mouth at bedtime as needed for muscle spasms.     esomeprazole 40 MG capsule  Commonly known as:   NEXIUM  Take 1 capsule (40 mg total) by mouth daily.     EYE VITAMINS Caps  Take 1 capsule by mouth daily.     ezetimibe-simvastatin 10-10 MG per tablet  Commonly known as:  VYTORIN  TAKE 1 TABLET BY MOUTH EVERY NIGHT AT BEDTIME     lidocaine 5 % ointment  Commonly known as:  XYLOCAINE  Apply 1 application topically daily.     losartan-hydrochlorothiazide 100-25 MG per tablet  Commonly known as:  HYZAAR  Take 1 tablet by mouth daily.     metoprolol 50 MG tablet  Commonly known as:  LOPRESSOR  TAKE 1 TABLET BY MOUTH TWICE DAILY     multivitamin tablet  Take 1 tablet by mouth daily.     ONE TOUCH ULTRA TEST test strip  Generic drug:  glucose blood  CHECK ONCE DAILY     predniSONE 10 MG tablet  Commonly known as:  DELTASONE  3 tabs x 3 days, 2 tabs x 3 days, 1 tab x 3 days     ranitidine 150 MG capsule  Commonly  known as:  ZANTAC  Take 150 mg by mouth daily as needed.           Objective:   Physical Exam  Neck:     BP 145/70  Pulse 60  Temp(Src) 98.2 F (36.8 C)  Wt 224 lb (101.606 kg)  SpO2 96% General -- alert, well-developed, NAD.  Neck --slt TTP see graphic, ROM full, some pain elicited when turns to the L HEENT-- Not pale.   Extremities-- no pretibial edema bilaterally  Neurologic--  alert & oriented X3. Speech normal, gait appropriate for age, strength symmetric and appropriate for age.  DTRs symmetric. Psych-- Cognition and judgment appear intact. Cooperative with normal attention span and concentration. No anxious or depressed appearing.        Assessment & Plan:   Neck pain, 5 days history of neck pain with some radicular features, will treat conservatively. She has diabetes, ambulatory CBGs usually 120, will prescribe prednisone with close monitoring of her CBGs, see instructions

## 2013-11-02 NOTE — Progress Notes (Signed)
Pre visit review using our clinic review tool, if applicable. No additional management support is needed unless otherwise documented below in the visit note. 

## 2013-11-02 NOTE — Patient Instructions (Signed)
Take prednisone as prescribed Flexeril, a muscle relaxant at nighttime, will make you drowsy Tylenol  500 mg OTC 2 tabs a day every 8 hours as needed for pain   Prednisone may increase her blood sugar, check your blood sugars once or twice a day, if they are more than 180, 190: Stop prednisone, let me know Call if not improving in the next 2 weeks

## 2013-11-27 ENCOUNTER — Other Ambulatory Visit: Payer: Self-pay | Admitting: Internal Medicine

## 2013-12-26 ENCOUNTER — Ambulatory Visit (INDEPENDENT_AMBULATORY_CARE_PROVIDER_SITE_OTHER): Payer: Medicare Other | Admitting: Internal Medicine

## 2013-12-26 ENCOUNTER — Encounter: Payer: Self-pay | Admitting: Internal Medicine

## 2013-12-26 VITALS — BP 128/68 | HR 73 | Temp 98.2°F | Ht 63.0 in | Wt 218.5 lb

## 2013-12-26 DIAGNOSIS — E559 Vitamin D deficiency, unspecified: Secondary | ICD-10-CM

## 2013-12-26 DIAGNOSIS — Z1231 Encounter for screening mammogram for malignant neoplasm of breast: Secondary | ICD-10-CM

## 2013-12-26 DIAGNOSIS — L989 Disorder of the skin and subcutaneous tissue, unspecified: Secondary | ICD-10-CM | POA: Insufficient documentation

## 2013-12-26 DIAGNOSIS — I10 Essential (primary) hypertension: Secondary | ICD-10-CM

## 2013-12-26 DIAGNOSIS — E119 Type 2 diabetes mellitus without complications: Secondary | ICD-10-CM | POA: Diagnosis not present

## 2013-12-26 DIAGNOSIS — Z1239 Encounter for other screening for malignant neoplasm of breast: Secondary | ICD-10-CM

## 2013-12-26 DIAGNOSIS — Z23 Encounter for immunization: Secondary | ICD-10-CM

## 2013-12-26 DIAGNOSIS — M542 Cervicalgia: Secondary | ICD-10-CM

## 2013-12-26 DIAGNOSIS — Z0000 Encounter for general adult medical examination without abnormal findings: Secondary | ICD-10-CM

## 2013-12-26 LAB — CBC WITH DIFFERENTIAL/PLATELET
Basophils Absolute: 0 10*3/uL (ref 0.0–0.1)
Basophils Relative: 0.7 % (ref 0.0–3.0)
EOS PCT: 1.3 % (ref 0.0–5.0)
Eosinophils Absolute: 0.1 10*3/uL (ref 0.0–0.7)
HEMATOCRIT: 38.2 % (ref 36.0–46.0)
Hemoglobin: 12.6 g/dL (ref 12.0–15.0)
LYMPHS ABS: 2 10*3/uL (ref 0.7–4.0)
LYMPHS PCT: 36.6 % (ref 12.0–46.0)
MCHC: 33 g/dL (ref 30.0–36.0)
MCV: 83.6 fl (ref 78.0–100.0)
MONOS PCT: 6.3 % (ref 3.0–12.0)
Monocytes Absolute: 0.3 10*3/uL (ref 0.1–1.0)
NEUTROS PCT: 55.1 % (ref 43.0–77.0)
Neutro Abs: 2.9 10*3/uL (ref 1.4–7.7)
PLATELETS: 211 10*3/uL (ref 150.0–400.0)
RBC: 4.57 Mil/uL (ref 3.87–5.11)
RDW: 14.4 % (ref 11.5–15.5)
WBC: 5.4 10*3/uL (ref 4.0–10.5)

## 2013-12-26 LAB — ALT: ALT: 15 U/L (ref 0–35)

## 2013-12-26 LAB — HEMOGLOBIN A1C: HEMOGLOBIN A1C: 7.3 % — AB (ref 4.6–6.5)

## 2013-12-26 LAB — AST: AST: 19 U/L (ref 0–37)

## 2013-12-26 NOTE — Progress Notes (Signed)
Subjective:    Patient ID: Casey Bryan, female    DOB: January 01, 1946, 68 y.o.   MRN: [id number]  DOS:  12/26/2013 Type of visit - description :   Here for Medicare AWV, here with her daughter :   1. Risk factors based on Past M, S, F history: reviewed   2. Physical Activities: Active at home, occ takes a walk  3. Depression/mood: Neg screening 4. Hearing: some decreased hearing, not affecting normal conversation, no tinnitus, rec to call if a referral is needed.   5. ADL's: independent   6. Fall Risk: no recent falls, prevention discussed   7. home Safety: does feel safe at home   8. Height, weight, &visual acuity: see VS, has early cataracts/glaucoma, sees the eye doctor regualrly 9. Counseling: provided   10. Labs ordered based on risk factors: if needed   11. Referral Coordination: if needed   12. Care Plan, see assessment and plan   13. Cognitive Assessment: Motor skills and cognition within normal   In addition, today we discussed the following: Diabetes, on diet control, ambulatory CBGs 120, 160 Hypertension, good medication compliance, no ambulatory BPs High cholesterol, on Vytorin, no apparent side effects Was recently seen with neck pain, status post prednisone, the pain continued to be about the same, there is a point at the nuchal area that is  TTP. Has a skin lesion at the left shoulder , last week it bleed, patient concerned  ROS Denies chest pain or difficulty breathing No  nausea, vomiting, diarrhea. Occasionally sees blood per rectum (has hemorrhoids). No vaginal discharge or bleeding No dysuria, gross hematuria or difficulty urinating  Past Medical History  Diagnosis Date  . Hyperlipemia   . Hypertension   . GERD (gastroesophageal reflux disease)   . Diabetes mellitus   . Glaucoma 04-2012    Past Surgical History  Procedure Laterality Date  . No past surgeries      History   Social History  . Marital Status: Married    Spouse Name: N/A    Number of  Children: 3  . Years of Education: N/A   Occupational History  . semi-retired,own a busines, Optometrist   .     Social History Main Topics  . Smoking status: Never Smoker   . Smokeless tobacco: Never Used  . Alcohol Use: Yes     Comment: socially  . Drug Use: No  . Sexual Activity: Not on file   Other Topics Concern  . Not on file   Social History Narrative   Original [place]      Family History  Problem Relation Age of Onset  . Diabetes Mother     uncles, mother , father   . Colon cancer Neg Hx   . Breast cancer Neg Hx   . Coronary artery disease      F age 42, B age 61, uncle age 45 : MI       Medication List       This list is accurate as of: 12/26/13 11:59 PM.  Always use your most recent med list.               ADVOCATE LANCETS Misc  Check BS 1x/day dx 250.00     ADVOCATE REDI-CODE Devi  by Does not apply route. Dx 250.00     B-D LANCET DEVICE Misc  B-D Ultra-Fine 22 lancets. Test blood sugar daily. Diagnosis code: 250.00     CALCIUM 1200 PO  Take 1,000  mg by mouth.     cyclobenzaprine 10 MG tablet  Commonly known as:  FLEXERIL  Take 1 tablet (10 mg total) by mouth at bedtime as needed for muscle spasms.     esomeprazole 40 MG capsule  Commonly known as:  NEXIUM  TAKE 1 CAPSULE BY MOUTH EVERY DAY     EYE VITAMINS Caps  Take 1 capsule by mouth daily.     ezetimibe-simvastatin 10-10 MG per tablet  Commonly known as:  VYTORIN  TAKE 1 TABLET BY MOUTH EVERY NIGHT AT BEDTIME     lidocaine 5 % ointment  Commonly known as:  XYLOCAINE  Apply 1 application topically daily.     losartan-hydrochlorothiazide 100-25 MG per tablet  Commonly known as:  HYZAAR  Take 1 tablet by mouth daily.     metoprolol 50 MG tablet  Commonly known as:  LOPRESSOR  TAKE 1 TABLET BY MOUTH TWICE DAILY     multivitamin tablet  Take 1 tablet by mouth daily.     ONE TOUCH ULTRA TEST test strip  Generic drug:  glucose blood  CHECK ONCE DAILY     ranitidine 150  MG capsule  Commonly known as:  ZANTAC  Take 150 mg by mouth daily as needed.           Objective:   Physical Exam  Neck:    Skin:      BP 128/68  Pulse 73  Temp(Src) 98.2 F (36.8 C) (Oral)  Ht 5\' 3"  (1.6 m)  Wt 218 lb 8 oz (99.111 kg)  BMI 38.72 kg/m2  SpO2 97% General -- alert, well-developed, NAD.  Neck --no thyromegaly  HEENT-- Not pale.  Lungs -- normal respiratory effort, no intercostal retractions, no accessory muscle use, and normal breath sounds.  Heart-- normal rate, regular rhythm, no murmur.  Abdomen-- Not distended, good bowel sounds,soft, non-tender. No bruit or mass Extremities-- no pretibial edema bilaterally  Neurologic--  alert & oriented X3. Speech normal, gait appropriate for age, strength symmetric and appropriate for age.  Psych-- Cognition and judgment appear intact. Cooperative with normal attention span and concentration. No anxious or depressed appearing.      Assessment & Plan:

## 2013-12-26 NOTE — Assessment & Plan Note (Addendum)
Well-controlled, recent BMP satisfactory, no change. EKG -- Normal sinus rhythm, no change compared to previous

## 2013-12-26 NOTE — Assessment & Plan Note (Signed)
Recently she has some bleeding from a skin lesion, likely seborrheic keratoses but because the area looks slightly irritated and bleed I recommend excision. Recommend patient to come back next week

## 2013-12-26 NOTE — Patient Instructions (Addendum)
Get your blood work before you leave    Stop by the front desk and schedule a visit for next week for the excision of the skin lesion  Get a flu shot this season  Next routine visit in 4-5 months      Fall Prevention and [place] cause injuries and can affect all age groups. It is possible to use preventive measures to significantly decrease the likelihood of falls. There are many simple measures which can make your home safer and prevent falls. OUTDOORS  Repair cracks and edges of walkways and driveways.  Remove high doorway thresholds.  Trim shrubbery on the main path into your home.  Have good outside lighting.  Clear walkways of tools, rocks, debris, and clutter.  Check that handrails are not broken and are securely fastened. Both sides of steps should have handrails.  Have leaves, snow, and ice cleared regularly.  Use sand or salt on walkways during winter months.  In the garage, clean up grease or oil spills. BATHROOM  Install night lights.  Install grab bars by the toilet and in the tub and shower.  Use non-skid mats or decals in the tub or shower.  Place a plastic non-slip stool in the shower to sit on, if needed.  Keep floors dry and clean up all water on the floor immediately.  Remove soap buildup in the tub or shower on a regular basis.  Secure bath mats with non-slip, double-sided rug tape.  Remove throw rugs and tripping hazards from the floors. BEDROOMS  Install night lights.  Make sure a bedside light is easy to reach.  Do not use oversized bedding.  Keep a telephone by your bedside.  Have a firm chair with side arms to use for getting dressed.  Remove throw rugs and tripping hazards from the floor. KITCHEN  Keep handles on pots and pans turned toward the center of the stove. Use back burners when possible.  Clean up spills quickly and allow time for drying.  Avoid walking on wet floors.  Avoid hot utensils and  knives.  Position shelves so they are not too high or low.  Place commonly used objects within easy reach.  If necessary, use a sturdy step stool with a grab bar when reaching.  Keep electrical cables out of the way.  Do not use floor polish or wax that makes floors slippery. If you must use wax, use non-skid floor wax.  Remove throw rugs and tripping hazards from the floor. STAIRWAYS  Never leave objects on stairs.  Place handrails on both sides of stairways and use them. Fix any loose handrails. Make sure handrails on both sides of the stairways are as long as the stairs.  Check carpeting to make sure it is firmly attached along stairs. Make repairs to worn or loose carpet promptly.  Avoid placing throw rugs at the top or bottom of stairways, or properly secure the rug with carpet tape to prevent slippage. Get rid of throw rugs, if possible.  Have an electrician put in a light switch at the top and bottom of the stairs. OTHER FALL PREVENTION TIPS  Wear low-heel or rubber-soled shoes that are supportive and fit well. Wear closed toe shoes.  When using a stepladder, make sure it is fully opened and both spreaders are firmly locked. Do not climb a closed stepladder.  Add color or contrast paint or tape to grab bars and handrails in your home. Place contrasting color strips on first and last  steps.  Learn and use mobility aids as needed. Install an electrical emergency response system.  Turn on lights to avoid dark areas. Replace light bulbs that burn out immediately. Get light switches that glow.  Arrange furniture to create clear pathways. Keep furniture in the same place.  Firmly attach carpet with non-skid or double-sided tape.  Eliminate uneven floor surfaces.  Select a carpet pattern that does not visually hide the edge of steps.  Be aware of all pets. OTHER HOME SAFETY TIPS  Set the water temperature for 120 F (48.8 C).  Keep emergency numbers on or near the  telephone.  Keep smoke detectors on every level of the home and near sleeping areas. Document Released: 03/26/2002 Document Revised: 10/05/2011 Document Reviewed: 06/25/2011 [facility] Patient Information 2015 [place], [place]. This information is not intended to replace advice given to you by your health care provider. Make sure you discuss any questions you have with your health care provider.

## 2013-12-26 NOTE — Assessment & Plan Note (Addendum)
Td 2011 Pneumonia 2008 prevnar today Zostavax benefits discussed before  rec flu shot   Has a family history of heart disease and carotid disease, reluctant to take aspirin .Had  a normal carotid ultrasound 10/01/2008.  cscope Dr Oletta Lamas ~ 2008, normal, cscope again 12-2011, + polyps, was told repeat in 3 years  MMG 11-2011 neg , will schedule one  PAP 07-2010 normal; no history of abnormal Paps, is very low risk. Next PAP due, refer to gyn (female) Bone density test was done at gynecology ~ 2012, reportedly normal, no records.Does take  daily calcium and vitamin D. Repeat DEXA next year

## 2013-12-26 NOTE — Assessment & Plan Note (Signed)
On diet control only, check a A1c, encourage a healthy diet and exercise

## 2013-12-26 NOTE — Progress Notes (Signed)
Pre visit review using our clinic review tool, if applicable. No additional management support is needed unless otherwise documented below in the visit note. 

## 2013-12-26 NOTE — Assessment & Plan Note (Signed)
Has neck pain that originates from a tender point at the nuchal area, not better w/ Prednisone. Occipital neuralgia?. Refer to neurology.

## 2013-12-26 NOTE — Assessment & Plan Note (Signed)
Last vitamin D satisfactory

## 2013-12-31 ENCOUNTER — Other Ambulatory Visit: Payer: Self-pay

## 2013-12-31 MED ORDER — METFORMIN HCL 500 MG PO TABS: ORAL_TABLET | ORAL | Status: DC

## 2014-01-01 ENCOUNTER — Telehealth: Payer: Self-pay | Admitting: Internal Medicine

## 2014-01-01 NOTE — Telephone Encounter (Signed)
Caller name: Sherlean Foot Relation to pt: daughter  Call back number:  [phone]   Reason for call:  Pt daughter is inquiring about mother lab results needs to explain to her mother.

## 2014-01-01 NOTE — Telephone Encounter (Signed)
Spoke with Pts daughter Christiana Fuchs, she would like to know if her mother can be changed to Metformin Extended Release or another medication that does not cause stomach upset because her mother already has stomach issues and she is afraid that metformin will cause stomach upset.   Please advise.

## 2014-01-02 ENCOUNTER — Ambulatory Visit (INDEPENDENT_AMBULATORY_CARE_PROVIDER_SITE_OTHER): Payer: Medicare Other | Admitting: Internal Medicine

## 2014-01-02 ENCOUNTER — Encounter: Payer: Self-pay | Admitting: Internal Medicine

## 2014-01-02 VITALS — BP 122/72 | HR 64 | Temp 98.2°F | Wt 218.1 lb

## 2014-01-02 DIAGNOSIS — Z0000 Encounter for general adult medical examination without abnormal findings: Secondary | ICD-10-CM

## 2014-01-02 DIAGNOSIS — L989 Disorder of the skin and subcutaneous tissue, unspecified: Secondary | ICD-10-CM

## 2014-01-02 DIAGNOSIS — B079 Viral wart, unspecified: Secondary | ICD-10-CM

## 2014-01-02 DIAGNOSIS — E119 Type 2 diabetes mellitus without complications: Secondary | ICD-10-CM

## 2014-01-02 MED ORDER — METFORMIN HCL ER (MOD) 1000 MG PO TB24: ORAL_TABLET | ORAL | Status: DC

## 2014-01-02 NOTE — Telephone Encounter (Signed)
Pt has appt today at 10:00

## 2014-01-02 NOTE — Addendum Note (Signed)
Addended by: Wilfrid Lund on: 01/02/2014 11:17 AM   Modules accepted: Orders, Medications

## 2014-01-02 NOTE — Addendum Note (Signed)
Addended by: Wilfrid Lund on: 01/02/2014 11:54 AM   Modules accepted: Orders

## 2014-01-02 NOTE — Progress Notes (Signed)
Pre visit review using our clinic review tool, if applicable. No additional management support is needed unless otherwise documented below in the visit note. 

## 2014-01-02 NOTE — Progress Notes (Signed)
Subjective:    Patient ID: Casey Bryan, female    DOB: [birth year], 68 y.o.   MRN: [id number]  DOS:  01/02/2014 Type of visit - description : Here with her daughter for a skin lesion excision Interval history: Since the last time she was here, she had a reaction to Prevnar. Nausea, vomiting, F upt 100.2 , sx lasted 3 days Also likes the extended release metformin which is prescribed today   ROS   Past Medical History  Diagnosis Date  . Hyperlipemia   . Hypertension   . GERD (gastroesophageal reflux disease)   . Diabetes mellitus   . Glaucoma 04-2012    Past Surgical History  Procedure Laterality Date  . No past surgeries      History   Social History  . Marital Status: Married    Spouse Name: N/A    Number of Children: 3  . Years of Education: N/A   Occupational History  . semi-retired,own a busines, Optometrist   .     Social History Main Topics  . Smoking status: Never Smoker   . Smokeless tobacco: Never Used  . Alcohol Use: Yes     Comment: socially  . Drug Use: No  . Sexual Activity: Not on file   Other Topics Concern  . Not on file   Social History Narrative   Original [place]         Medication List       This list is accurate as of: 01/02/14 11:59 PM.  Always use your most recent med list.               ADVOCATE LANCETS Misc  Check BS 1x/day dx 250.00     ADVOCATE REDI-CODE Devi  by Does not apply route. Dx 250.00     B-D LANCET DEVICE Misc  B-D Ultra-Fine 22 lancets. Test blood sugar daily. Diagnosis code: 250.00     CALCIUM 1200 PO  Take 1,000 mg by mouth.     cyclobenzaprine 10 MG tablet  Commonly known as:  FLEXERIL  Take 1 tablet (10 mg total) by mouth at bedtime as needed for muscle spasms.     esomeprazole 40 MG capsule  Commonly known as:  NEXIUM  TAKE 1 CAPSULE BY MOUTH EVERY DAY     EYE VITAMINS Caps  Take 1 capsule by mouth daily.     ezetimibe-simvastatin 10-10 MG per tablet  Commonly known as:  VYTORIN    TAKE 1 TABLET BY MOUTH EVERY NIGHT AT BEDTIME     lidocaine 5 % ointment  Commonly known as:  XYLOCAINE  Apply 1 application topically daily.     losartan-hydrochlorothiazide 100-25 MG per tablet  Commonly known as:  HYZAAR  Take 1 tablet by mouth daily.     metoprolol 50 MG tablet  Commonly known as:  LOPRESSOR  TAKE 1 TABLET BY MOUTH TWICE DAILY     multivitamin tablet  Take 1 tablet by mouth daily.     ONE TOUCH ULTRA TEST test strip  Generic drug:  glucose blood  CHECK ONCE DAILY     ranitidine 150 MG capsule  Commonly known as:  ZANTAC  Take 150 mg by mouth daily as needed.           Objective:   Physical Exam  Skin:      BP 122/72  Pulse 64  Temp(Src) 98.2 F (36.8 C) (Oral)  Wt 218 lb 2 oz (98.941 kg)  SpO2 99%  Assessment & Plan:   Procedure note Indication --irritated 6 mm L shoulder  skin lesion, SK? In a sterile fashion and under local anesthesia with lidocaine 2% without epinephrine (approximately 3/4  Mls)  I did a 1 cm incision, remove the lesion, it was sent to pathology. 2 stitches placed.  Addendum, pathology benign.  Return to the office in one week for stitch removal Keep the area clean, dry, use antibiotic ointment Call anytime if signs of infection.

## 2014-01-02 NOTE — Telephone Encounter (Signed)
Change to Metformin XR 1000 mg : Half tablet the first 2 weeks, then one tablet every morning.

## 2014-01-02 NOTE — Patient Instructions (Signed)
Keep the area clean and dry, covered with a Band-Aid. Use  an antibiotic ointment daily Come back in one week for stitch removal Call anytime if redness, discharge, fever or chills

## 2014-01-02 NOTE — Telephone Encounter (Signed)
Metformin has been sent to Pharmacy.

## 2014-01-03 ENCOUNTER — Telehealth: Payer: Self-pay | Admitting: *Deleted

## 2014-01-03 DIAGNOSIS — E119 Type 2 diabetes mellitus without complications: Secondary | ICD-10-CM

## 2014-01-03 MED ORDER — METFORMIN HCL ER 500 MG PO TB24: ORAL_TABLET | ORAL | Status: DC

## 2014-01-03 NOTE — Telephone Encounter (Signed)
Received prior authorization for Glumetza 1000mg  via fax from [employer].  Called the pharmacy regarding the authorization.  The pharmacy stated that the Marcial Pacas is brand and insurance do not want to pay for the rx which is ($4000).   He stated if the doctor would write for Glucophage XR 500mg  #60, then the pt will only be charged $60 and the insurance will cover it.  Spoke with Truman Hayward) and gave him a verbal order for Glucophage XR 500mg -Take 1 tablet by mouth daily for 2 weeks,then 2 tablets by mouth daily- #60,5refills per Dr. Rolla Flatten

## 2014-01-07 NOTE — Assessment & Plan Note (Signed)
Had a reaction to Prevnar.

## 2014-01-07 NOTE — Assessment & Plan Note (Signed)
Likes to change metformin to the extended release version. New prescription provided

## 2014-01-09 ENCOUNTER — Encounter: Payer: Self-pay | Admitting: Internal Medicine

## 2014-01-09 ENCOUNTER — Ambulatory Visit: Payer: Medicare Other | Admitting: Internal Medicine

## 2014-01-09 VITALS — BP 128/62 | HR 61 | Temp 97.7°F | Wt 218.2 lb

## 2014-01-09 DIAGNOSIS — L989 Disorder of the skin and subcutaneous tissue, unspecified: Secondary | ICD-10-CM

## 2014-01-09 NOTE — Progress Notes (Signed)
Pre visit review using our clinic review tool, if applicable. No additional management support is needed unless otherwise documented below in the visit note. 

## 2014-01-09 NOTE — Progress Notes (Signed)
Subjective:    Patient ID: Casey Bryan, female    DOB: 1946/03/24, 68 y.o.   MRN: [id number]  DOS:  01/09/2014 Type of visit - description : Followup Interval history: Here for stitch removal, has noted no complications   ROS   Past Medical History  Diagnosis Date  . Hyperlipemia   . Hypertension   . GERD (gastroesophageal reflux disease)   . Diabetes mellitus   . Glaucoma 04-2012    Past Surgical History  Procedure Laterality Date  . No past surgeries      History   Social History  . Marital Status: Married    Spouse Name: N/A    Number of Children: 3  . Years of Education: N/A   Occupational History  . semi-retired,own a busines, Optometrist   .     Social History Main Topics  . Smoking status: Never Smoker   . Smokeless tobacco: Never Used  . Alcohol Use: Yes     Comment: socially  . Drug Use: No  . Sexual Activity: Not on file   Other Topics Concern  . Not on file   Social History Narrative   Original [place]         Medication List       This list is accurate as of: 01/09/14  3:06 PM.  Always use your most recent med list.               ADVOCATE LANCETS Misc  Check BS 1x/day dx 250.00     ADVOCATE REDI-CODE Devi  by Does not apply route. Dx 250.00     B-D LANCET DEVICE Misc  B-D Ultra-Fine 22 lancets. Test blood sugar daily. Diagnosis code: 250.00     CALCIUM 1200 PO  Take 1,000 mg by mouth.     cyclobenzaprine 10 MG tablet  Commonly known as:  FLEXERIL  Take 1 tablet (10 mg total) by mouth at bedtime as needed for muscle spasms.     esomeprazole 40 MG capsule  Commonly known as:  NEXIUM  TAKE 1 CAPSULE BY MOUTH EVERY DAY     EYE VITAMINS Caps  Take 1 capsule by mouth daily.     ezetimibe-simvastatin 10-10 MG per tablet  Commonly known as:  VYTORIN  TAKE 1 TABLET BY MOUTH EVERY NIGHT AT BEDTIME     lidocaine 5 % ointment  Commonly known as:  XYLOCAINE  Apply 1 application topically daily.     losartan-hydrochlorothiazide 100-25 MG per tablet  Commonly known as:  HYZAAR  Take 1 tablet by mouth daily.     metFORMIN 500 MG 24 hr tablet  Commonly known as:  GLUCOPHAGE XR  Take 1 tablet by mouth daily for 2 weeks, then 2 tablets by mouth daily.     metoprolol 50 MG tablet  Commonly known as:  LOPRESSOR  TAKE 1 TABLET BY MOUTH TWICE DAILY     multivitamin tablet  Take 1 tablet by mouth daily.     ONE TOUCH ULTRA TEST test strip  Generic drug:  glucose blood  CHECK ONCE DAILY     ranitidine 150 MG capsule  Commonly known as:  ZANTAC  Take 150 mg by mouth daily as needed.           Objective:   Physical Exam BP 128/62  Pulse 61  Temp(Src) 97.7 F (36.5 C) (Oral)  Wt 218 lb 4 oz (98.998 kg)  SpO2 97% Area of the sutures seems to be healing well  Assessment & Plan:   Stitches removal, pathology benign, pt aware

## 2014-01-15 ENCOUNTER — Ambulatory Visit
Admission: RE | Admit: 2014-01-15 | Discharge: 2014-01-15 | Disposition: A | Discharge status: Home / Self Care | Payer: Medicare Other | Source: Ambulatory Visit | Attending: Internal Medicine | Admitting: Internal Medicine

## 2014-01-15 DIAGNOSIS — Z1239 Encounter for other screening for malignant neoplasm of breast: Secondary | ICD-10-CM

## 2014-01-15 DIAGNOSIS — Z1231 Encounter for screening mammogram for malignant neoplasm of breast: Secondary | ICD-10-CM | POA: Diagnosis not present

## 2014-01-17 ENCOUNTER — Other Ambulatory Visit: Payer: Self-pay | Admitting: Obstetrics & Gynecology

## 2014-01-17 DIAGNOSIS — Z124 Encounter for screening for malignant neoplasm of cervix: Secondary | ICD-10-CM | POA: Diagnosis not present

## 2014-01-17 DIAGNOSIS — Z01419 Encounter for gynecological examination (general) (routine) without abnormal findings: Secondary | ICD-10-CM | POA: Diagnosis not present

## 2014-01-18 LAB — CYTOLOGY - PAP

## 2014-01-20 ENCOUNTER — Other Ambulatory Visit: Payer: Self-pay | Admitting: Internal Medicine

## 2014-01-24 ENCOUNTER — Telehealth: Payer: Self-pay | Admitting: Internal Medicine

## 2014-01-24 NOTE — Telephone Encounter (Signed)
error 

## 2014-01-25 ENCOUNTER — Telehealth: Payer: Self-pay | Admitting: Internal Medicine

## 2014-01-25 MED ORDER — EZETIMIBE-SIMVASTATIN 10-10 MG PO TABS: ORAL_TABLET | ORAL | Status: DC

## 2014-01-25 NOTE — Telephone Encounter (Signed)
Vytorin added back to Pts medication list and sent into [place].

## 2014-01-25 NOTE — Telephone Encounter (Signed)
Caller name: Traniya Relation to pt: daughter  Call back number:  [phone]   Reason for call: daughter needs clarification of mother medication, pharmacy informed her medication was d/c. Daughter would look like to discuss all meds

## 2014-01-28 ENCOUNTER — Telehealth: Payer: Self-pay

## 2014-01-28 NOTE — Telephone Encounter (Signed)
Spoke with Christiana Fuchs, informed her that Pt was supposed to be taking Vytorin.

## 2014-01-28 NOTE — Telephone Encounter (Signed)
[name] Daughter [phone]  Christiana Fuchs called to see why she had not heard from anyone from her phone call last week. I looked up where she had called and told her that the note said they had called her mom's ezetimibe-simvastatin (VYTORIN) 10-10 MG per tablet in to the [facility]. She stated is she supposed to be taking it now. She would really like for someone to call her and go over her mom's medicines.

## 2014-02-06 ENCOUNTER — Ambulatory Visit: Payer: Medicare Other | Admitting: Neurology

## 2014-02-18 DIAGNOSIS — H3531 Nonexudative age-related macular degeneration: Secondary | ICD-10-CM | POA: Diagnosis not present

## 2014-02-18 DIAGNOSIS — E119 Type 2 diabetes mellitus without complications: Secondary | ICD-10-CM | POA: Diagnosis not present

## 2014-02-18 DIAGNOSIS — H2513 Age-related nuclear cataract, bilateral: Secondary | ICD-10-CM | POA: Diagnosis not present

## 2014-02-18 LAB — HM DIABETES EYE EXAM

## 2014-02-19 ENCOUNTER — Other Ambulatory Visit: Payer: Self-pay | Admitting: *Deleted

## 2014-02-19 ENCOUNTER — Other Ambulatory Visit: Payer: Self-pay

## 2014-02-19 MED ORDER — OMEPRAZOLE 40 MG PO CPDR: 40.0000 mg | DELAYED_RELEASE_CAPSULE | Freq: Every day | ORAL | Status: DC

## 2014-02-28 ENCOUNTER — Ambulatory Visit (INDEPENDENT_AMBULATORY_CARE_PROVIDER_SITE_OTHER): Payer: Medicare Other | Admitting: Neurology

## 2014-02-28 ENCOUNTER — Encounter: Payer: Self-pay | Admitting: Neurology

## 2014-02-28 VITALS — BP 122/74 | HR 64 | Ht 63.0 in | Wt 221.0 lb

## 2014-02-28 DIAGNOSIS — M542 Cervicalgia: Secondary | ICD-10-CM

## 2014-02-28 DIAGNOSIS — M5412 Radiculopathy, cervical region: Secondary | ICD-10-CM

## 2014-02-28 MED ORDER — TIZANIDINE HCL 4 MG PO TABS: ORAL_TABLET | ORAL | Status: DC

## 2014-02-28 NOTE — Progress Notes (Signed)
NEUROLOGY CONSULTATION NOTE  Casey Bryan MRN: [id number] DOB: 01-Nov-1945  Referring provider: Dr. Larose Kells Primary care provider: Dr. Larose Kells  Reason for consult:  Neck pain  HISTORY OF PRESENT ILLNESS: Casey Bryan is a 68 year old right-handed woman with hypertension, type II diabetes, GERD, glaucoma, and vitamin D deficiency who presents for neck pain.  She is accompanied by her daughter.  Records reviewed.  In July, she developed pain in the left suboccipital region, radiating into the left shoulder and arm and into the middle finger.  There was some mild intermittent numbness in the hand as well.  There was no weakness. She denied preceding trigger or strenuous activity.  She received a prednisone taper, which was not effective.  She took cyclobenzaprine, which helped but made her sleepy.  She started using hot and cold compresses, which helped.  The pain resolved about 4 weeks ago.  PAST MEDICAL HISTORY: Past Medical History  Diagnosis Date  . Hyperlipemia   . Hypertension   . GERD (gastroesophageal reflux disease)   . Diabetes mellitus   . Glaucoma 04-2012    PAST SURGICAL HISTORY: Past Surgical History  Procedure Laterality Date  . No past surgeries      MEDICATIONS: Current Outpatient Prescriptions on File Prior to Visit  Medication Sig Dispense Refill  . Calcium Carbonate-Vit D-Min (CALCIUM 1200 PO) Take 1,000 mg by mouth.     . esomeprazole (NEXIUM) 40 MG capsule TAKE 1 CAPSULE BY MOUTH EVERY DAY 90 capsule 0  . ezetimibe-simvastatin (VYTORIN) 10-10 MG per tablet TAKE 1 TABLET BY MOUTH EVERY NIGHT AT BEDTIME 90 tablet 1  . lidocaine (XYLOCAINE) 5 % ointment Apply 1 application topically daily.    Marland Kitchen losartan-hydrochlorothiazide (HYZAAR) 100-25 MG per tablet Take 1 tablet by mouth daily. 90 tablet 3  . metFORMIN (GLUCOPHAGE XR) 500 MG 24 hr tablet Take 1 tablet by mouth daily for 2 weeks, then 2 tablets by mouth daily. 60 tablet 5  . metoprolol (LOPRESSOR) 50 MG tablet  TAKE 1 TABLET BY MOUTH TWICE DAILY 180 tablet 2  . Multiple Vitamin (MULTIVITAMIN) tablet Take 1 tablet by mouth daily.      . Multiple Vitamins-Minerals (EYE VITAMINS) CAPS Take 1 capsule by mouth daily.    . ranitidine (ZANTAC) 150 MG capsule Take 150 mg by mouth daily as needed.      Marland Kitchen ADVOCATE LANCETS MISC Check BS 1x/day dx 250.00 100 each 11  . Blood Glucose Monitoring Suppl (ADVOCATE REDI-CODE) [place] by Does not apply route. Dx 250.00     . Lancet Devices (B-D LANCET DEVICE) MISC B-D Ultra-Fine 22 lancets. Test blood sugar daily. Diagnosis code: 250.00 1 each 11  . ONE TOUCH ULTRA TEST test strip CHECK ONCE DAILY 100 each 11   No current facility-administered medications on file prior to visit.    ALLERGIES: Allergies  Allergen Reactions  . Prevnar [Pneumococcal 13-Val Conj Vacc] Nausea And Vomiting and Other (See Comments)    Fever of 102.   . Clarithromycin [Clarithromycin]     Contradictions w/Vytorin  . Penicillins   . Tetanus-Diphtheria Toxoids Td     REACTION: see OV note  02-11-10    FAMILY HISTORY: Family History  Problem Relation Age of Onset  . Diabetes Mother     uncles, mother , father   . Colon cancer Neg Hx   . Breast cancer Neg Hx   . Coronary artery disease      F age 67, B age 70, uncle age  45 : MI    SOCIAL HISTORY: History   Social History  . Marital Status: Married    Spouse Name: N/A    Number of Children: 3  . Years of Education: N/A   Occupational History  . semi-retired,own a busines, Optometrist   .     Social History Main Topics  . Smoking status: Never Smoker   . Smokeless tobacco: Never Used  . Alcohol Use: Yes     Comment: socially  . Drug Use: No  . Sexual Activity: Not on file   Other Topics Concern  . Not on file   Social History Narrative   Original [place]     REVIEW OF SYSTEMS: Constitutional: No fevers, chills, or sweats, no generalized fatigue, change in appetite Eyes: No visual changes, double vision, eye  pain Ear, nose and throat: No hearing loss, ear pain, nasal congestion, sore throat Cardiovascular: No chest pain, palpitations Respiratory:  No shortness of breath at rest or with exertion, wheezes GastrointestinaI: No nausea, vomiting, diarrhea, abdominal pain, fecal incontinence Genitourinary:  No dysuria, urinary retention or frequency Musculoskeletal:  No neck pain, back pain Integumentary: No rash, pruritus, skin lesions Neurological: as above Psychiatric: No depression, insomnia, anxiety Endocrine: No palpitations, fatigue, diaphoresis, mood swings, change in appetite, change in weight, increased thirst Hematologic/Lymphatic:  No anemia, purpura, petechiae. Allergic/Immunologic: no itchy/runny eyes, nasal congestion, recent allergic reactions, rashes  PHYSICAL EXAM: Filed Vitals:   02/28/14 1243  BP: 122/74  Pulse: 64   General: No acute distress Head:  Normocephalic/atraumatic Eyes:  fundi unremarkable, without vessel changes, exudates, hemorrhages or papilledema. CN III, IV, VI:  full range of motion, no nystagmus, no ptosis Neck: supple, left upper cervical paraspinal tenderness, full range of motion.   Back: No paraspinal tenderness Heart: regular rate and rhythm Lungs: Clear to auscultation bilaterally. Vascular: No carotid bruits. Neurological Exam: Mental status: alert and oriented to person, place, and time, recent and remote memory intact, fund of knowledge intact, attention and concentration intact, speech fluent and not dysarthric, language intact. Cranial nerves: CN I: not tested CN II: pupils equal, round and reactive to light, visual fields intact, fundi unremarkable, without vessel changes, exudates, hemorrhages or papilledema. CN III, IV, VI:  full range of motion, no nystagmus, no ptosis CN V: facial sensation intact CN VII: upper and lower face symmetric CN VIII: hearing intact CN IX, X: gag intact, uvula midline CN XI: sternocleidomastoid and trapezius  muscles intact CN XII: tongue midline Bulk & Tone: normal, no fasciculations. Motor:  5/5 throughout Sensation:  Pinprick and vibration intact Deep Tendon Reflexes:  2+ throughout, toes downgoing Finger to nose testing:  No dysmetria Gait:  Normal station and stride. Romberg negative.  IMPRESSION: Right cervical radiculopathy, possibly C7, resolved.  PLAN: 1.  Will prescribe tizanidine 2-4mg  for neck spasms as needed.   2.  If worsens, we can refer to physical therapy. 3.  Follow up as needed.  Thank you for allowing me to take part in the care of this patient.  Metta Clines, DO  CC:  Kathlene November, MD

## 2014-02-28 NOTE — Patient Instructions (Signed)
1.  If you start getting muscle tension or pain in the neck, take tizanidine 4mg , 1/2 tablet to 1 full tablet every 6 hours as needed for muscle spasms.  Caution as it may cause drowsiness. 2.  Call with questions or concerns.  Follow up as needed.

## 2014-03-19 ENCOUNTER — Other Ambulatory Visit: Payer: Self-pay

## 2014-03-27 ENCOUNTER — Ambulatory Visit: Payer: Medicare Other | Admitting: Neurology

## 2014-04-22 ENCOUNTER — Other Ambulatory Visit: Payer: Self-pay | Admitting: Internal Medicine

## 2014-04-25 ENCOUNTER — Other Ambulatory Visit: Payer: Self-pay | Admitting: Internal Medicine

## 2014-05-07 ENCOUNTER — Encounter: Payer: Self-pay | Admitting: Internal Medicine

## 2014-05-07 ENCOUNTER — Ambulatory Visit (INDEPENDENT_AMBULATORY_CARE_PROVIDER_SITE_OTHER): Payer: Medicare Other | Admitting: Internal Medicine

## 2014-05-07 VITALS — BP 138/80 | HR 63 | Temp 97.8°F | Ht 63.0 in | Wt 220.5 lb

## 2014-05-07 DIAGNOSIS — E119 Type 2 diabetes mellitus without complications: Secondary | ICD-10-CM

## 2014-05-07 DIAGNOSIS — I10 Essential (primary) hypertension: Secondary | ICD-10-CM | POA: Diagnosis not present

## 2014-05-07 DIAGNOSIS — K219 Gastro-esophageal reflux disease without esophagitis: Secondary | ICD-10-CM | POA: Diagnosis not present

## 2014-05-07 NOTE — Patient Instructions (Signed)
Get your blood work before you leave     Please come back to the office in 4-6 months for a routine check up   Come back fasting    Schedule the visit at the front desk

## 2014-05-07 NOTE — Assessment & Plan Note (Signed)
Seems well-controlled, continue with Hyzaar, metoprolol

## 2014-05-07 NOTE — Assessment & Plan Note (Signed)
Last A1c is slightly above 7, started metformin, good compliance and tolerance. Check A1c. Discuss need for improved diet and exercise as well

## 2014-05-07 NOTE — Progress Notes (Signed)
Subjective:    Patient ID: Casey Bryan, female    DOB: [birth year], 69 y.o.   MRN: [id number]  DOS:  05/07/2014 Type of visit - description : rov, here with her daughter Interval history: Medication list reviewed, good compliance, started metformin, no apparent side effects Ambulatory blood sugar between 103 and 155 Ambulatory BPs when checked reportedly very good.  ROS Denies chest pain or difficulty breathing. Mild edema at baseline No diarrhea Reflux symptoms not completely well control, see assessment and plan, denies dysphagia or odynophagia  Past Medical History  Diagnosis Date  . Hyperlipemia   . Hypertension   . GERD (gastroesophageal reflux disease)   . Diabetes mellitus   . Glaucoma 04-2012    Past Surgical History  Procedure Laterality Date  . No past surgeries      History   Social History  . Marital Status: Married    Spouse Name: N/A    Number of Children: 3  . Years of Education: N/A   Occupational History  . semi-retired,own a busines, Optometrist   .     Social History Main Topics  . Smoking status: Never Smoker   . Smokeless tobacco: Never Used  . Alcohol Use: Yes     Comment: socially  . Drug Use: No  . Sexual Activity: Not on file   Other Topics Concern  . Not on file   Social History Narrative   Original [place]         Medication List       This list is accurate as of: 05/07/14  6:29 PM.  Always use your most recent med list.               [place]  by Does not apply route. Dx 250.00     B-D LANCET DEVICE Misc  B-D Ultra-Fine 22 lancets. Test blood sugar daily. Diagnosis code: 250.00     B-D ULTRA-FINE 33 LANCETS Misc  USE AS DIRECTED     CALCIUM 1200 PO  Take 1,000 mg by mouth.     EYE VITAMINS Caps  Take 1 capsule by mouth daily.     ezetimibe-simvastatin 10-10 MG per tablet  Commonly known as:  VYTORIN  TAKE 1 TABLET BY MOUTH EVERY NIGHT AT BEDTIME     lidocaine 5 % ointment  Commonly known  as:  XYLOCAINE  Apply 1 application topically daily.     losartan-hydrochlorothiazide 100-25 MG per tablet  Commonly known as:  HYZAAR  Take 1 tablet by mouth daily.     metFORMIN 500 MG 24 hr tablet  Commonly known as:  GLUCOPHAGE XR  Take 1 tablet by mouth daily for 2 weeks, then 2 tablets by mouth daily.     metoprolol 50 MG tablet  Commonly known as:  LOPRESSOR  TAKE 1 TABLET BY MOUTH TWICE DAILY     multivitamin tablet  Take 1 tablet by mouth daily.     NEXIUM 40 MG capsule  Generic drug:  esomeprazole  TAKE ONE CAPSULE BY MOUTH EVERY DAY     ONE TOUCH ULTRA TEST test strip  Generic drug:  glucose blood  CHECK ONCE DAILY     ranitidine 150 MG capsule  Commonly known as:  ZANTAC  Take 150 mg by mouth daily as needed.     tiZANidine 4 MG tablet  Commonly known as:  ZANAFLEX  Take 0.5-1tab q6hrs prn           Objective:   Physical  Exam BP 138/80 mmHg  Pulse 63  Temp(Src) 97.8 F (36.6 C) (Oral)  Ht 5\' 3"  (1.6 m)  Wt 220 lb 8 oz (100.018 kg)  BMI 39.07 kg/m2  SpO2 96% General -- alert, well-developed, NAD.   Lungs -- normal respiratory effort, no intercostal retractions, no accessory muscle use, and normal breath sounds.  Heart-- normal rate, regular rhythm, no murmur.  DIABETIC FEET EXAM: No pitting lower extremity edema Normal pedal pulses bilaterally Skin normal, nails normal, no calluses Pinprick examination of the feet normal. Neurologic--  alert & oriented X3. Speech normal, gait appropriate for age, strength symmetric and appropriate for age.  Psych-- Cognition and judgment appear intact. Cooperative with normal attention span and concentration. No anxious or depressed appearing.        Assessment & Plan:

## 2014-05-07 NOTE — Assessment & Plan Note (Signed)
Currently on omeprazole and H2 blocker but her insurance just approve Nexium, thinks will get better once she is back on nexium

## 2014-05-07 NOTE — Progress Notes (Signed)
Pre visit review using our clinic review tool, if applicable. No additional management support is needed unless otherwise documented below in the visit note. 

## 2014-05-08 LAB — BASIC METABOLIC PANEL
BUN: 19 mg/dL (ref 6–23)
CO2: 28 meq/L (ref 19–32)
Calcium: 9.4 mg/dL (ref 8.4–10.5)
Chloride: 102 mEq/L (ref 96–112)
Creatinine, Ser: 0.74 mg/dL (ref 0.40–1.20)
GFR: 83.04 mL/min (ref >60.00)
GLUCOSE: 89 mg/dL (ref 70–99)
POTASSIUM: 3.8 meq/L (ref 3.5–5.1)
Sodium: 139 mEq/L (ref 135–145)

## 2014-05-08 LAB — HEMOGLOBIN A1C: HEMOGLOBIN A1C: 6.9 % — AB (ref 4.6–6.5)

## 2014-05-13 MED ORDER — METFORMIN HCL 1000 MG PO TABS: 1000.0000 mg | ORAL_TABLET | Freq: Two times a day (BID) | ORAL | Status: DC

## 2014-05-13 NOTE — Addendum Note (Signed)
Addended by: Janalee Dane C on: 05/13/2014 05:00 PM   Modules accepted: Orders, Medications

## 2014-07-18 ENCOUNTER — Other Ambulatory Visit: Payer: Self-pay | Admitting: Internal Medicine

## 2014-07-22 ENCOUNTER — Other Ambulatory Visit: Payer: Self-pay | Admitting: Internal Medicine

## 2014-07-24 ENCOUNTER — Other Ambulatory Visit: Payer: Self-pay

## 2014-07-28 ENCOUNTER — Other Ambulatory Visit: Payer: Self-pay | Admitting: Internal Medicine

## 2014-09-05 DIAGNOSIS — H524 Presbyopia: Secondary | ICD-10-CM | POA: Diagnosis not present

## 2014-09-05 DIAGNOSIS — H2513 Age-related nuclear cataract, bilateral: Secondary | ICD-10-CM | POA: Diagnosis not present

## 2014-09-05 DIAGNOSIS — H3531 Nonexudative age-related macular degeneration: Secondary | ICD-10-CM | POA: Diagnosis not present

## 2014-09-05 DIAGNOSIS — H04123 Dry eye syndrome of bilateral lacrimal glands: Secondary | ICD-10-CM | POA: Diagnosis not present

## 2014-09-05 LAB — HM DIABETES EYE EXAM

## 2014-10-19 ENCOUNTER — Other Ambulatory Visit: Payer: Self-pay | Admitting: Internal Medicine

## 2014-10-22 ENCOUNTER — Other Ambulatory Visit: Payer: Self-pay | Admitting: Internal Medicine

## 2014-11-07 ENCOUNTER — Ambulatory Visit (INDEPENDENT_AMBULATORY_CARE_PROVIDER_SITE_OTHER): Payer: Medicare Other | Admitting: Internal Medicine

## 2014-11-07 ENCOUNTER — Encounter: Payer: Self-pay | Admitting: Internal Medicine

## 2014-11-07 ENCOUNTER — Ambulatory Visit (HOSPITAL_BASED_OUTPATIENT_CLINIC_OR_DEPARTMENT_OTHER)
Admission: RE | Admit: 2014-11-07 | Discharge: 2014-11-07 | Disposition: A | Discharge status: Home / Self Care | Payer: Medicare Other | Source: Ambulatory Visit | Attending: Internal Medicine | Admitting: Internal Medicine

## 2014-11-07 VITALS — BP 126/74 | HR 56 | Temp 97.7°F | Ht 63.0 in | Wt 216.5 lb

## 2014-11-07 DIAGNOSIS — I10 Essential (primary) hypertension: Secondary | ICD-10-CM | POA: Diagnosis not present

## 2014-11-07 DIAGNOSIS — E119 Type 2 diabetes mellitus without complications: Secondary | ICD-10-CM

## 2014-11-07 DIAGNOSIS — E785 Hyperlipidemia, unspecified: Secondary | ICD-10-CM

## 2014-11-07 DIAGNOSIS — R519 Headache, unspecified: Secondary | ICD-10-CM

## 2014-11-07 DIAGNOSIS — R51 Headache: Secondary | ICD-10-CM

## 2014-11-07 LAB — AST: AST: 16 U/L (ref 0–37)

## 2014-11-07 LAB — ALT: ALT: 13 U/L (ref 0–35)

## 2014-11-07 LAB — LIPID PANEL
Cholesterol: 144 mg/dL (ref 0–200)
HDL: 55 mg/dL (ref >39.00)
LDL Cholesterol: 75 mg/dL (ref 0–99)
NONHDL: 89
TRIGLYCERIDES: 71 mg/dL (ref 0.0–149.0)
Total CHOL/HDL Ratio: 3
VLDL: 14.2 mg/dL (ref 0.0–40.0)

## 2014-11-07 LAB — BASIC METABOLIC PANEL
BUN: 14 mg/dL (ref 6–23)
CO2: 32 meq/L (ref 19–32)
Calcium: 9.4 mg/dL (ref 8.4–10.5)
Chloride: 100 mEq/L (ref 96–112)
Creatinine, Ser: 0.67 mg/dL (ref 0.40–1.20)
GFR: 92.99 mL/min (ref >60.00)
Glucose, Bld: 104 mg/dL — ABNORMAL HIGH (ref 70–99)
POTASSIUM: 3.6 meq/L (ref 3.5–5.1)
SODIUM: 138 meq/L (ref 135–145)

## 2014-11-07 LAB — HM DIABETES FOOT EXAM

## 2014-11-07 LAB — HEMOGLOBIN A1C: Hgb A1c MFr Bld: 6.3 % (ref 4.6–6.5)

## 2014-11-07 MED ORDER — METOPROLOL TARTRATE 50 MG PO TABS: 50.0000 mg | ORAL_TABLET | Freq: Two times a day (BID) | ORAL | Status: DC

## 2014-11-07 MED ORDER — LOSARTAN POTASSIUM-HCTZ 100-25 MG PO TABS: 1.0000 | ORAL_TABLET | Freq: Every day | ORAL | Status: DC

## 2014-11-07 MED ORDER — CYCLOBENZAPRINE HCL 10 MG PO TABS: 10.0000 mg | ORAL_TABLET | Freq: Every day | ORAL | Status: DC

## 2014-11-07 MED ORDER — EZETIMIBE-SIMVASTATIN 10-10 MG PO TABS: 1.0000 | ORAL_TABLET | Freq: Every day | ORAL | Status: DC

## 2014-11-07 MED ORDER — METFORMIN HCL 1000 MG PO TABS: 1000.0000 mg | ORAL_TABLET | Freq: Every day | ORAL | Status: DC

## 2014-11-07 NOTE — Assessment & Plan Note (Signed)
Continue with current medications, check a BMP. BP seems to be well-controlled

## 2014-11-07 NOTE — Patient Instructions (Addendum)
Get your blood work before you leave   We are ordering a CT of the head  For the next month,  take Flexeril at bedtime to prevent headaches  If her headaches are more intense or frequent please call us

## 2014-11-07 NOTE — Progress Notes (Signed)
Subjective:    Patient ID: Casey Bryan, female    DOB: Nov 09, 1945, 69 y.o.   MRN: [id number]  DOS:  11/07/2014 Type of visit - description : Routine office visit Interval history: Diabetes, she self decreased metformin to once daily because blood sugars have been in the 100, 123. Also she reports headaches on and off for the last month, approximately one episode / week. She wakes because of the headache in the middle of the night, pain described as intense but not the worst of her life. Located at the occipital area bilaterally, no radiation. It goes away spontaneously. Hypertension, good compliance of medication, due for a BMP High cholesterol, on medications. No apparent side effects   Review of Systems Denies nausea vomiting or recent head injury No dizziness, diplopia, sore speech, motor deficits or facial numbness.   Past Medical History  Diagnosis Date  . Hyperlipemia   . Hypertension   . GERD (gastroesophageal reflux disease)   . Diabetes mellitus   . Glaucoma 04-2012    Past Surgical History  Procedure Laterality Date  . No past surgeries      History   Social History  . Marital Status: Married    Spouse Name: N/A  . Number of Children: 3  . Years of Education: N/A   Occupational History  . semi-retired,own a busines, Optometrist   .     Social History Main Topics  . Smoking status: Never Smoker   . Smokeless tobacco: Never Used  . Alcohol Use: Yes     Comment: socially  . Drug Use: No  . Sexual Activity: Not on file   Other Topics Concern  . Not on file   Social History Narrative   Original [place]         Medication List       This list is accurate as of: 11/07/14 11:59 PM.  Always use your most recent med list.               [place]  by Does not apply route. Dx 250.00     B-D LANCET DEVICE Misc  B-D Ultra-Fine 22 lancets. Test blood sugar daily. Diagnosis code: 250.00     B-D ULTRA-FINE 33 LANCETS Misc  USE AS  DIRECTED     CALCIUM 1200 PO  Take 1,000 mg by mouth.     cyclobenzaprine 10 MG tablet  Commonly known as:  FLEXERIL  Take 1 tablet (10 mg total) by mouth at bedtime.     EYE VITAMINS Caps  Take 1 capsule by mouth daily.     ezetimibe-simvastatin 10-10 MG per tablet  Commonly known as:  VYTORIN  Take 1 tablet by mouth at bedtime.     lidocaine 5 % ointment  Commonly known as:  XYLOCAINE  Apply 1 application topically 2 (two) times daily as needed.     losartan-hydrochlorothiazide 100-25 MG per tablet  Commonly known as:  HYZAAR  Take 1 tablet by mouth daily.     metFORMIN 1000 MG tablet  Commonly known as:  GLUCOPHAGE  Take 1 tablet (1,000 mg total) by mouth daily with breakfast.     metoprolol 50 MG tablet  Commonly known as:  LOPRESSOR  Take 1 tablet (50 mg total) by mouth 2 (two) times daily.     multivitamin tablet  Take 1 tablet by mouth daily.     NEXIUM 40 MG capsule  Generic drug:  esomeprazole  Take 1 capsule (40 mg total)  by mouth daily.     ONE TOUCH ULTRA TEST test strip  Generic drug:  glucose blood  CHECK ONCE DAILY     ranitidine 150 MG capsule  Commonly known as:  ZANTAC  Take 150 mg by mouth daily as needed.           Objective:   Physical Exam BP 126/74 mmHg  Pulse 56  Temp(Src) 97.7 F (36.5 C) (Oral)  Ht 5\' 3"  (1.6 m)  Wt 216 lb 8 oz (98.204 kg)  BMI 38.36 kg/m2  SpO2 98% General:   Well developed, well nourished . NAD.  HEENT:  Normocephalic . Face symmetric, atraumatic Neck: No TTP at the cervical spine, range of motion is normal. Lungs:  CTA B Normal respiratory effort, no intercostal retractions, no accessory muscle use. Heart: RRR,  no murmur.  No pretibial edema bilaterally  Skin: Not pale. Not jaundice Neurologic:  alert & oriented X3.  Speech normal, gait appropriate for age and unassisted DTRs symmetric, strength symmetric. EOMI  diabetic foot exam: No edema, ; pedal pulses ,skin, pinprick examination -->  normal Psych--  Cognition and judgment appear intact.  Cooperative with normal attention span and concentration.  Behavior appropriate. No anxious or depressed appearing.      Assessment & Plan:

## 2014-11-07 NOTE — Assessment & Plan Note (Signed)
Last year had neck pain on the occipital area with some radiation to r arm, saw neurology, question of radiculopathy. At this point she has a new onset of occipital headache, nocturnal, no radiation, neurological exam is normal. She admits to some anxiety. DDX includes a ICB, will get a CT of the head. If negative will treat as tensional HA w/ Flexeril.

## 2014-11-07 NOTE — Assessment & Plan Note (Addendum)
A1c 6.9 after she started metformin. Patient self decreased metformin to once daily. Plan: Continue with metformin once daily, check a A1c but consider go back up to 2 tablets a day depending on results.

## 2014-11-07 NOTE — Progress Notes (Signed)
Pre visit review using our clinic review tool, if applicable. No additional management support is needed unless otherwise documented below in the visit note. 

## 2015-02-18 ENCOUNTER — Encounter: Payer: Medicare Other | Admitting: Internal Medicine

## 2015-02-21 ENCOUNTER — Telehealth: Payer: Self-pay | Admitting: Internal Medicine

## 2015-02-21 NOTE — Telephone Encounter (Signed)
Pt was no show 02/18/15 3:00pm for cpe appt, called cell # which is for daughter bc the home # we had was disconnected, daughter said that pt cancelled appt, no notes documenting a call for cancellation, updated pt phone # as well, did not reschedule, charge or no charge?

## 2015-02-21 NOTE — Telephone Encounter (Signed)
No charge. 

## 2015-03-24 DIAGNOSIS — G43819 Other migraine, intractable, without status migrainosus: Secondary | ICD-10-CM | POA: Diagnosis not present

## 2015-03-25 ENCOUNTER — Ambulatory Visit (INDEPENDENT_AMBULATORY_CARE_PROVIDER_SITE_OTHER): Payer: Medicare Other | Admitting: Internal Medicine

## 2015-03-25 ENCOUNTER — Encounter: Payer: Self-pay | Admitting: Internal Medicine

## 2015-03-25 VITALS — BP 124/68 | HR 59 | Temp 97.8°F | Ht 63.0 in | Wt 206.5 lb

## 2015-03-25 DIAGNOSIS — G453 Amaurosis fugax: Secondary | ICD-10-CM

## 2015-03-25 DIAGNOSIS — E119 Type 2 diabetes mellitus without complications: Secondary | ICD-10-CM | POA: Diagnosis not present

## 2015-03-25 DIAGNOSIS — Z09 Encounter for follow-up examination after completed treatment for conditions other than malignant neoplasm: Secondary | ICD-10-CM | POA: Insufficient documentation

## 2015-03-25 DIAGNOSIS — Z1159 Encounter for screening for other viral diseases: Secondary | ICD-10-CM

## 2015-03-25 DIAGNOSIS — I10 Essential (primary) hypertension: Secondary | ICD-10-CM | POA: Diagnosis not present

## 2015-03-25 LAB — BASIC METABOLIC PANEL
BUN: 15 mg/dL (ref 6–23)
CALCIUM: 9.3 mg/dL (ref 8.4–10.5)
CO2: 32 mEq/L (ref 19–32)
Chloride: 102 mEq/L (ref 96–112)
Creatinine, Ser: 0.68 mg/dL (ref 0.40–1.20)
GFR: 91.31 mL/min (ref >60.00)
GLUCOSE: 107 mg/dL — AB (ref 70–99)
Potassium: 3.6 mEq/L (ref 3.5–5.1)
SODIUM: 141 meq/L (ref 135–145)

## 2015-03-25 LAB — SEDIMENTATION RATE: Sed Rate: 27 mm/hr — ABNORMAL HIGH (ref 0–22)

## 2015-03-25 LAB — HEMOGLOBIN A1C: Hgb A1c MFr Bld: 6.4 % (ref 4.6–6.5)

## 2015-03-25 NOTE — Assessment & Plan Note (Signed)
Amaurosis fugax: Seen by ophthalmology yesterday, apparently no primary ocular dz. Plan: EKG (sinus brady, at baseline), carotid ultrasound, echo, sed rate .  If workup negative, continue controlling cardiovascular risk factors. Start aspirin 81 mg HTN: BP slightly elevated yesterday, very good today. No change, continue monitoring, check a BMP DM: Check A1c RTC 4 weeks Primary care: Flu shot declined

## 2015-03-25 NOTE — Progress Notes (Signed)
Subjective:    Patient ID: Casey Bryan, female    DOB: 1945-11-09, 69 y.o.   MRN: [id number]  DOS:  03/25/2015 Type of visit - description : Acute visit Interval history: Yesterday morning, she  had a loss of vision of the left eye, lasted 30-40 minutes, gradually improved. Was acutely seen by opht.  Dr. Bing Plume office,   was recommended to come to this office, they think she may had pre- stroke versus a migraine. Apparently there was no retinal bleeding or primary eye problem. Good compliance with medications, BP yesterday was 179/86, 157/85. This morning 169/89. Patient is back to normal today.   Review of Systems No chest pain, difficulty breathing or palpitations History of headaches, she still gets headaches sometimes No dizziness, diplopia, slurred speech or motor deficits.    Past Medical History  Diagnosis Date  . Hyperlipemia   . Hypertension   . GERD (gastroesophageal reflux disease)   . Diabetes mellitus   . Glaucoma 04-2012    Past Surgical History  Procedure Laterality Date  . No past surgeries      Social History   Social History  . Marital Status: Married    Spouse Name: N/A  . Number of Children: 3  . Years of Education: N/A   Occupational History  . semi-retired,own a busines, Optometrist   .     Social History Main Topics  . Smoking status: Never Smoker   . Smokeless tobacco: Never Used  . Alcohol Use: Yes     Comment: socially  . Drug Use: No  . Sexual Activity: Not on file   Other Topics Concern  . Not on file   Social History Narrative   Original [place]         Medication List       This list is accurate as of: 03/25/15  5:57 PM.  Always use your most recent med list.               [place]  by Does not apply route. Dx 250.00     aspirin EC 81 MG tablet  Take 81 mg by mouth daily.     B-D LANCET DEVICE Misc  B-D Ultra-Fine 22 lancets. Test blood sugar daily. Diagnosis code: 250.00     B-D ULTRA-FINE 33  LANCETS Misc  USE AS DIRECTED     CALCIUM 1200 PO  Take 1,000 mg by mouth.     EYE VITAMINS Caps  Take 1 capsule by mouth daily.     ezetimibe-simvastatin 10-10 MG tablet  Commonly known as:  VYTORIN  Take 1 tablet by mouth at bedtime.     lidocaine 5 % ointment  Commonly known as:  XYLOCAINE  Apply 1 application topically 2 (two) times daily as needed.     losartan-hydrochlorothiazide 100-25 MG tablet  Commonly known as:  HYZAAR  Take 1 tablet by mouth daily.     metFORMIN 1000 MG tablet  Commonly known as:  GLUCOPHAGE  Take 1 tablet (1,000 mg total) by mouth daily with breakfast.     metoprolol 50 MG tablet  Commonly known as:  LOPRESSOR  Take 1 tablet (50 mg total) by mouth 2 (two) times daily.     multivitamin tablet  Take 1 tablet by mouth daily.     NEXIUM 40 MG capsule  Generic drug:  esomeprazole  Take 1 capsule (40 mg total) by mouth daily.     ONE TOUCH ULTRA TEST test strip  Generic drug:  glucose blood  CHECK ONCE DAILY     ranitidine 150 MG capsule  Commonly known as:  ZANTAC  Take 150 mg by mouth daily as needed.           Objective:   Physical Exam BP 124/68 mmHg  Pulse 59  Temp(Src) 97.8 F (36.6 C) (Oral)  Ht 5\' 3"  (1.6 m)  Wt 206 lb 8 oz (93.668 kg)  BMI 36.59 kg/m2  SpO2 99% General:   Well developed, well nourished . NAD.  HEENT:  Normocephalic . Face symmetric, atraumatic Neck: Normal carotid pulses without bruit Lungs:  CTA B Normal respiratory effort, no intercostal retractions, no accessory muscle use. Heart: RRR,  no murmur.  No pretibial edema bilaterally  Skin: Not pale. Not jaundice Neurologic:  alert & oriented X3.  Speech normal, gait appropriate for age and unassisted. Face symmetric, EOMI, pupils symmetric, hyporeactive, DTRs symmetric. Psych--  Cognition and judgment appear intact.  Cooperative with normal attention span and concentration.  Behavior appropriate. No anxious or depressed appearing.        Assessment & Plan:   Assessment: DM HTN Hyperlipidemia Hyperlipidemia Glaucoma dx 2014, cataracts Headaches: Saw neurology 2015 L amaurosix fugax, saw opthalmology, started ASA  (02-2015)   Plan: Amaurosis fugax: Seen by ophthalmology yesterday, apparently no primary ocular dz. Plan: EKG (sinus brady, at baseline), carotid ultrasound, echo, sed rate .  If workup negative, continue controlling cardiovascular risk factors. Start aspirin 81 mg HTN: BP slightly elevated yesterday, very good today. No change, continue monitoring, check a BMP DM: Check A1c RTC 4 weeks Primary care: Flu shot declined

## 2015-03-25 NOTE — Progress Notes (Signed)
Pre visit review using our clinic review tool, if applicable. No additional management support is needed unless otherwise documented below in the visit note. 

## 2015-03-25 NOTE — Patient Instructions (Signed)
Get your blood work before you leave   We'll get an echocardiogram We'll get a carotid ultrasound  Start aspirin 81 mg everyday  ER or call if symptoms resurface     Check the  blood pressure daily  Be sure your blood pressure is between 110/65 and  145/85.  if it is consistently higher or lower, let me know   Next visit  for a cup in 4 weeks, nonfasting    (15 minutes) Please schedule an appointment at the front desk

## 2015-03-26 LAB — HEPATITIS C ANTIBODY: HCV AB: NEGATIVE

## 2015-03-27 ENCOUNTER — Ambulatory Visit: Payer: Medicare Other | Admitting: Internal Medicine

## 2015-04-02 ENCOUNTER — Telehealth: Payer: Self-pay | Admitting: Internal Medicine

## 2015-04-02 NOTE — Telephone Encounter (Signed)
Error

## 2015-04-07 ENCOUNTER — Other Ambulatory Visit: Payer: Self-pay | Admitting: Internal Medicine

## 2015-04-07 DIAGNOSIS — G453 Amaurosis fugax: Secondary | ICD-10-CM

## 2015-04-09 ENCOUNTER — Ambulatory Visit (HOSPITAL_COMMUNITY): Payer: Medicare Other | Attending: Cardiology

## 2015-04-09 DIAGNOSIS — E119 Type 2 diabetes mellitus without complications: Secondary | ICD-10-CM | POA: Insufficient documentation

## 2015-04-09 DIAGNOSIS — E785 Hyperlipidemia, unspecified: Secondary | ICD-10-CM | POA: Insufficient documentation

## 2015-04-09 DIAGNOSIS — I10 Essential (primary) hypertension: Secondary | ICD-10-CM | POA: Diagnosis not present

## 2015-04-09 DIAGNOSIS — G453 Amaurosis fugax: Secondary | ICD-10-CM | POA: Diagnosis not present

## 2015-04-10 ENCOUNTER — Encounter: Payer: Self-pay | Admitting: Internal Medicine

## 2015-04-11 ENCOUNTER — Other Ambulatory Visit: Payer: Self-pay | Admitting: Internal Medicine

## 2015-04-11 ENCOUNTER — Ambulatory Visit (HOSPITAL_COMMUNITY)
Admission: RE | Admit: 2015-04-11 | Discharge: 2015-04-11 | Disposition: A | Discharge status: Home / Self Care | Payer: Medicare Other | Source: Ambulatory Visit | Attending: Cardiovascular Disease | Admitting: Cardiovascular Disease

## 2015-04-11 DIAGNOSIS — I6523 Occlusion and stenosis of bilateral carotid arteries: Secondary | ICD-10-CM | POA: Insufficient documentation

## 2015-04-11 DIAGNOSIS — G453 Amaurosis fugax: Secondary | ICD-10-CM

## 2015-04-11 DIAGNOSIS — E785 Hyperlipidemia, unspecified: Secondary | ICD-10-CM | POA: Diagnosis not present

## 2015-04-11 DIAGNOSIS — I10 Essential (primary) hypertension: Secondary | ICD-10-CM | POA: Diagnosis not present

## 2015-04-11 DIAGNOSIS — E119 Type 2 diabetes mellitus without complications: Secondary | ICD-10-CM | POA: Diagnosis not present

## 2015-04-22 ENCOUNTER — Ambulatory Visit (INDEPENDENT_AMBULATORY_CARE_PROVIDER_SITE_OTHER): Payer: Medicare Other | Admitting: Internal Medicine

## 2015-04-22 ENCOUNTER — Encounter: Payer: Self-pay | Admitting: Internal Medicine

## 2015-04-22 VITALS — BP 122/70 | HR 61 | Temp 97.7°F | Ht 63.0 in | Wt 207.0 lb

## 2015-04-22 DIAGNOSIS — I10 Essential (primary) hypertension: Secondary | ICD-10-CM | POA: Diagnosis not present

## 2015-04-22 DIAGNOSIS — Z23 Encounter for immunization: Secondary | ICD-10-CM | POA: Diagnosis not present

## 2015-04-22 DIAGNOSIS — G453 Amaurosis fugax: Secondary | ICD-10-CM | POA: Diagnosis not present

## 2015-04-22 MED ORDER — EZETIMIBE-SIMVASTATIN 10-10 MG PO TABS: 1.0000 | ORAL_TABLET | Freq: Every day | ORAL | Status: DC

## 2015-04-22 MED ORDER — NEXIUM 40 MG PO CPDR: 40.0000 mg | DELAYED_RELEASE_CAPSULE | Freq: Every day | ORAL | Status: DC

## 2015-04-22 MED ORDER — METFORMIN HCL 1000 MG PO TABS: 1000.0000 mg | ORAL_TABLET | Freq: Every day | ORAL | Status: DC

## 2015-04-22 MED ORDER — METOPROLOL TARTRATE 50 MG PO TABS: 75.0000 mg | ORAL_TABLET | Freq: Two times a day (BID) | ORAL | Status: DC

## 2015-04-22 MED ORDER — LOSARTAN POTASSIUM-HCTZ 100-25 MG PO TABS: 1.0000 | ORAL_TABLET | Freq: Every day | ORAL | Status: DC

## 2015-04-22 NOTE — Progress Notes (Signed)
Subjective:    Patient ID: Casey Bryan, female    DOB: [birth year], 70 y.o.   MRN: [id number]  DOS:  04/22/2015 Type of visit - description : Follow-up from previous visit Since the last time she was here, had no further episodes of amaurosis fugax. Good compliance with medications. Ambulatory BPs 140-160/80s- 90s.  Review of Systems  Denies chest pain or difficulty breathing No headache, dizziness, diplopia. She has experienced however whole right sided numbness, not associated with weakness. Usually in the mornings, usually better as the day goes by.  Past Medical History  Diagnosis Date  . Hyperlipemia   . Hypertension   . GERD (gastroesophageal reflux disease)   . Diabetes mellitus   . Glaucoma 04-2012    Past Surgical History  Procedure Laterality Date  . No past surgeries      Social History   Social History  . Marital Status: Married    Spouse Name: N/A  . Number of Children: 3  . Years of Education: N/A   Occupational History  . semi-retired,own a busines, Optometrist   .     Social History Main Topics  . Smoking status: Never Smoker   . Smokeless tobacco: Never Used  . Alcohol Use: Yes     Comment: socially  . Drug Use: No  . Sexual Activity: Not on file   Other Topics Concern  . Not on file   Social History Narrative   Original [place]         Medication List       This list is accurate as of: 04/22/15  6:24 PM.  Always use your most recent med list.               [place]  by Does not apply route. Reported on 04/22/2015     aspirin EC 81 MG tablet  Take 81 mg by mouth daily.     B-D LANCET DEVICE Misc  B-D Ultra-Fine 22 lancets. Test blood sugar daily. Diagnosis code: 250.00     B-D ULTRA-FINE 33 LANCETS Misc  USE AS DIRECTED     CALCIUM 1200 PO  Take 1,000 mg by mouth.     EYE VITAMINS Caps  Take 1 capsule by mouth daily.     ezetimibe-simvastatin 10-10 MG tablet  Commonly known as:  VYTORIN  Take 1 tablet  by mouth at bedtime.     lidocaine 5 % ointment  Commonly known as:  XYLOCAINE  Apply 1 application topically 2 (two) times daily as needed.     losartan-hydrochlorothiazide 100-25 MG tablet  Commonly known as:  HYZAAR  Take 1 tablet by mouth daily.     metFORMIN 1000 MG tablet  Commonly known as:  GLUCOPHAGE  Take 1 tablet (1,000 mg total) by mouth daily with breakfast.     metoprolol 50 MG tablet  Commonly known as:  LOPRESSOR  Take 1.5 tablets (75 mg total) by mouth 2 (two) times daily.     multivitamin tablet  Take 1 tablet by mouth daily.     NEXIUM 40 MG capsule  Generic drug:  esomeprazole  Take 1 capsule (40 mg total) by mouth daily.     ONE TOUCH ULTRA TEST test strip  Generic drug:  glucose blood  CHECK ONCE DAILY     ranitidine 150 MG capsule  Commonly known as:  ZANTAC  Take 150 mg by mouth daily as needed.           Objective:  Physical Exam BP 122/70 mmHg  Pulse 61  Temp(Src) 97.7 F (36.5 C) (Oral)  Ht 5\' 3"  (1.6 m)  Wt 207 lb (93.895 kg)  BMI 36.68 kg/m2  SpO2 98% General:   Well developed, well nourished . NAD.  HEENT:  Normocephalic . Face symmetric, atraumatic Lungs:  CTA B Normal respiratory effort, no intercostal retractions, no accessory muscle use. Heart: RRR,  no murmur.  No pretibial edema bilaterally  Skin: Not pale. Not jaundice Neurologic:  alert & oriented X3.  Speech normal, gait appropriate for age and unassisted. EOMI, pupils equal and reactive, motor intact Psych--  Cognition and judgment appear intact.  Cooperative with normal attention span and concentration.  Behavior appropriate. No anxious or depressed appearing.      Assessment & Plan:    Assessment: DM HTN Hyperlipidemia Hyperlipidemia Glaucoma dx 2014, cataracts Headaches: Saw neurology 2015 L amaurosix fugax, saw opthalmology, started ASA  (02-2015); echo 04-09-15: nl EF, grade 2 diastolic disfx   Plan: Left amaurosis fugax: No further symptoms  however she is having right-sided numbness which concerns me. Cardiovascular risk factors well controlled except for elevated BP. Plan: Get carotid ultrasound report, MRI/MRA brain, neuro referral, work on BP control. ER if symptoms increase, severe or more concerning for a stroke. HTN: Ambulatory BPs usually high but is always normal at the office. Increase metoprolol to 75 mg twice a day. Primary care: Flu shot today

## 2015-04-22 NOTE — Progress Notes (Signed)
Pre visit review using our clinic review tool, if applicable. No additional management support is needed unless otherwise documented below in the visit note. 

## 2015-04-22 NOTE — Patient Instructions (Addendum)
BEFORE YOU LEAVE THE OFFICE: GO TO THE FRONT DESK  Schedule a routine office visit or check up to be done in  3 months  Please be fasting  We can also see you in the afternoon and check labs the next day Front desk:    [place]:  Increase metoprolol 50- to 1.5 tablets twice a day   Check the  blood pressure 2 or 3 times a   Week  Be sure your blood pressure is between 110/65 and  145/85. If it is consistently higher or lower, let me know

## 2015-04-26 ENCOUNTER — Ambulatory Visit (HOSPITAL_BASED_OUTPATIENT_CLINIC_OR_DEPARTMENT_OTHER): Admission: RE | Admit: 2015-04-26 | Payer: Medicare Other | Source: Ambulatory Visit

## 2015-05-04 ENCOUNTER — Ambulatory Visit (HOSPITAL_BASED_OUTPATIENT_CLINIC_OR_DEPARTMENT_OTHER)
Admission: RE | Admit: 2015-05-04 | Discharge: 2015-05-04 | Disposition: A | Discharge status: Home / Self Care | Payer: Medicare Other | Source: Ambulatory Visit | Attending: Internal Medicine | Admitting: Internal Medicine

## 2015-05-04 DIAGNOSIS — G453 Amaurosis fugax: Secondary | ICD-10-CM | POA: Diagnosis not present

## 2015-05-04 DIAGNOSIS — R200 Anesthesia of skin: Secondary | ICD-10-CM | POA: Diagnosis not present

## 2015-05-04 DIAGNOSIS — I6782 Cerebral ischemia: Secondary | ICD-10-CM | POA: Diagnosis not present

## 2015-05-08 ENCOUNTER — Other Ambulatory Visit: Payer: Self-pay | Admitting: Internal Medicine

## 2015-05-13 ENCOUNTER — Encounter: Payer: Self-pay | Admitting: Neurology

## 2015-05-13 ENCOUNTER — Ambulatory Visit (INDEPENDENT_AMBULATORY_CARE_PROVIDER_SITE_OTHER): Payer: Medicare Other | Admitting: Neurology

## 2015-05-13 VITALS — BP 138/68 | HR 60 | Ht 63.0 in | Wt 207.0 lb

## 2015-05-13 DIAGNOSIS — E119 Type 2 diabetes mellitus without complications: Secondary | ICD-10-CM

## 2015-05-13 DIAGNOSIS — I10 Essential (primary) hypertension: Secondary | ICD-10-CM

## 2015-05-13 DIAGNOSIS — I6521 Occlusion and stenosis of right carotid artery: Secondary | ICD-10-CM | POA: Diagnosis not present

## 2015-05-13 DIAGNOSIS — G453 Amaurosis fugax: Secondary | ICD-10-CM

## 2015-05-13 DIAGNOSIS — I639 Cerebral infarction, unspecified: Secondary | ICD-10-CM

## 2015-05-13 DIAGNOSIS — Z794 Long term (current) use of insulin: Secondary | ICD-10-CM

## 2015-05-13 DIAGNOSIS — I6529 Occlusion and stenosis of unspecified carotid artery: Secondary | ICD-10-CM | POA: Insufficient documentation

## 2015-05-13 DIAGNOSIS — E785 Hyperlipidemia, unspecified: Secondary | ICD-10-CM | POA: Diagnosis not present

## 2015-05-13 NOTE — Patient Instructions (Signed)
1.  Continue aspirin 81mg  daily 2.  I recommend that your cholesterol medication is adjusted/changed in order to lower the LDL (goal should be less than 70) 3.  Go for routine walks 4.  Discuss with Dr. Larose Kells regarding blood pressure control. 5.  Mediterranean diet    Why follow it? Research shows. . Those who follow the Mediterranean diet have a reduced risk of heart disease  . The diet is associated with a reduced incidence of Parkinson's and Alzheimer's diseases . People following the diet may have longer life expectancies and lower rates of chronic diseases  . The Dietary Guidelines for Americans recommends the Mediterranean diet as an eating plan to promote health and prevent disease  What Is the Mediterranean Diet?  . Healthy eating plan based on typical foods and recipes of Mediterranean-style cooking . The diet is primarily a plant based diet; these foods should make up a majority of meals   Starches - Plant based foods should make up a majority of meals - They are an important sources of vitamins, minerals, energy, antioxidants, and fiber - Choose whole grains, foods high in fiber and minimally processed items  - Typical grain sources include wheat, oats, barley, corn, brown rice, bulgar, farro, millet, polenta, couscous  - Various types of beans include chickpeas, lentils, fava beans, black beans, white beans   Fruits  Veggies - Large quantities of antioxidant rich fruits & veggies; 6 or more servings  - Vegetables can be eaten raw or lightly drizzled with oil and cooked  - Vegetables common to the traditional Mediterranean Diet include: artichokes, arugula, beets, broccoli, brussel sprouts, cabbage, carrots, celery, collard greens, cucumbers, eggplant, kale, leeks, lemons, lettuce, mushrooms, okra, onions, peas, peppers, potatoes, pumpkin, radishes, rutabaga, shallots, spinach, sweet potatoes, turnips, zucchini - Fruits common to the Mediterranean Diet include: apples, apricots,  avocados, cherries, clementines, dates, figs, grapefruits, grapes, melons, nectarines, oranges, peaches, pears, pomegranates, strawberries, tangerines  Fats - Replace butter and margarine with healthy oils, such as olive oil, canola oil, and tahini  - Limit nuts to no more than a handful a day  - Nuts include walnuts, almonds, pecans, pistachios, pine nuts  - Limit or avoid candied, honey roasted or heavily salted nuts - Olives are central to the [employer] - can be eaten whole or used in a variety of dishes   Meats Protein - Limiting red meat: no more than a few times a month - When eating red meat: choose lean cuts and keep the portion to the size of deck of cards - Eggs: approx. 0 to 4 times a week  - Fish and lean poultry: at least 2 a week  - Healthy protein sources include, chicken, [place], lean beef, lamb - Increase intake of seafood such as tuna, salmon, trout, mackerel, shrimp, scallops - Avoid or limit high fat processed meats such as sausage and bacon  Dairy - Include moderate amounts of low fat dairy products  - Focus on healthy dairy such as fat free yogurt, skim milk, low or reduced fat cheese - Limit dairy products higher in fat such as whole or 2% milk, cheese, ice cream  Alcohol - Moderate amounts of red wine is ok  - No more than 5 oz daily for women (all ages) and men older than age 72  - No more than 10 oz of wine daily for men younger than 23  Other - Limit sweets and other desserts  - Use herbs and spices instead of salt  to flavor foods  - Herbs and spices common to the traditional Mediterranean Diet include: basil, bay leaves, chives, cloves, cumin, fennel, garlic, lavender, marjoram, mint, oregano, parsley, pepper, rosemary, sage, savory, sumac, tarragon, thyme   It's not just a diet, it's a lifestyle:  . The Mediterranean diet includes lifestyle factors typical of those in the region  . Foods, drinks and meals are best eaten with others and savored . Daily  physical activity is important for overall good health . This could be strenuous exercise like running and aerobics . This could also be more leisurely activities such as walking, housework, yard-work, or taking the stairs . Moderation is the key; a balanced and healthy diet accommodates most foods and drinks . Consider portion sizes and frequency of consumption of certain foods   Meal Ideas & Options:  . Breakfast:  o Whole wheat toast or whole wheat English muffins with peanut butter & hard boiled egg o Steel cut oats topped with apples & cinnamon and skim milk  o Fresh fruit: banana, strawberries, melon, berries, peaches  o Smoothies: strawberries, bananas, greek yogurt, peanut butter o Low fat greek yogurt with blueberries and granola  o Egg white omelet with spinach and mushrooms o Breakfast couscous: whole wheat couscous, apricots, skim milk, cranberries  . Sandwiches:  o Hummus and grilled vegetables (peppers, zucchini, squash) on whole wheat bread   o Grilled chicken on whole wheat pita with lettuce, tomatoes, cucumbers or tzatziki  o Tuna salad on whole wheat bread: tuna salad made with greek yogurt, olives, red peppers, capers, green onions o Garlic rosemary lamb pita: lamb sauted with garlic, rosemary, salt & pepper; add lettuce, cucumber, greek yogurt to pita - flavor with lemon juice and black pepper  . Seafood:  o Mediterranean grilled salmon, seasoned with garlic, basil, parsley, lemon juice and black pepper o Shrimp, lemon, and spinach whole-grain pasta salad made with low fat greek yogurt  o Seared scallops with lemon orzo  o Seared tuna steaks seasoned salt, pepper, coriander topped with tomato mixture of olives, tomatoes, olive oil, minced garlic, parsley, green onions and cappers  . Meats:  o Herbed greek chicken salad with kalamata olives, cucumber, feta  o Red bell peppers stuffed with spinach, bulgur, lean ground beef (or lentils) & topped with feta   o Kebabs:  skewers of chicken, tomatoes, onions, zucchini, squash  o [place] burgers: made with red onions, mint, dill, lemon juice, feta cheese topped with roasted red peppers . Vegetarian o Cucumber salad: cucumbers, artichoke hearts, celery, red onion, feta cheese, tossed in olive oil & lemon juice  o Hummus and whole grain pita points with a greek salad (lettuce, tomato, feta, olives, cucumbers, red onion) o Lentil soup with celery, carrots made with vegetable broth, garlic, salt and pepper  o Tabouli salad: parsley, bulgur, mint, scallions, cucumbers, tomato, radishes, lemon juice, olive oil, salt and pepper. 6.  I want you to follow up in 3 months and have the cholesterol rechecked prior to follow up.

## 2015-05-13 NOTE — Progress Notes (Signed)
NEUROLOGY FOLLOW UP OFFICE NOTE  DELEON SPRENKLE [id number]  HISTORY OF PRESENT ILLNESS: Casey Bryan is a 70 year old right-handed woman with hypertension, type II diabetes, GERD, glaucoma, and vitamin D deficiency whom I previously saw for neck pain, now presents for amaurosis fugax.  History obtained by patient, her daughter and PCP note.  Labs, doppler reports and imaging of brain MRI/MRA reviewed.  On 03/24/15, she had loss of vision in the left eye, lasting 20 to 30 minutes and then gradually improved.  She saw ophthalmology who suspected stroke or migraine.  She started ASA 81mg  daily.  Since then, she notes numbness and tingling off and on involving the right side of her body (face, arm and leg).  She denies weakness or recurrent visual disturbance.  2D echo showed normal EF of 60%-65% with grade 2 diastolic dysfunction.  Carotid doppler showed 40-59% right ICA stenosis, but 1-39% left ICA stenosis.  Sed Rate was 27.  Hgb A1c was 6.4%.  She had an MRI and MRA of the head performed on 05/04/15, which showed mild small vessel ischemic changes but no acute/subacute infarct or high-grade stenosis or occlusion of the intracranial vessels.  LDL from July was 75.  She has been on Vytorin for several years.  She reports that her blood pressure is still a little high in the mornings (this morning it was about 150/85).  PAST MEDICAL HISTORY: Past Medical History  Diagnosis Date  . Hyperlipemia   . Hypertension   . GERD (gastroesophageal reflux disease)   . Diabetes mellitus   . Glaucoma 04-2012    MEDICATIONS: Current Outpatient Prescriptions on File Prior to Visit  Medication Sig Dispense Refill  . aspirin EC 81 MG tablet Take 81 mg by mouth daily.    . Calcium Carbonate-Vit D-Min (CALCIUM 1200 PO) Take 1,000 mg by mouth.     . ezetimibe-simvastatin (VYTORIN) 10-10 MG tablet Take 1 tablet by mouth at bedtime. 90 tablet 1  . losartan-hydrochlorothiazide (HYZAAR) 100-25 MG tablet Take 1 tablet by  mouth daily. 90 tablet 1  . metFORMIN (GLUCOPHAGE) 1000 MG tablet Take 1 tablet (1,000 mg total) by mouth daily with breakfast. 90 tablet 1  . metoprolol (LOPRESSOR) 50 MG tablet Take 1.5 tablets (75 mg total) by mouth 2 (two) times daily. 270 tablet 1  . Multiple Vitamin (MULTIVITAMIN) tablet Take 1 tablet by mouth daily.      . Multiple Vitamins-Minerals (EYE VITAMINS) CAPS Take 1 capsule by mouth daily.    Marland Kitchen NEXIUM 40 MG capsule Take 1 capsule (40 mg total) by mouth daily. 90 capsule 1  . ONETOUCH DELICA LANCETS 99991111 MISC Check blood sugar no more than twice daily 100 each 12  . ranitidine (ZANTAC) 150 MG capsule Take 150 mg by mouth daily as needed.      . Blood Glucose Monitoring Suppl (ADVOCATE REDI-CODE) DEVI by Does not apply route. Reported on 04/22/2015    . Lancet Devices (B-D LANCET DEVICE) MISC B-D Ultra-Fine 22 lancets. Test blood sugar daily. Diagnosis code: 250.00 (Patient not taking: Reported on 05/07/2014) 1 each 11  . lidocaine (XYLOCAINE) 5 % ointment Apply 1 application topically 2 (two) times daily as needed.     . ONE TOUCH ULTRA TEST test strip CHECK ONCE DAILY (Patient not taking: Reported on 11/07/2014) 100 each 11   No current facility-administered medications on file prior to visit.    ALLERGIES: Allergies  Allergen Reactions  . Prevnar [Pneumococcal 13-Val Conj Vacc] Nausea And Vomiting  and Other (See Comments)    Fever of 102.   . Clarithromycin [Clarithromycin]     Contradictions w/Vytorin  . Penicillins   . Tetanus-Diphtheria Toxoids Td     REACTION: see OV note  02-11-10    FAMILY HISTORY: Family History  Problem Relation Age of Onset  . Diabetes Mother     uncles, mother , father   . Colon cancer Neg Hx   . Breast cancer Neg Hx   . Coronary artery disease      F age 11, B age 71, uncle age 33 : MI    SOCIAL HISTORY: Social History   Social History  . Marital Status: Married    Spouse Name: N/A  . Number of Children: 3  . Years of Education:  N/A   Occupational History  . semi-retired,own a busines, Optometrist   .     Social History Main Topics  . Smoking status: Never Smoker   . Smokeless tobacco: Never Used  . Alcohol Use: Yes     Comment: socially  . Drug Use: No  . Sexual Activity: Not on file   Other Topics Concern  . Not on file   Social History Narrative   Original [place]     REVIEW OF SYSTEMS: Constitutional: No fevers, chills, or sweats, no generalized fatigue, change in appetite Eyes: No visual changes, double vision, eye pain Ear, nose and throat: No hearing loss, ear pain, nasal congestion, sore throat Cardiovascular: No chest pain, palpitations Respiratory:  No shortness of breath at rest or with exertion, wheezes GastrointestinaI: No nausea, vomiting, diarrhea, abdominal pain, fecal incontinence Genitourinary:  No dysuria, urinary retention or frequency Musculoskeletal:  No neck pain, back pain Integumentary: No rash, pruritus, skin lesions Neurological: as above Psychiatric: No depression, insomnia, anxiety Endocrine: No palpitations, fatigue, diaphoresis, mood swings, change in appetite, change in weight, increased thirst Hematologic/Lymphatic:  No anemia, purpura, petechiae. Allergic/Immunologic: no itchy/runny eyes, nasal congestion, recent allergic reactions, rashes  PHYSICAL EXAM: Filed Vitals:   05/13/15 1509  BP: 138/68  Pulse: 60   General: No acute distress.  Patient appears well-groomed.  normal body habitus. Head:  Normocephalic/atraumatic Eyes:  Fundoscopic exam unremarkable without vessel changes, exudates, hemorrhages or papilledema. Neck: supple, no paraspinal tenderness, full range of motion Heart:  Regular rate and rhythm Lungs:  Clear to auscultation bilaterally Back: No paraspinal tenderness Neurological Exam: alert and oriented to person, place, and time. Attention span and concentration intact, recent and remote memory intact, fund of knowledge intact.  Speech fluent  and not dysarthric, language intact.  Decreased sensation in right V1-V3 distribution.  Otherwise, CN II-XII intact. Fundoscopic exam unremarkable without vessel changes, exudates, hemorrhages or papilledema.  Bulk and tone normal, muscle strength 5/5 throughout.  Sensation to light touch, temperature and vibration intact.  Deep tendon reflexes 2+ throughout, toes downgoing.  Finger to nose and heel to shin testing intact.  Gait normal, Romberg negative.  IMPRESSION: Probable cryptogenic CVA presenting as left amaurosis fugax and residual right sided numbness. Hypertension Type 2 diabetes mellitus Hyperlipidemia Right carotid artery stenosis.  This is asymptomatic and does not correlate with her symptoms.  PLAN: 1.  Continue ASA 81mg  daily for secondary stroke prevention. 2.  While LDL looks good, goal should be less than 70.  Patient says Vytorin is now too expensive.  I defer to Dr. Larose Kells to start an appropriate statin to achieve LDL goal less than 70.  I will have her repeat fasting lipid panel in 3  months prior to follow up. 3.  Optimize blood pressure control 4.  Continue glycemic control 5.  Routine walking 6.  Mediterranean diet. 7.  Carotid doppler must be repeated annually to follow up asymptomatic carotid artery stenosis. 8.  Follow up in 3 months.  Metta Clines, DO  CC:  Kathlene November, MD

## 2015-05-22 ENCOUNTER — Encounter: Payer: Self-pay | Admitting: Neurology

## 2015-06-13 ENCOUNTER — Telehealth: Payer: Self-pay | Admitting: *Deleted

## 2015-06-13 MED ORDER — ATORVASTATIN CALCIUM 40 MG PO TABS: 40.0000 mg | ORAL_TABLET | Freq: Every day | ORAL | Status: DC

## 2015-06-13 NOTE — Telephone Encounter (Signed)
Patient received mailing informing her that Insurance will no longer cover Vytorin; per provider written orders change in therapy to Lipitor 40, dispense 30 w/3 refills. Rx request to pharmacy, patient informed/SLS

## 2015-06-24 ENCOUNTER — Encounter: Payer: Medicare Other | Admitting: Internal Medicine

## 2015-07-11 ENCOUNTER — Other Ambulatory Visit: Payer: Self-pay | Admitting: Internal Medicine

## 2015-08-11 ENCOUNTER — Other Ambulatory Visit: Payer: Self-pay | Admitting: Internal Medicine

## 2015-08-11 ENCOUNTER — Telehealth: Payer: Self-pay

## 2015-08-11 DIAGNOSIS — I6521 Occlusion and stenosis of right carotid artery: Secondary | ICD-10-CM

## 2015-08-11 NOTE — Telephone Encounter (Signed)
Pt aware that she need to have fasting lipid drawn. Will come up one day this week, per pt.

## 2015-08-11 NOTE — Telephone Encounter (Signed)
Medication filled to pharmacy as requested.   

## 2015-08-12 ENCOUNTER — Other Ambulatory Visit (INDEPENDENT_AMBULATORY_CARE_PROVIDER_SITE_OTHER): Payer: Medicare Other

## 2015-08-12 DIAGNOSIS — I6521 Occlusion and stenosis of right carotid artery: Secondary | ICD-10-CM | POA: Diagnosis not present

## 2015-08-12 LAB — LIPID PANEL
CHOL/HDL RATIO: 3
Cholesterol: 140 mg/dL (ref 0–200)
HDL: 47 mg/dL (ref >39.00)
LDL Cholesterol: 74 mg/dL (ref 0–99)
NonHDL: 93.39
TRIGLYCERIDES: 96 mg/dL (ref 0.0–149.0)
VLDL: 19.2 mg/dL (ref 0.0–40.0)

## 2015-08-14 ENCOUNTER — Other Ambulatory Visit: Payer: Self-pay | Admitting: Internal Medicine

## 2015-08-19 MED ORDER — ATORVASTATIN CALCIUM 80 MG PO TABS: 80.0000 mg | ORAL_TABLET | Freq: Every day | ORAL | Status: DC

## 2015-09-04 DIAGNOSIS — E119 Type 2 diabetes mellitus without complications: Secondary | ICD-10-CM | POA: Diagnosis not present

## 2015-09-04 DIAGNOSIS — H04123 Dry eye syndrome of bilateral lacrimal glands: Secondary | ICD-10-CM | POA: Diagnosis not present

## 2015-09-04 DIAGNOSIS — H524 Presbyopia: Secondary | ICD-10-CM | POA: Diagnosis not present

## 2015-09-04 DIAGNOSIS — H353131 Nonexudative age-related macular degeneration, bilateral, early dry stage: Secondary | ICD-10-CM | POA: Diagnosis not present

## 2015-09-04 DIAGNOSIS — H25813 Combined forms of age-related cataract, bilateral: Secondary | ICD-10-CM | POA: Diagnosis not present

## 2015-09-04 DIAGNOSIS — H5213 Myopia, bilateral: Secondary | ICD-10-CM | POA: Diagnosis not present

## 2015-09-04 DIAGNOSIS — H52223 Regular astigmatism, bilateral: Secondary | ICD-10-CM | POA: Diagnosis not present

## 2015-09-11 ENCOUNTER — Other Ambulatory Visit: Payer: Self-pay | Admitting: Gastroenterology

## 2015-09-11 DIAGNOSIS — K640 First degree hemorrhoids: Secondary | ICD-10-CM | POA: Diagnosis not present

## 2015-09-11 DIAGNOSIS — K317 Polyp of stomach and duodenum: Secondary | ICD-10-CM | POA: Diagnosis not present

## 2015-09-11 DIAGNOSIS — K219 Gastro-esophageal reflux disease without esophagitis: Secondary | ICD-10-CM | POA: Diagnosis not present

## 2015-09-11 DIAGNOSIS — Z86010 Personal history of colonic polyps: Secondary | ICD-10-CM | POA: Diagnosis not present

## 2015-09-11 DIAGNOSIS — R12 Heartburn: Secondary | ICD-10-CM | POA: Diagnosis not present

## 2015-09-11 LAB — HM COLONOSCOPY

## 2015-09-30 ENCOUNTER — Ambulatory Visit (INDEPENDENT_AMBULATORY_CARE_PROVIDER_SITE_OTHER): Payer: Medicare Other | Admitting: Neurology

## 2015-09-30 ENCOUNTER — Encounter: Payer: Self-pay | Admitting: Neurology

## 2015-09-30 VITALS — BP 132/60 | HR 67 | Ht 63.0 in | Wt 206.0 lb

## 2015-09-30 DIAGNOSIS — E785 Hyperlipidemia, unspecified: Secondary | ICD-10-CM

## 2015-09-30 DIAGNOSIS — Z794 Long term (current) use of insulin: Secondary | ICD-10-CM

## 2015-09-30 DIAGNOSIS — I639 Cerebral infarction, unspecified: Secondary | ICD-10-CM

## 2015-09-30 DIAGNOSIS — E119 Type 2 diabetes mellitus without complications: Secondary | ICD-10-CM

## 2015-09-30 DIAGNOSIS — I6521 Occlusion and stenosis of right carotid artery: Secondary | ICD-10-CM | POA: Diagnosis not present

## 2015-09-30 DIAGNOSIS — I10 Essential (primary) hypertension: Secondary | ICD-10-CM

## 2015-09-30 NOTE — Progress Notes (Signed)
NEUROLOGY FOLLOW UP OFFICE NOTE  Casey Bryan [id number]  HISTORY OF PRESENT ILLNESS: Casey Bryan is a 70 year old right-handed woman with hypertension, type II diabetes, GERD, glaucoma, and vitamin D deficiency who follows up for probable cryptogenic stroke presenting as left amaurosis fugax and right sided numbness.  Her daughter is present, who also supplements history.  UPDATE: She is taking ASA 81mg  daily.  Blood pressure and diabetes have been controlled. She was switched to Lipitor 40mg .  Lipid panel from 08/12/15 revealed LDL of 74.  Lipitor was increased to 80mg  daily.  HISTORY: On 03/24/15, she had loss of vision in the left eye, lasting 20 to 30 minutes and then gradually improved.  She saw ophthalmology who suspected stroke or migraine.  She started ASA 81mg  daily.  Since then, she notes numbness and tingling off and on involving the right side of her body (face, arm and leg).  She denies weakness or recurrent visual disturbance.  2D echo showed normal EF of 60%-65% with grade 2 diastolic dysfunction.  Carotid doppler showed 40-59% right ICA stenosis, but 1-39% left ICA stenosis.  Sed Rate was 27.  Hgb A1c was 6.4%.  She had an MRI and MRA of the head performed on 05/04/15, which showed mild small vessel ischemic changes but no acute/subacute infarct or high-grade stenosis or occlusion of the intracranial vessels.   PAST MEDICAL HISTORY: Past Medical History  Diagnosis Date  . Hyperlipemia   . Hypertension   . GERD (gastroesophageal reflux disease)   . Diabetes mellitus   . Glaucoma 04-2012    MEDICATIONS: Current Outpatient Prescriptions on File Prior to Visit  Medication Sig Dispense Refill  . aspirin EC 81 MG tablet Take 81 mg by mouth daily.    Marland Kitchen atorvastatin (LIPITOR) 80 MG tablet Take 1 tablet (80 mg total) by mouth daily. 90 tablet 1  . B-D ULTRA-FINE 33 LANCETS MISC Use to check blood sugars no more than twice daily 100 each 12  . Blood Glucose Monitoring Suppl  (ADVOCATE REDI-CODE) [place] by Does not apply route. Reported on 04/22/2015    . Calcium Carbonate-Vit D-Min (CALCIUM 1200 PO) Take 1,000 mg by mouth.     Elmore Guise Devices (B-D LANCET DEVICE) MISC B-D Ultra-Fine 22 lancets. Test blood sugar daily. Diagnosis code: 250.00 1 each 11  . lidocaine (XYLOCAINE) 5 % ointment Apply 1 application topically 2 (two) times daily as needed.     Marland Kitchen losartan-hydrochlorothiazide (HYZAAR) 100-25 MG tablet Take 1 tablet by mouth daily. 90 tablet 1  . metFORMIN (GLUCOPHAGE) 1000 MG tablet Take 1 tablet (1,000 mg total) by mouth daily with breakfast. 90 tablet 1  . metoprolol (LOPRESSOR) 50 MG tablet Take 1.5 tablets (75 mg total) by mouth 2 (two) times daily. 270 tablet 1  . Multiple Vitamin (MULTIVITAMIN) tablet Take 1 tablet by mouth daily.      . Multiple Vitamins-Minerals (EYE VITAMINS) CAPS Take 1 capsule by mouth daily.    Marland Kitchen NEXIUM 40 MG capsule Take 1 capsule (40 mg total) by mouth daily. 90 capsule 1  . ONE TOUCH ULTRA TEST test strip CHECK ONCE DAILY 100 each 0  . ranitidine (ZANTAC) 150 MG capsule Take 150 mg by mouth daily as needed.       No current facility-administered medications on file prior to visit.    ALLERGIES: Allergies  Allergen Reactions  . Prevnar [Pneumococcal 13-Val Conj Vacc] Nausea And Vomiting and Other (See Comments)    Fever of 102.   Marland Kitchen  Clarithromycin [Clarithromycin]     Contradictions w/Vytorin  . Penicillins   . Tetanus-Diphtheria Toxoids Td     REACTION: see OV note  02-11-10    FAMILY HISTORY: Family History  Problem Relation Age of Onset  . Diabetes Mother     uncles, mother , father   . Colon cancer Neg Hx   . Breast cancer Neg Hx   . Coronary artery disease      F age 35, B age 40, uncle age 40 : MI    SOCIAL HISTORY: Social History   Social History  . Marital Status: Married    Spouse Name: N/A  . Number of Children: 3  . Years of Education: N/A   Occupational History  . semi-retired,own a busines,  Optometrist   .     Social History Main Topics  . Smoking status: Never Smoker   . Smokeless tobacco: Never Used  . Alcohol Use: Yes     Comment: socially  . Drug Use: No  . Sexual Activity: Not on file   Other Topics Concern  . Not on file   Social History Narrative   Original [place]     REVIEW OF SYSTEMS: Constitutional: No fevers, chills, or sweats, no generalized fatigue, change in appetite Eyes: No visual changes, double vision, eye pain Ear, nose and throat: No hearing loss, ear pain, nasal congestion, sore throat Cardiovascular: No chest pain, palpitations Respiratory:  No shortness of breath at rest or with exertion, wheezes GastrointestinaI: No nausea, vomiting, diarrhea, abdominal pain, fecal incontinence Genitourinary:  No dysuria, urinary retention or frequency Musculoskeletal:  No neck pain, back pain Integumentary: No rash, pruritus, skin lesions Neurological: as above Psychiatric: No depression, insomnia, anxiety Endocrine: No palpitations, fatigue, diaphoresis, mood swings, change in appetite, change in weight, increased thirst Hematologic/Lymphatic:  No purpura, petechiae. Allergic/Immunologic: no itchy/runny eyes, nasal congestion, recent allergic reactions, rashes  PHYSICAL EXAM: Filed Vitals:   09/30/15 1434  BP: 132/60  Pulse: 67   General: No acute distress.  Patient appears well-groomed.  Head:  Normocephalic/atraumatic Eyes:  Fundi examined but not visualized Neck: supple, no paraspinal tenderness, full range of motion Heart:  Regular rate and rhythm Lungs:  Clear to auscultation bilaterally Back: No paraspinal tenderness Neurological Exam: alert and oriented to person, place, and time. Attention span and concentration intact, recent and remote memory intact, fund of knowledge intact.  Speech fluent and not dysarthric, language intact.  CN II-XII intact. Bulk and tone normal, muscle strength 5/5 throughout.  Sensation to light touch intact.   Deep tendon reflexes 2+ throughout, toes downgoing.  Finger to nose and heel to shin testing intact.  Gait normal  IMPRESSION: Probable cryptogenic CVA presenting as left amaurosis fugax and residual right sided numbness. Hypertension Type 2 diabetes mellitus Hyperlipidemia Right carotid artery stenosis.   PLAN: 1.  Continue ASA 81mg  daily for secondary stroke prevention. 2.  Continue statin therapy, as managed by Dr. Larose Kells.  LDL goal should be less than 70. 3.  Continue glycemic control 4.  Continue glycemic control 5.  Routine walking 6.  Mediterranean diet. 7.  Repeat carotid doppler in 6 months.  Should be performed annually. 8.  Follow up in 6 months after repeat carotid doppler  15 minutes spent face to face with patient, over 50% spent discussing diagnosis and counseling.  Metta Clines, DO  CC:  Kathlene November, MD

## 2015-09-30 NOTE — Patient Instructions (Signed)
1.  Continue aspirin 81mg  daily 2.  Continue statin therapy as per Dr. Larose Kells. Discuss with Dr. Larose Kells regarding muscle aches 3.  Continue blood pressure and sugar control 4.  Start routine walking 5.  Repeat carotid doppler in 6 months and follow up soon afterwards 6.  Recommend diabetic heart healthy diet.

## 2015-10-06 ENCOUNTER — Encounter: Payer: Self-pay | Admitting: Internal Medicine

## 2015-10-24 ENCOUNTER — Encounter: Payer: Self-pay | Admitting: Internal Medicine

## 2015-10-24 ENCOUNTER — Other Ambulatory Visit: Payer: Self-pay | Admitting: Internal Medicine

## 2015-12-12 ENCOUNTER — Other Ambulatory Visit: Payer: Self-pay | Admitting: Internal Medicine

## 2015-12-12 ENCOUNTER — Encounter: Payer: Self-pay | Admitting: Internal Medicine

## 2015-12-12 ENCOUNTER — Ambulatory Visit (INDEPENDENT_AMBULATORY_CARE_PROVIDER_SITE_OTHER): Payer: Medicare Other | Admitting: Internal Medicine

## 2015-12-12 VITALS — BP 142/76 | HR 56 | Temp 97.6°F | Resp 14 | Ht 63.0 in | Wt 203.2 lb

## 2015-12-12 DIAGNOSIS — E118 Type 2 diabetes mellitus with unspecified complications: Secondary | ICD-10-CM

## 2015-12-12 DIAGNOSIS — G453 Amaurosis fugax: Secondary | ICD-10-CM

## 2015-12-12 DIAGNOSIS — D640 Hereditary sideroblastic anemia: Secondary | ICD-10-CM | POA: Diagnosis not present

## 2015-12-12 DIAGNOSIS — I6521 Occlusion and stenosis of right carotid artery: Secondary | ICD-10-CM

## 2015-12-12 DIAGNOSIS — R51 Headache: Secondary | ICD-10-CM | POA: Diagnosis not present

## 2015-12-12 DIAGNOSIS — E785 Hyperlipidemia, unspecified: Secondary | ICD-10-CM

## 2015-12-12 DIAGNOSIS — I10 Essential (primary) hypertension: Secondary | ICD-10-CM

## 2015-12-12 DIAGNOSIS — I639 Cerebral infarction, unspecified: Secondary | ICD-10-CM | POA: Insufficient documentation

## 2015-12-12 DIAGNOSIS — D509 Iron deficiency anemia, unspecified: Secondary | ICD-10-CM

## 2015-12-12 DIAGNOSIS — Z0000 Encounter for general adult medical examination without abnormal findings: Secondary | ICD-10-CM | POA: Diagnosis not present

## 2015-12-12 DIAGNOSIS — R519 Headache, unspecified: Secondary | ICD-10-CM

## 2015-12-12 DIAGNOSIS — I6300 Cerebral infarction due to thrombosis of unspecified precerebral artery: Secondary | ICD-10-CM

## 2015-12-12 DIAGNOSIS — Z780 Asymptomatic menopausal state: Secondary | ICD-10-CM

## 2015-12-12 HISTORY — DX: Amaurosis fugax: G45.3

## 2015-12-12 LAB — COMPREHENSIVE METABOLIC PANEL
ALK PHOS: 72 U/L (ref 33–130)
ALT: 22 U/L (ref 6–29)
AST: 25 U/L (ref 10–35)
Albumin: 3.9 g/dL (ref 3.6–5.1)
BUN: 15 mg/dL (ref 7–25)
CHLORIDE: 103 mmol/L (ref 98–110)
CO2: 29 mmol/L (ref 20–31)
Calcium: 9.3 mg/dL (ref 8.6–10.4)
Creat: 0.69 mg/dL (ref 0.50–0.99)
GLUCOSE: 87 mg/dL (ref 65–99)
Potassium: 4.4 mmol/L (ref 3.5–5.3)
SODIUM: 141 mmol/L (ref 135–146)
TOTAL PROTEIN: 6.3 g/dL (ref 6.1–8.1)
Total Bilirubin: 0.4 mg/dL (ref 0.2–1.2)

## 2015-12-12 LAB — CBC WITH DIFFERENTIAL/PLATELET
BASOS ABS: 0 {cells}/uL (ref 0–200)
BASOS PCT: 0 %
EOS ABS: 156 {cells}/uL (ref 15–500)
Eosinophils Relative: 3 %
HEMATOCRIT: 33.7 % — AB (ref 35.0–45.0)
Hemoglobin: 11 g/dL — ABNORMAL LOW (ref 11.7–15.5)
LYMPHS PCT: 42 %
Lymphs Abs: 2184 cells/uL (ref 850–3900)
MCH: 27.3 pg (ref 27.0–33.0)
MCHC: 32.6 g/dL (ref 32.0–36.0)
MCV: 83.6 fL (ref 80.0–100.0)
MONO ABS: 416 {cells}/uL (ref 200–950)
MONOS PCT: 8 %
MPV: 10.2 fL (ref 7.5–12.5)
NEUTROS PCT: 47 %
Neutro Abs: 2444 cells/uL (ref 1500–7800)
PLATELETS: 196 10*3/uL (ref 140–400)
RBC: 4.03 MIL/uL (ref 3.80–5.10)
RDW: 15 % (ref 11.0–15.0)
WBC: 5.2 10*3/uL (ref 3.8–10.8)

## 2015-12-12 LAB — LIPID PANEL
CHOL/HDL RATIO: 2.3 ratio (ref <=5.0)
CHOLESTEROL: 127 mg/dL (ref 125–200)
HDL: 55 mg/dL (ref >=46)
LDL Cholesterol: 58 mg/dL (ref <130)
TRIGLYCERIDES: 72 mg/dL (ref <150)
VLDL: 14 mg/dL (ref <30)

## 2015-12-12 MED ORDER — AMLODIPINE BESYLATE 5 MG PO TABS: 5.0000 mg | ORAL_TABLET | Freq: Every day | ORAL | 5 refills | Status: DC

## 2015-12-12 MED ORDER — ATORVASTATIN CALCIUM 80 MG PO TABS: 80.0000 mg | ORAL_TABLET | Freq: Every day | ORAL | 1 refills | Status: DC

## 2015-12-12 MED ORDER — LOSARTAN POTASSIUM-HCTZ 100-25 MG PO TABS: 1.0000 | ORAL_TABLET | Freq: Every day | ORAL | 1 refills | Status: DC

## 2015-12-12 MED ORDER — METFORMIN HCL 1000 MG PO TABS: 1000.0000 mg | ORAL_TABLET | Freq: Every day | ORAL | 1 refills | Status: DC

## 2015-12-12 MED ORDER — METOPROLOL TARTRATE 50 MG PO TABS: 75.0000 mg | ORAL_TABLET | Freq: Two times a day (BID) | ORAL | 1 refills | Status: DC

## 2015-12-12 MED ORDER — GLUCOSE BLOOD VI STRP: ORAL_STRIP | 12 refills | Status: DC

## 2015-12-12 MED ORDER — BD LANCET ULTRAFINE 33G MISC: 12 refills | Status: DC

## 2015-12-12 NOTE — Patient Instructions (Addendum)
GO TO THE FRONT DESK Schedule your next appointment for a  checkup in 3 months  Labs today    ADD amlodipine 5 mg, take one every morning.  Check the  blood pressure 2 or 3 times a  weekly  Be sure your blood pressure is between 110/65 and  145/85. If it is consistently higher or lower, let me know       Fall Prevention and Home Safety Falls cause injuries and can affect all age groups. It is possible to use preventive measures to significantly decrease the likelihood of falls. There are many simple measures which can make your home safer and prevent falls. OUTDOORS  Repair cracks and edges of walkways and driveways.  Remove high doorway thresholds.  Trim shrubbery on the main path into your home.  Have good outside lighting.  Clear walkways of tools, rocks, debris, and clutter.  Check that handrails are not broken and are securely fastened. Both sides of steps should have handrails.  Have leaves, snow, and ice cleared regularly.  Use sand or salt on walkways during winter months.  In the garage, clean up grease or oil spills. BATHROOM  Install night lights.  Install grab bars by the toilet and in the tub and shower.  Use non-skid mats or decals in the tub or shower.  Place a plastic non-slip stool in the shower to sit on, if needed.  Keep floors dry and clean up all water on the floor immediately.  Remove soap buildup in the tub or shower on a regular basis.  Secure bath mats with non-slip, double-sided rug tape.  Remove throw rugs and tripping hazards from the floors. BEDROOMS  Install night lights.  Make sure a bedside light is easy to reach.  Do not use oversized bedding.  Keep a telephone by your bedside.  Have a firm chair with side arms to use for getting dressed.  Remove throw rugs and tripping hazards from the floor. KITCHEN  Keep handles on pots and pans turned toward the center of the stove. Use back burners when possible.  Clean up  spills quickly and allow time for drying.  Avoid walking on wet floors.  Avoid hot utensils and knives.  Position shelves so they are not too high or low.  Place commonly used objects within easy reach.  If necessary, use a sturdy step stool with a grab bar when reaching.  Keep electrical cables out of the way.  Do not use floor polish or wax that makes floors slippery. If you must use wax, use non-skid floor wax.  Remove throw rugs and tripping hazards from the floor. STAIRWAYS  Never leave objects on stairs.  Place handrails on both sides of stairways and use them. Fix any loose handrails. Make sure handrails on both sides of the stairways are as long as the stairs.  Check carpeting to make sure it is firmly attached along stairs. Make repairs to worn or loose carpet promptly.  Avoid placing throw rugs at the top or bottom of stairways, or properly secure the rug with carpet tape to prevent slippage. Get rid of throw rugs, if possible.  Have an electrician put in a light switch at the top and bottom of the stairs. OTHER FALL PREVENTION TIPS  Wear low-heel or rubber-soled shoes that are supportive and fit well. Wear closed toe shoes.  When using a stepladder, make sure it is fully opened and both spreaders are firmly locked. Do not climb a closed stepladder.  Add color or contrast paint or tape to grab bars and handrails in your home. Place contrasting color strips on first and last steps.  Learn and use mobility aids as needed. Install an electrical emergency response system.  Turn on lights to avoid dark areas. Replace light bulbs that burn out immediately. Get light switches that glow.  Arrange furniture to create clear pathways. Keep furniture in the same place.  Firmly attach carpet with non-skid or double-sided tape.  Eliminate uneven floor surfaces.  Select a carpet pattern that does not visually hide the edge of steps.  Be aware of all pets. OTHER HOME SAFETY  TIPS  Set the water temperature for 120 F (48.8 C).  Keep emergency numbers on or near the telephone.  Keep smoke detectors on every level of the home and near sleeping areas. Document Released: 03/26/2002 Document Revised: 10/05/2011 Document Reviewed: 06/25/2011 [facility] Patient Information 2015 [place], [place]. This information is not intended to replace advice given to you by your health care provider. Make sure you discuss any questions you have with your health care provider.   Preventive Care for Adults Ages 5 and over  Blood pressure check.** / Every 1 to 2 years.  Lipid and cholesterol check.**/ Every 5 years beginning at age 51.  Lung cancer screening. / Every year if you are aged 48-80 years and have a 30-pack-year history of smoking and currently smoke or have quit within the past 15 years. Yearly screening is stopped once you have quit smoking for at least 15 years or develop a health problem that would prevent you from having lung cancer treatment.  Fecal occult blood test (FOBT) of stool. / Every year beginning at age 71 and continuing until age 72. You may not have to do this test if you get a colonoscopy every 10 years.  Flexible sigmoidoscopy** or colonoscopy.** / Every 5 years for a flexible sigmoidoscopy or every 10 years for a colonoscopy beginning at age 22 and continuing until age 69.  Hepatitis C blood test.** / For all people born from [birth year] through 1964 and any individual with known risks for hepatitis C.  Abdominal aortic aneurysm (AAA) screening.** / A one-time screening for ages 35 to 34 years who are current or former smokers.  Skin self-exam. / Monthly.  Influenza vaccine. / Every year.  Tetanus, diphtheria, and acellular pertussis (Tdap/Td) vaccine.** / 1 dose of Td every 10 years.  Varicella vaccine.** / Consult your health care provider.  Zoster vaccine.** / 1 dose for adults aged 78 years or older.  Pneumococcal 13-valent conjugate (PCV13)  vaccine.** / Consult your health care provider.  Pneumococcal polysaccharide (PPSV23) vaccine.** / 1 dose for all adults aged 69 years and older.  Meningococcal vaccine.** / Consult your health care provider.  Hepatitis A vaccine.** / Consult your health care provider.  Hepatitis B vaccine.** / Consult your health care provider.  Haemophilus influenzae type b (Hib) vaccine.** / Consult your health care provider. **Family history and personal history of risk and conditions may change your health care provider's recommendations. Document Released: 06/01/2001 Document Revised: 04/10/2013 Document Reviewed: 08/31/2010 [facility] Patient Information 2015 [place], [place]. This information is not intended to replace advice given to you by your health care provider. Make sure you discuss any questions you have with your health care provider.

## 2015-12-12 NOTE — Progress Notes (Signed)
Subjective:    Patient ID: Casey Bryan, female    DOB: [birth year], 70 y.o.   MRN: [id number]  DOS:  12/12/2015 Type of visit - description :  Here for Medicare AWV:  1. Risk factors based on Past M, S, F history: reviewed 2. Physical Activities:  Very active, g-children, occ takes a walk 3. Depression/mood: neg screening  4. Hearing:  No problems noted or reported  5. ADL's: independent, drives  6. Fall Risk: no recent falls, prevention discussed , see AVS 7. home Safety: does feel safe at home  8. Height, weight, & visual acuity: see VS, sees eye doctor regulalrly 9. Counseling: provided 10. Labs ordered based on risk factors: if needed  11. Referral Coordination: if needed 12. Care Plan, see assessment and plan , written personalized plan provided , see AVS 13. Cognitive Assessment: motor skills and cognition appropriate for age 64. Care team updated  today 15. End-of-life care: discussed a HC POA  In addition, today we discussed the following: HTN: Good med compliance, ambulatory BPs range from 140-180. Diastolic BP 123XX123. DM: She stays active, ambulatory blood sugars in the low 100s. Had a colonoscopy and EGD, reports reviewed.  Wt Readings from Last 3 Encounters:  12/12/15 203 lb 4 oz (92.2 kg)  09/30/15 206 lb (93.4 kg)  05/13/15 207 lb (93.9 kg)     Review of Systems Constitutional: No fever. No chills. No unexplained wt changes. No unusual sweats  HEENT: No dental problems, no ear discharge, no facial swelling, no voice changes. No eye discharge, no eye  redness , no  intolerance to light   Respiratory: No wheezing , no  difficulty breathing. No cough , no mucus production  Cardiovascular: No CP, no leg swelling , no  Palpitations  GI: no nausea, no vomiting, no diarrhea , no  abdominal pain.  No blood in the stools. No dysphagia, no odynophagia    Endocrine: No polyphagia, no polyuria , no polydipsia  GU: No dysuria, gross hematuria, difficulty urinating.  No urinary urgency, no frequency.  Musculoskeletal: No joint swellings or unusual aches or pains  Skin: No change in the color of the skin, palor , no  Rash  Allergic, immunologic: No environmental allergies , no  food allergies  Neurological: No dizziness no  syncope. No headaches. No diplopia, no slurred, no slurred speech, no motor deficits, no facial  Numbness. More than 1 year history of headaches, almost daily, mild to moderate, sometimes in the front, sometimes in the back. Most times headache is in the afternoon Usually decreased with rest and after she drinks some water or coffee (has 2 cups of coffee every morning). No associated nausea  Hematological: No enlarged lymph nodes, no easy bruising , no unusual bleedings  Psychiatry: No suicidal ideas, no hallucinations, no beavior problems, no confusion.  No unusual/severe anxiety, no depression   Past Medical History:  Diagnosis Date  . Amaurosis fugax 12/12/2015  . Diabetes mellitus   . GERD (gastroesophageal reflux disease)   . Glaucoma 04-2012  . Hyperlipemia   . Hypertension     Past Surgical History:  Procedure Laterality Date  . NO PAST SURGERIES      Social History   Social History  . Marital status: Married    Spouse name: N/A  . Number of children: 3  . Years of education: N/A   Occupational History  . semi-retired,own a busines, accountant    Social History Main Topics  . Smoking status: Never  Smoker  . Smokeless tobacco: Never Used  . Alcohol use Yes     Comment: socially  . Drug use: No  . Sexual activity: Not on file   Other Topics Concern  . Not on file   Social History Narrative   Original [place]      Family History  Problem Relation Age of Onset  . Diabetes Mother     uncles, mother , father   . Coronary artery disease      F age 41, B age 17, uncle age 29 : MI  . Colon cancer Neg Hx   . Breast cancer Neg Hx        Medication List       Accurate as of 12/12/15 11:59 PM.  Always use your most recent med list.          [place] by Does not apply route. Reported on 04/22/2015   amLODipine 5 MG tablet Commonly known as:  NORVASC Take 1 tablet (5 mg total) by mouth daily.   aspirin EC 81 MG tablet Take 81 mg by mouth daily.   atorvastatin 80 MG tablet Commonly known as:  LIPITOR Take 1 tablet (80 mg total) by mouth daily.   B-D ULTRA-FINE 33 LANCETS Misc Use to check blood sugars no more than twice daily   CALCIUM 1200 PO Take 1,000 mg by mouth.   EYE VITAMINS Caps Take 1 capsule by mouth daily.   glucose blood test strip Commonly known as:  ONE TOUCH ULTRA TEST Check blood sugar no more than twice daily   lidocaine 5 % ointment Commonly known as:  XYLOCAINE Apply 1 application topically 2 (two) times daily as needed.   losartan-hydrochlorothiazide 100-25 MG tablet Commonly known as:  HYZAAR Take 1 tablet by mouth daily.   metFORMIN 1000 MG tablet Commonly known as:  GLUCOPHAGE Take 1 tablet (1,000 mg total) by mouth daily with breakfast.   metoprolol 50 MG tablet Commonly known as:  LOPRESSOR Take 1.5 tablets (75 mg total) by mouth 2 (two) times daily.   multivitamin tablet Take 1 tablet by mouth daily.   NEXIUM 40 MG capsule Generic drug:  esomeprazole Take 1 capsule (40 mg total) by mouth daily.   ranitidine 150 MG capsule Commonly known as:  ZANTAC Take 150 mg by mouth daily as needed.          Objective:   Physical Exam BP (!) 142/76 (BP Location: Left Arm, Patient Position: Sitting, Cuff Size: Normal)   Pulse (!) 56   Temp 97.6 F (36.4 C) (Oral)   Resp 14   Ht 5\' 3"  (1.6 m)   Wt 203 lb 4 oz (92.2 kg)   SpO2 98%   BMI 36.00 kg/m   General:   Well developed, well nourished . NAD.  Neck: No  thyromegaly  HEENT:  Normocephalic . Face symmetric, atraumatic Lungs:  CTA B Normal respiratory effort, no intercostal retractions, no accessory muscle use. Heart: RRR,  no murmur.  No pretibial  edema bilaterally  Abdomen:  Not distended, soft, non-tender. No rebound or rigidity.   Skin: Exposed areas without rash. Not pale. Not jaundice Neurologic:  alert & oriented X3.  Speech normal, gait appropriate for age and unassisted Strength symmetric and appropriate for age.  Psych: Cognition and judgment appear intact.  Cooperative with normal attention span and concentration.  Behavior appropriate. No anxious or depressed appearing.    Assessment & Plan:    Assessment: DM HTN Hyperlipidemia Glaucoma  dx 2014, cataracts NEURO: L amaurosix fugax 03-2015 -- saw opthalmology, started ASA  (02-2015); echo 04-09-15: nl EF, grade 2 Diast disfx -- saw neuro: Probable cryptogenic CVA, Rx med therapy --Headaches: Saw neurology 2015 CV: Carotid artery disease:   [place] 04-2015: Progression of the right ICA now 40-59%. Stable left ICA 1-39%. Gastri polyp removal 08-2015, Dr Oletta Lamas  PLAN: DM: Doing well with lifestyle, continue metformin, check A1c. HTN: Needs better control, continue Hyzaar, metoprolol, add amlodipine 5 mg. Hyperlipidemia: Last LDL was satisfactory but she was taking Vytorin, now on Lipitor 80 mg. Check a FLP, CMP. History of stroke: control CV RF Headaches: Chronic, tensional? Related to BP?  Reassess on RTC RTC 3 months

## 2015-12-12 NOTE — Progress Notes (Signed)
Pre visit review using our clinic review tool, if applicable. No additional management support is needed unless otherwise documented below in the visit note. 

## 2015-12-12 NOTE — Assessment & Plan Note (Addendum)
Td 2011; Pneumonia 2008; prevnar 2015 (had a reaction) Zostavax benefits discussed before   +FH CV disease cscope Dr Oletta Lamas ~ 2008, normal, cscope again 12-2011, + polyps, cscope 08-2015, next per GI  MMG 12-2013, states will schedule one  PAP last PAP 2015,  normal; no history of abnormal Paps   Bone density test was done at gynecology ~ 2012, reportedly normal, no records. On calcium and vitamin D. Check a bone density test Does take  daily calcium and vitamin D.

## 2015-12-13 LAB — HEMOGLOBIN A1C
Hgb A1c MFr Bld: 6.1 % — ABNORMAL HIGH (ref <5.7)
Mean Plasma Glucose: 128 mg/dL

## 2015-12-13 LAB — TSH: TSH: 0.81 m[IU]/L

## 2015-12-14 NOTE — Assessment & Plan Note (Signed)
DM: Doing well with lifestyle, continue metformin, check A1c. HTN: Needs better control, continue Hyzaar, metoprolol, add amlodipine 5 mg. Hyperlipidemia: Last LDL was satisfactory but she was taking Vytorin, now on Lipitor 80 mg. Check a FLP, CMP. History of stroke: control CV RF Headaches: Chronic, tensional? Related to BP?  Reassess on RTC RTC 3 months

## 2015-12-15 LAB — IRON AND TIBC
%SAT: 13 % (ref 11–50)
IRON: 46 ug/dL (ref 45–160)
TIBC: 352 ug/dL (ref 250–450)
UIBC: 306 ug/dL (ref 125–400)

## 2015-12-15 LAB — FERRITIN: FERRITIN: 19 ng/mL — AB (ref 20–288)

## 2015-12-17 NOTE — Addendum Note (Signed)
Addended byDamita Dunnings D on: 12/17/2015 02:46 PM   Modules accepted: Orders

## 2015-12-25 ENCOUNTER — Ambulatory Visit (HOSPITAL_BASED_OUTPATIENT_CLINIC_OR_DEPARTMENT_OTHER)
Admission: RE | Admit: 2015-12-25 | Discharge: 2015-12-25 | Disposition: A | Discharge status: Home / Self Care | Payer: Medicare Other | Source: Ambulatory Visit | Attending: Internal Medicine | Admitting: Internal Medicine

## 2015-12-25 DIAGNOSIS — M85861 Other specified disorders of bone density and structure, right lower leg: Secondary | ICD-10-CM | POA: Diagnosis not present

## 2015-12-25 DIAGNOSIS — Z13820 Encounter for screening for osteoporosis: Secondary | ICD-10-CM | POA: Insufficient documentation

## 2015-12-25 DIAGNOSIS — Z780 Asymptomatic menopausal state: Secondary | ICD-10-CM | POA: Insufficient documentation

## 2015-12-25 DIAGNOSIS — M85851 Other specified disorders of bone density and structure, right thigh: Secondary | ICD-10-CM | POA: Insufficient documentation

## 2015-12-25 DIAGNOSIS — E119 Type 2 diabetes mellitus without complications: Secondary | ICD-10-CM | POA: Insufficient documentation

## 2016-01-02 ENCOUNTER — Telehealth: Payer: Self-pay

## 2016-01-02 NOTE — Telephone Encounter (Signed)
PA has been approved for One touch Lancets 33G 100's PC

## 2016-02-08 ENCOUNTER — Other Ambulatory Visit: Payer: Self-pay | Admitting: Internal Medicine

## 2016-03-10 ENCOUNTER — Encounter: Payer: Self-pay | Admitting: Internal Medicine

## 2016-03-10 ENCOUNTER — Ambulatory Visit (INDEPENDENT_AMBULATORY_CARE_PROVIDER_SITE_OTHER): Payer: Medicare Other | Admitting: Internal Medicine

## 2016-03-10 VITALS — BP 124/74 | HR 57 | Temp 97.6°F | Resp 12 | Ht 63.0 in | Wt 204.2 lb

## 2016-03-10 DIAGNOSIS — D649 Anemia, unspecified: Secondary | ICD-10-CM

## 2016-03-10 DIAGNOSIS — E118 Type 2 diabetes mellitus with unspecified complications: Secondary | ICD-10-CM | POA: Diagnosis not present

## 2016-03-10 DIAGNOSIS — I10 Essential (primary) hypertension: Secondary | ICD-10-CM | POA: Diagnosis not present

## 2016-03-10 DIAGNOSIS — I6521 Occlusion and stenosis of right carotid artery: Secondary | ICD-10-CM | POA: Diagnosis not present

## 2016-03-10 LAB — CBC WITH DIFFERENTIAL/PLATELET
BASOS PCT: 0.3 % (ref 0.0–3.0)
Basophils Absolute: 0 10*3/uL (ref 0.0–0.1)
EOS ABS: 0.1 10*3/uL (ref 0.0–0.7)
Eosinophils Relative: 1.5 % (ref 0.0–5.0)
HCT: 34.5 % — ABNORMAL LOW (ref 36.0–46.0)
HEMOGLOBIN: 11.7 g/dL — AB (ref 12.0–15.0)
Lymphocytes Relative: 30.2 % (ref 12.0–46.0)
Lymphs Abs: 1.7 10*3/uL (ref 0.7–4.0)
MCHC: 33.8 g/dL (ref 30.0–36.0)
MCV: 83.2 fl (ref 78.0–100.0)
MONO ABS: 0.4 10*3/uL (ref 0.1–1.0)
Monocytes Relative: 7.2 % (ref 3.0–12.0)
NEUTROS PCT: 60.8 % (ref 43.0–77.0)
Neutro Abs: 3.5 10*3/uL (ref 1.4–7.7)
PLATELETS: 215 10*3/uL (ref 150.0–400.0)
RBC: 4.14 Mil/uL (ref 3.87–5.11)
RDW: 14.9 % (ref 11.5–15.5)
WBC: 5.7 10*3/uL (ref 4.0–10.5)

## 2016-03-10 LAB — IBC PANEL
IRON: 94 ug/dL (ref 42–145)
Saturation Ratios: 26.2 % (ref 20.0–50.0)
TRANSFERRIN: 256 mg/dL (ref 212.0–360.0)

## 2016-03-10 NOTE — Patient Instructions (Signed)
GO TO THE LAB : Get the blood work     GO TO THE FRONT DESK Schedule your next appointment for a  routine checkup in 4 months    Check the  blood pressure 2 or 3 times a  Week  Be sure your blood pressure is between 110/65 and  145/85. If it is consistently higher or lower, let me know   Rest, fluids, Tylenol. Call if no better  Return the stool test

## 2016-03-10 NOTE — Progress Notes (Signed)
Pre visit review using our clinic review tool, if applicable. No additional management support is needed unless otherwise documented below in the visit note. 

## 2016-03-10 NOTE — Progress Notes (Signed)
Subjective:    Patient ID: Casey Bryan, female    DOB: 1945-09-25, 70 y.o.   MRN: [id number]  DOS:  03/10/2016 Type of visit - description : rov Interval history: Here for his routine medical care. HTN: See last visit, we added amlodipine for poorly controlled BP, she was feeling "drunk" and had blurred vision, after 6 weeks she had to stop it. BPs with or without amlodipine at home are in the 130/80. BP today is very good.  Also, 3 days ago felt really fatigued, just generalized lack of energy. For a couple of days essentially staying bed all day. Had a ill-defined dizziness. Noted on the anterior left sided chest wall was tender to palpation but there was no chest pain with moving her torso, breathing or walking.  Today for the first time she feels better but has noted some sore throat when she swallows.   Review of Systems No fever chills No sinus pain or congestion but for the last 3 days she is having postnasal dripping at night No dyspnea on exertion. No difficulty breathing at rest No nausea vomiting diarrhea No heartburn No cough. No myalgias or atypical arthralgias. Has a very mild headache. + Episodic hemorrhoidal bleed  Past Medical History:  Diagnosis Date  . Amaurosis fugax 12/12/2015  . Diabetes mellitus   . GERD (gastroesophageal reflux disease)   . Glaucoma 04-2012  . Hyperlipemia   . Hypertension     Past Surgical History:  Procedure Laterality Date  . NO PAST SURGERIES      Social History   Social History  . Marital status: Married    Spouse name: N/A  . Number of children: 3  . Years of education: N/A   Occupational History  . semi-retired,own a busines, accountant    Social History Main Topics  . Smoking status: Never Smoker  . Smokeless tobacco: Never Used  . Alcohol use Yes     Comment: socially  . Drug use: No  . Sexual activity: Not on file   Other Topics Concern  . Not on file   Social History Narrative   Original [place]          Medication List       Accurate as of 03/10/16 11:59 PM. Always use your most recent med list.          [place] by Does not apply route. Reported on 04/22/2015   aspirin EC 81 MG tablet Take 81 mg by mouth daily.   atorvastatin 80 MG tablet Commonly known as:  LIPITOR Take 1 tablet (80 mg total) by mouth daily.   B-D ULTRA-FINE 33 LANCETS Misc Use to check blood sugars no more than twice daily   CALCIUM 1200 PO Take 1,000 mg by mouth.   EYE VITAMINS Caps Take 1 capsule by mouth daily.   glucose blood test strip Commonly known as:  ONE TOUCH ULTRA TEST Check blood sugar no more than twice daily   lidocaine 5 % ointment Commonly known as:  XYLOCAINE Apply 1 application topically 2 (two) times daily as needed.   losartan-hydrochlorothiazide 100-25 MG tablet Commonly known as:  HYZAAR Take 1 tablet by mouth daily.   metFORMIN 1000 MG tablet Commonly known as:  GLUCOPHAGE Take 1 tablet (1,000 mg total) by mouth daily with breakfast.   metoprolol 50 MG tablet Commonly known as:  LOPRESSOR Take 1.5 tablets (75 mg total) by mouth 2 (two) times daily.   multivitamin tablet Take 1 tablet  by mouth daily.   NEXIUM 40 MG capsule Generic drug:  esomeprazole Take 1 capsule (40 mg total) by mouth daily.   ranitidine 150 MG capsule Commonly known as:  ZANTAC Take 150 mg by mouth daily as needed.          Objective:   Physical Exam BP 124/74 (BP Location: Left Arm, Patient Position: Sitting, Cuff Size: Normal)   Pulse (!) 57   Temp 97.6 F (36.4 C) (Oral)   Resp 12   Ht 5\' 3"  (1.6 m)   Wt 204 lb 4 oz (92.6 kg)   SpO2 97%   BMI 36.18 kg/m  General:   Well developed, well nourished . NAD.  HEENT:  Normocephalic . Face symmetric, atraumatic. Ears: Normal TMs, throat symmetric, no red. Nose not congested. Lungs:  CTA B Normal respiratory effort, no intercostal retractions, no accessory muscle use.  chest wall: Slightly TTP at the  left costochondral area. Heart: RRR,  no murmur.  no pretibial edema bilaterally  Abdomen:  Not distended, soft, non-tender. No rebound or rigidity.  Skin: Not pale. Not jaundice Neurologic:  alert & oriented X3.  Speech normal, gait appropriate for age and unassisted Psych--  Cognition and judgment appear intact.  Cooperative with normal attention span and concentration.  Behavior appropriate. No anxious or depressed appearing.    Assessment & Plan:   Assessment: DM HTN (intolerant amlodipine 11-2015.) Hyperlipidemia Glaucoma dx 2014, cataracts NEURO: L amaurosix fugax 03-2015 -- saw opthalmology, started ASA  (02-2015); echo 04-09-15: nl EF, grade 2 Diast disfx -- saw neuro: Probable cryptogenic  CVA, Rx med therapy --Headaches: Saw neurology 2015 CV: Carotid artery disease:   [place] 04-2015: Progression of the right ICA now 40-59%. Stable left ICA 1-39%. Gastri polyp removal 08-2015, Dr Oletta Lamas  PLAN: Viral syndrome?: Complain of fatigue, sore throat, chest wall pain. Exam is benign, no reflux symptoms. Recommend rest and observation. HTN: Took amlodipine for 6 weeks, was intolerant, off amlodipine for more than a month. BP today is okay, will continue with Lopressor, Hyzaar. DM: Continue metformin, well-controlled Mild anemia noted: Recheck a CBC, iron studies, check Hemoccult. Did report that every 2 or 3 weeks has blood in the rectum from hemorrhoids according to the patient, small amounts (few mL's?) If she has severe anemia, will consider referral for treatment of hemorrhoids; last colonoscopy 08/2015 RTC 4 months.

## 2016-03-12 NOTE — Assessment & Plan Note (Signed)
Viral syndrome?: Complain of fatigue, sore throat, chest wall pain. Exam is benign, no reflux symptoms. Recommend rest and observation. HTN: Took amlodipine for 6 weeks, was intolerant, off amlodipine for more than a month. BP today is okay, will continue with Lopressor, Hyzaar. DM: Continue metformin, well-controlled Mild anemia noted: Recheck a CBC, iron studies, check Hemoccult. Did report that every 2 or 3 weeks has blood in the rectum from hemorrhoids according to the patient, small amounts (few mL's?) If she has severe anemia, will consider referral for treatment of hemorrhoids; last colonoscopy 08/2015 RTC 4 months.

## 2016-03-16 ENCOUNTER — Telehealth: Payer: Self-pay

## 2016-03-16 NOTE — Telephone Encounter (Signed)
Received medical necessity form for diabetic supplies. Form completed and mailed back to [facility] w/ envelope provided. Address below:  Medicare [place] [address]  Copy of forms sent for scanning.

## 2016-03-17 ENCOUNTER — Other Ambulatory Visit: Payer: Self-pay | Admitting: Cardiovascular Disease

## 2016-03-17 DIAGNOSIS — I6523 Occlusion and stenosis of bilateral carotid arteries: Secondary | ICD-10-CM

## 2016-03-18 ENCOUNTER — Ambulatory Visit (HOSPITAL_COMMUNITY)
Admission: RE | Admit: 2016-03-18 | Discharge: 2016-03-18 | Disposition: A | Discharge status: Home / Self Care | Payer: Medicare Other | Source: Ambulatory Visit | Attending: Cardiology | Admitting: Cardiology

## 2016-03-18 DIAGNOSIS — I6523 Occlusion and stenosis of bilateral carotid arteries: Secondary | ICD-10-CM | POA: Insufficient documentation

## 2016-03-31 ENCOUNTER — Ambulatory Visit (INDEPENDENT_AMBULATORY_CARE_PROVIDER_SITE_OTHER): Payer: Medicare Other | Admitting: Neurology

## 2016-03-31 ENCOUNTER — Encounter: Payer: Self-pay | Admitting: Neurology

## 2016-03-31 VITALS — BP 130/80 | HR 72 | Ht 63.0 in | Wt 205.2 lb

## 2016-03-31 DIAGNOSIS — I6523 Occlusion and stenosis of bilateral carotid arteries: Secondary | ICD-10-CM

## 2016-03-31 DIAGNOSIS — I639 Cerebral infarction, unspecified: Secondary | ICD-10-CM | POA: Diagnosis not present

## 2016-03-31 DIAGNOSIS — E119 Type 2 diabetes mellitus without complications: Secondary | ICD-10-CM | POA: Diagnosis not present

## 2016-03-31 DIAGNOSIS — G453 Amaurosis fugax: Secondary | ICD-10-CM | POA: Diagnosis not present

## 2016-03-31 DIAGNOSIS — E785 Hyperlipidemia, unspecified: Secondary | ICD-10-CM | POA: Diagnosis not present

## 2016-03-31 DIAGNOSIS — I10 Essential (primary) hypertension: Secondary | ICD-10-CM

## 2016-03-31 DIAGNOSIS — Z794 Long term (current) use of insulin: Secondary | ICD-10-CM

## 2016-03-31 NOTE — Patient Instructions (Addendum)
1.  Continue aspirin 81mg  daily 2.  Continue lipitor 80mg  daily 3.  Continue medications for blood pressure and diabetes 4..  Mediterranean diet 5.  Repeat carotid doppler in 1 year.  Follow up soon afterwards.

## 2016-03-31 NOTE — Progress Notes (Signed)
NEUROLOGY FOLLOW UP OFFICE NOTE  AXIE KNOCHE [id number]  HISTORY OF PRESENT ILLNESS: Casey Bryan is a 70 year old right-handed woman with hypertension, type II diabetes, GERD, glaucoma, and vitamin D deficiency who follows up for probable cryptogenic stroke presenting as left amaurosis fugax and right sided numbness.  Her daughter is present, who also supplements history.   UPDATE: She is doing well. She is taking ASA 81mg  daily.  Blood pressure and diabetes have been controlled. She takes Lipitor 80mg .  Lipid panel from 12/12/15 revealed LDL of 58.   Carotid doppler from 03/18/16 demonstrated progression of stenosis, now with both ICAs 40-59%.   HISTORY: On 03/24/15, she had loss of vision in the left eye, lasting 20 to 30 minutes and then gradually improved.  She saw ophthalmology who suspected stroke or migraine.  She started ASA 81mg  daily.  Since then, she noted numbness and tingling off and on involving the right side of her body (face, arm and leg).  She denies weakness or recurrent visual disturbance.  2D echo showed normal EF of 60%-65% with grade 2 diastolic dysfunction.  Carotid doppler showed 40-59% right ICA stenosis, but 1-39% left ICA stenosis.  Sed Rate was 27.  Hgb A1c was 6.4%.  She had an MRI and MRA of the head performed on 05/04/15, which showed mild small vessel ischemic changes but no acute/subacute infarct or high-grade stenosis or occlusion of the intracranial vessels.  PAST MEDICAL HISTORY: Past Medical History:  Diagnosis Date  . Amaurosis fugax 12/12/2015  . Diabetes mellitus   . GERD (gastroesophageal reflux disease)   . Glaucoma 04-2012  . Hyperlipemia   . Hypertension     MEDICATIONS: Current Outpatient Prescriptions on File Prior to Visit  Medication Sig Dispense Refill  . aspirin EC 81 MG tablet Take 81 mg by mouth daily.    Marland Kitchen atorvastatin (LIPITOR) 80 MG tablet Take 1 tablet (80 mg total) by mouth daily. 90 tablet 2  . B-D ULTRA-FINE 33 LANCETS MISC  Use to check blood sugars no more than twice daily 100 each 12  . Blood Glucose Monitoring Suppl (ADVOCATE REDI-CODE) [place] by Does not apply route. Reported on 04/22/2015    . Calcium Carbonate-Vit D-Min (CALCIUM 1200 PO) Take 1,000 mg by mouth.     Marland Kitchen glucose blood (ONE TOUCH ULTRA TEST) test strip Check blood sugar no more than twice daily 100 each 12  . lidocaine (XYLOCAINE) 5 % ointment Apply 1 application topically 2 (two) times daily as needed.     Marland Kitchen losartan-hydrochlorothiazide (HYZAAR) 100-25 MG tablet Take 1 tablet by mouth daily. 90 tablet 1  . metFORMIN (GLUCOPHAGE) 1000 MG tablet Take 1 tablet (1,000 mg total) by mouth daily with breakfast. 90 tablet 1  . metoprolol (LOPRESSOR) 50 MG tablet Take 1.5 tablets (75 mg total) by mouth 2 (two) times daily. 270 tablet 1  . Multiple Vitamin (MULTIVITAMIN) tablet Take 1 tablet by mouth daily.      . Multiple Vitamins-Minerals (EYE VITAMINS) CAPS Take 1 capsule by mouth daily.    Marland Kitchen NEXIUM 40 MG capsule Take 1 capsule (40 mg total) by mouth daily. 90 capsule 3  . ranitidine (ZANTAC) 150 MG capsule Take 150 mg by mouth daily as needed.       No current facility-administered medications on file prior to visit.     ALLERGIES: Allergies  Allergen Reactions  . Prevnar [Pneumococcal 13-Val Conj Vacc] Nausea And Vomiting and Other (See Comments)    Fever of  102.   . Clarithromycin [Clarithromycin]     Contradictions w/Vytorin  . Penicillins   . Tetanus-Diphtheria Toxoids Td     REACTION: see OV note  02-11-10    FAMILY HISTORY: Family History  Problem Relation Age of Onset  . Diabetes Mother     uncles, mother , father   . Coronary artery disease      F age 87, B age 81, uncle age 35 : MI  . Colon cancer Neg Hx   . Breast cancer Neg Hx     SOCIAL HISTORY: Social History   Social History  . Marital status: Married    Spouse name: N/A  . Number of children: 3  . Years of education: N/A   Occupational History  . semi-retired,own  a busines, accountant    Social History Main Topics  . Smoking status: Never Smoker  . Smokeless tobacco: Never Used  . Alcohol use Yes     Comment: socially  . Drug use: No  . Sexual activity: Not on file   Other Topics Concern  . Not on file   Social History Narrative   Original [place]     REVIEW OF SYSTEMS: Constitutional: No fevers, chills, or sweats, no generalized fatigue, change in appetite Eyes: No visual changes, double vision, eye pain Ear, nose and throat: No hearing loss, ear pain, nasal congestion, sore throat Cardiovascular: No chest pain, palpitations Respiratory:  No shortness of breath at rest or with exertion, wheezes GastrointestinaI: No nausea, vomiting, diarrhea, abdominal pain, fecal incontinence Genitourinary:  No dysuria, urinary retention or frequency Musculoskeletal:  No neck pain, back pain Integumentary: No rash, pruritus, skin lesions Neurological: as above Psychiatric: No depression, insomnia, anxiety Endocrine: No palpitations, fatigue, diaphoresis, mood swings, change in appetite, change in weight, increased thirst Hematologic/Lymphatic:  No purpura, petechiae. Allergic/Immunologic: no itchy/runny eyes, nasal congestion, recent allergic reactions, rashes  PHYSICAL EXAM: Vitals:   03/31/16 1409  BP: 130/80  Pulse: 72   General: No acute distress.  Patient appears well-groomed.   Head:  Normocephalic/atraumatic Eyes:  Fundi examined but not visualized Neck: supple, no paraspinal tenderness, full range of motion Heart:  Regular rate and rhythm Lungs:  Clear to auscultation bilaterally Back: No paraspinal tenderness Neurological Exam: alert and oriented to person, place, and time. Attention span and concentration intact, recent and remote memory intact, fund of knowledge intact.  Speech fluent and not dysarthric, language intact.  CN II-XII intact. Bulk and tone normal, muscle strength 5/5 throughout.  Sensation to light touch  intact.   Deep tendon reflexes 2+ throughout.  Finger to nose testing intact.  Gait normal  IMPRESSION: Amaurosis fugax/ cryptogenic CVA  Bilateral carotid artery stenosis (progressed on the left since last year) Hypertension Type 2 diabetes mellitus Hyperlipidemia    PLAN: 1.  Continue ASA 81mg  daily 2.  Continue Lipitor 80mg  daily (LDL at goal less than 70) 3.  Continue blood pressure and glycemic control. 4.  Continue Mediterranean diet and exercise 5.  Repeat carotid doppler in one year and follow up soon afterwards.  26 minutes spent face to face with patient, over 50% spent reviewing labs, carotid doppler and management.  Metta Clines, DO  CC:  Kathlene November, MD

## 2016-06-03 DIAGNOSIS — H353131 Nonexudative age-related macular degeneration, bilateral, early dry stage: Secondary | ICD-10-CM | POA: Diagnosis not present

## 2016-06-03 DIAGNOSIS — H04123 Dry eye syndrome of bilateral lacrimal glands: Secondary | ICD-10-CM | POA: Diagnosis not present

## 2016-06-03 DIAGNOSIS — E119 Type 2 diabetes mellitus without complications: Secondary | ICD-10-CM | POA: Diagnosis not present

## 2016-06-03 DIAGNOSIS — H25813 Combined forms of age-related cataract, bilateral: Secondary | ICD-10-CM | POA: Diagnosis not present

## 2016-06-03 DIAGNOSIS — H524 Presbyopia: Secondary | ICD-10-CM | POA: Diagnosis not present

## 2016-06-10 ENCOUNTER — Other Ambulatory Visit: Payer: Self-pay

## 2016-06-10 MED ORDER — BD LANCET ULTRAFINE 33G MISC: 12 refills | Status: DC

## 2016-06-10 MED ORDER — METOPROLOL TARTRATE 50 MG PO TABS: 75.0000 mg | ORAL_TABLET | Freq: Two times a day (BID) | ORAL | 1 refills | Status: DC

## 2016-07-01 ENCOUNTER — Other Ambulatory Visit: Payer: Self-pay | Admitting: Internal Medicine

## 2016-07-08 ENCOUNTER — Encounter: Payer: Self-pay | Admitting: Internal Medicine

## 2016-07-08 ENCOUNTER — Ambulatory Visit (INDEPENDENT_AMBULATORY_CARE_PROVIDER_SITE_OTHER): Payer: Medicare Other | Admitting: Internal Medicine

## 2016-07-08 VITALS — BP 126/72 | HR 58 | Temp 97.9°F | Resp 14 | Ht 63.0 in | Wt 203.1 lb

## 2016-07-08 DIAGNOSIS — Z794 Long term (current) use of insulin: Secondary | ICD-10-CM

## 2016-07-08 DIAGNOSIS — E119 Type 2 diabetes mellitus without complications: Secondary | ICD-10-CM | POA: Diagnosis not present

## 2016-07-08 DIAGNOSIS — I10 Essential (primary) hypertension: Secondary | ICD-10-CM

## 2016-07-08 LAB — HEMOGLOBIN A1C: HEMOGLOBIN A1C: 6.5 % (ref 4.6–6.5)

## 2016-07-08 LAB — BASIC METABOLIC PANEL
BUN: 17 mg/dL (ref 6–23)
CO2: 33 mEq/L — ABNORMAL HIGH (ref 19–32)
Calcium: 9.7 mg/dL (ref 8.4–10.5)
Chloride: 103 mEq/L (ref 96–112)
Creatinine, Ser: 0.77 mg/dL (ref 0.40–1.20)
GFR: 78.81 mL/min (ref >60.00)
Glucose, Bld: 109 mg/dL — ABNORMAL HIGH (ref 70–99)
POTASSIUM: 4.1 meq/L (ref 3.5–5.1)
SODIUM: 141 meq/L (ref 135–145)

## 2016-07-08 NOTE — Patient Instructions (Addendum)
GO TO THE LAB : Get the blood work     GO TO THE FRONT DESK Schedule your next appointment for a   checkup in 4-5 months    Continue checking your blood pressure 3 or 4 times a week. If your blood pressure is consistently more than 140 please let us know

## 2016-07-08 NOTE — Progress Notes (Signed)
Pre visit review using our clinic review tool, if applicable. No additional management support is needed unless otherwise documented below in the visit note. 

## 2016-07-08 NOTE — Progress Notes (Signed)
Subjective:    Patient ID: Casey Bryan, female    DOB: 09/29/1945, 71 y.o.   MRN: [id number]  DOS:  07/08/2016 Type of visit - description : rov Interval history: DM: Good med  compliance, ambulatory CBGs ranged from 105 to 140. HTN: Good med compliance, BPs sometimes in the low 140s.  GERD: Good med compliance, no apparent side effects. She again reports that she is fatigued, states that she "works very hard". Take care of the grandkids,  from 2 to 6 children visit her most days, after they leave her house, she stays up  until  9:30 PM "cleaning the house".  Review of Systems Denies chest pain or difficulty breathing Lower extremity edema at baseline. No headaches, fever, chills. No unusual arthralgias.   Past Medical History:  Diagnosis Date  . Amaurosis fugax 12/12/2015  . Diabetes mellitus   . GERD (gastroesophageal reflux disease)   . Glaucoma 04-2012  . Hyperlipemia   . Hypertension     Past Surgical History:  Procedure Laterality Date  . NO PAST SURGERIES      Social History   Social History  . Marital status: Married    Spouse name: N/A  . Number of children: 3  . Years of education: N/A   Occupational History  . semi-retired,own a busines, accountant    Social History Main Topics  . Smoking status: Never Smoker  . Smokeless tobacco: Never Used  . Alcohol use Yes     Comment: socially  . Drug use: No  . Sexual activity: Not on file   Other Topics Concern  . Not on file   Social History Narrative   Original [place]       Allergies as of 07/08/2016      Reactions   Prevnar [pneumococcal 13-val Conj Vacc] Nausea And Vomiting, Other (See Comments)   Fever of 102.    Clarithromycin [clarithromycin]    Contradictions w/Vytorin   Penicillins    Tetanus-diphtheria Toxoids Td    REACTION: see OV note  02-11-10      Medication List       Accurate as of 07/08/16 11:59 PM. Always use your most recent med list.          [place] by Does not apply route. Reported on 04/22/2015   aspirin EC 81 MG tablet Take 81 mg by mouth daily.   atorvastatin 80 MG tablet Commonly known as:  LIPITOR Take 1 tablet (80 mg total) by mouth daily.   B-D ULTRA-FINE 33 LANCETS Misc Use to check blood sugars no more than twice daily   CALCIUM 1200 PO Take 1,000 mg by mouth.   EYE VITAMINS Caps Take 1 capsule by mouth daily.   glucose blood test strip Commonly known as:  ONE TOUCH ULTRA TEST Check blood sugar no more than twice daily   lidocaine 5 % ointment Commonly known as:  XYLOCAINE Apply 1 application topically 2 (two) times daily as needed.   losartan-hydrochlorothiazide 100-25 MG tablet Commonly known as:  HYZAAR Take 1 tablet by mouth daily.   metFORMIN 1000 MG tablet Commonly known as:  GLUCOPHAGE Take 1 tablet (1,000 mg total) by mouth daily with breakfast.   metoprolol 50 MG tablet Commonly known as:  LOPRESSOR Take 1.5 tablets (75 mg total) by mouth 2 (two) times daily.   multivitamin tablet Take 1 tablet by mouth daily.   NEXIUM 40 MG capsule Generic drug:  esomeprazole Take 1 capsule (40 mg total)  by mouth daily.   ranitidine 150 MG capsule Commonly known as:  ZANTAC Take 150 mg by mouth daily as needed.          Objective:   Physical Exam BP 126/72 (BP Location: Left Arm, Patient Position: Sitting, Cuff Size: Normal)   Pulse (!) 58   Temp 97.9 F (36.6 C) (Oral)   Resp 14   Ht 5\' 3"  (1.6 m)   Wt 203 lb 2 oz (92.1 kg)   SpO2 97%   BMI 35.98 kg/m  General:   Well developed, well nourished . NAD.  HEENT:  Normocephalic . Face symmetric, atraumatic Lungs:  CTA B Normal respiratory effort, no intercostal retractions, no accessory muscle use. Heart: RRR,  no murmur.  Trace-minimal  pretibial edema bilaterally  Skin: Not pale. Not jaundice DIABETIC FEET EXAM: Normal pedal pulses bilaterally Skin normal, nails normal, no calluses Pinprick examination of the feet  normal. Neurologic:  alert & oriented X3.  Speech normal, gait appropriate for age and unassisted Psych--  Cognition and judgment appear intact.  Cooperative with normal attention span and concentration.  Behavior appropriate. No anxious or depressed appearing.      Assessment & Plan:   Assessment: DM HTN (intolerant amlodipine 11-2015.) Hyperlipidemia Glaucoma dx 2014, cataracts NEURO: L amaurosix fugax 03-2015 -- saw opthalmology, started ASA  (02-2015); echo 04-09-15: nl EF, grade 2 Diast disfx -- saw neuro: Probable cryptogenic  --CVA  --Headaches: Saw neurology 2015 CV: Carotid artery disease:   [place] 04-2015: Progression of the right ICA now 40-59%. Stable left ICA 1-39%. Gastri polyp removal 08-2015, Dr Oletta Lamas  PLAN: DM: Continue metformin, check A1c; Feet exam normal today HTN: Ambulatory BPs sometimes in the 140s, BP today is great, continue Hyzaar, metoprolol, check a BMP. Call if BPs consistently more than 140. Fatigue: As described above, she seems appropriately fatigue, no worrisome sxs such as headache fever or chills. Recent labs show normal thyroid function and hemoglobin was satisfactory. Rec observation. RTC 4-5 months, routine checkup.

## 2016-07-09 NOTE — Assessment & Plan Note (Signed)
DM: Continue metformin, check A1c; Feet exam normal today HTN: Ambulatory BPs sometimes in the 140s, BP today is great, continue Hyzaar, metoprolol, check a BMP. Call if BPs consistently more than 140. Fatigue: As described above, she seems appropriately fatigue, no worrisome sxs such as headache fever or chills. Recent labs show normal thyroid function and hemoglobin was satisfactory. Rec observation. RTC 4-5 months, routine checkup.

## 2016-08-23 ENCOUNTER — Telehealth: Payer: Self-pay | Admitting: Internal Medicine

## 2016-08-23 NOTE — Telephone Encounter (Signed)
I talked to the patient is morningg when she was here with her husband, BPs are consistently more than 150 sometimes in the 180s. Recommend to continue the same medication and add amlodipine. Send a prescription for amlodipine 5 mg one tablet daily #30 and 3 refills. Tell pt the med will gradually help, call in 4-5 weeks, let me know how her BPs are. Watch for side effects mostly fluid accumulation. (Edema)

## 2016-08-23 NOTE — Telephone Encounter (Signed)
FYI

## 2016-08-23 NOTE — Telephone Encounter (Signed)
Relation to BO:MQTT Call back number:[phone]   Reason for call:  Patient spouse was seen today by Dr. Larose Kells and PCP advised to send telephone note, patient wanted to inform PCP blood pressure is ranging from last week until today from  180/90 , 157/75

## 2016-08-24 MED ORDER — AMLODIPINE BESYLATE 5 MG PO TABS: 5.0000 mg | ORAL_TABLET | Freq: Every day | ORAL | 3 refills | Status: DC

## 2016-08-24 NOTE — Telephone Encounter (Signed)
LMOM informing Pt of recommendations. Amlodipine sent to [facility] in [place], [place]. Instructed Pt to call if questions/concerns.

## 2016-08-30 ENCOUNTER — Telehealth: Payer: Self-pay | Admitting: Internal Medicine

## 2016-08-30 NOTE — Telephone Encounter (Signed)
Can you call to triage? Thank you.  

## 2016-08-30 NOTE — Telephone Encounter (Signed)
Need to know how BP was doing on amlodipine otherwise plan is: Stop amlodipine Increase metoprolol 50 mg from 1.5 to 2 tabs twice a day Monitor BPs and heart rate daily Call with readings in 10 days Low salt diet.

## 2016-08-30 NOTE — Telephone Encounter (Signed)
Spoke with the patient's daughter Christiana Fuchs) & she voiced that her mother started Amlodipine last week on 08/24/16 & since then has been experiencing various side effects from the medication. Per Christiana Fuchs, this is her mother's second time trying this medication and she has had the same reaction each time. Patient's daughter reported the following symptoms: body ache, fatigue & lightheadedness. Also, she has edema in her ankles/feet, but it was present prior to taking the medication. The patient is compliant with medication and regimen & has taken today's dose. Informed the daughter that RN will make PCP aware and call back with further recommendations.

## 2016-08-30 NOTE — Telephone Encounter (Signed)
Patient's daughter Christiana Fuchs) was made aware of the provider's recommendations below & verbalized understanding. She reported that her mother's blood pressure readings while taking the Amlodipine were 130's-140/60's. Routing message to PCP for review.

## 2016-08-30 NOTE — Telephone Encounter (Signed)
Thank you, amlodipine was working, we'll see how she does with more metoprolol, we can always consider at different CCB like Procardia extended release

## 2016-08-30 NOTE — Telephone Encounter (Signed)
Pt's daughter called in because mom is having a reaction to the new bp medication that PCP prescribed. She says that mom has started experiencing some fatigue and light headiness. They would like to be advised on if she should continue to take medication or what she should do?     CB: [phone]- Daughter

## 2016-09-01 ENCOUNTER — Other Ambulatory Visit: Payer: Self-pay | Admitting: Internal Medicine

## 2016-09-27 ENCOUNTER — Other Ambulatory Visit: Payer: Self-pay | Admitting: Internal Medicine

## 2016-11-04 ENCOUNTER — Other Ambulatory Visit: Payer: Self-pay | Admitting: Internal Medicine

## 2016-11-08 ENCOUNTER — Ambulatory Visit
Admission: RE | Admit: 2016-11-08 | Discharge: 2016-11-08 | Disposition: A | Discharge status: Home / Self Care | Payer: Medicare Other | Source: Ambulatory Visit | Attending: Internal Medicine | Admitting: Internal Medicine

## 2016-11-08 ENCOUNTER — Other Ambulatory Visit: Payer: Self-pay | Admitting: Internal Medicine

## 2016-11-08 DIAGNOSIS — Z1231 Encounter for screening mammogram for malignant neoplasm of breast: Secondary | ICD-10-CM

## 2016-11-26 ENCOUNTER — Other Ambulatory Visit: Payer: Self-pay | Admitting: Internal Medicine

## 2016-11-26 ENCOUNTER — Other Ambulatory Visit: Payer: Self-pay

## 2016-11-26 MED ORDER — METOPROLOL TARTRATE 50 MG PO TABS: 100.0000 mg | ORAL_TABLET | Freq: Two times a day (BID) | ORAL | 1 refills | Status: DC

## 2016-12-02 DIAGNOSIS — H25813 Combined forms of age-related cataract, bilateral: Secondary | ICD-10-CM | POA: Diagnosis not present

## 2016-12-02 DIAGNOSIS — H524 Presbyopia: Secondary | ICD-10-CM | POA: Diagnosis not present

## 2016-12-02 DIAGNOSIS — H5213 Myopia, bilateral: Secondary | ICD-10-CM | POA: Diagnosis not present

## 2016-12-02 DIAGNOSIS — H04123 Dry eye syndrome of bilateral lacrimal glands: Secondary | ICD-10-CM | POA: Diagnosis not present

## 2016-12-02 DIAGNOSIS — H353131 Nonexudative age-related macular degeneration, bilateral, early dry stage: Secondary | ICD-10-CM | POA: Diagnosis not present

## 2016-12-02 DIAGNOSIS — E119 Type 2 diabetes mellitus without complications: Secondary | ICD-10-CM | POA: Diagnosis not present

## 2016-12-02 DIAGNOSIS — H52223 Regular astigmatism, bilateral: Secondary | ICD-10-CM | POA: Diagnosis not present

## 2016-12-06 ENCOUNTER — Other Ambulatory Visit: Payer: Self-pay | Admitting: Internal Medicine

## 2016-12-07 ENCOUNTER — Ambulatory Visit: Payer: Medicare Other | Admitting: Internal Medicine

## 2016-12-14 ENCOUNTER — Ambulatory Visit: Payer: Medicare Other | Admitting: Internal Medicine

## 2016-12-15 ENCOUNTER — Other Ambulatory Visit: Payer: Self-pay

## 2016-12-15 MED ORDER — GLUCOSE BLOOD VI STRP: ORAL_STRIP | 12 refills | Status: DC

## 2017-01-18 ENCOUNTER — Ambulatory Visit (INDEPENDENT_AMBULATORY_CARE_PROVIDER_SITE_OTHER): Payer: Medicare Other | Admitting: Internal Medicine

## 2017-01-18 ENCOUNTER — Encounter: Payer: Self-pay | Admitting: Internal Medicine

## 2017-01-18 VITALS — BP 134/52 | HR 53 | Temp 98.1°F | Resp 16 | Ht 68.0 in | Wt 212.4 lb

## 2017-01-18 DIAGNOSIS — E119 Type 2 diabetes mellitus without complications: Secondary | ICD-10-CM | POA: Diagnosis not present

## 2017-01-18 DIAGNOSIS — Z0189 Encounter for other specified special examinations: Secondary | ICD-10-CM

## 2017-01-18 DIAGNOSIS — E559 Vitamin D deficiency, unspecified: Secondary | ICD-10-CM | POA: Diagnosis not present

## 2017-01-18 DIAGNOSIS — I10 Essential (primary) hypertension: Secondary | ICD-10-CM

## 2017-01-18 DIAGNOSIS — R51 Headache: Secondary | ICD-10-CM

## 2017-01-18 DIAGNOSIS — Z794 Long term (current) use of insulin: Secondary | ICD-10-CM

## 2017-01-18 DIAGNOSIS — R5383 Other fatigue: Secondary | ICD-10-CM

## 2017-01-18 DIAGNOSIS — R519 Headache, unspecified: Secondary | ICD-10-CM

## 2017-01-18 DIAGNOSIS — D649 Anemia, unspecified: Secondary | ICD-10-CM | POA: Diagnosis not present

## 2017-01-18 DIAGNOSIS — Z23 Encounter for immunization: Secondary | ICD-10-CM | POA: Diagnosis not present

## 2017-01-18 NOTE — Patient Instructions (Addendum)
GO TO THE LAB : Get the blood work     GO TO THE FRONT DESK Schedule a nurse visit in 2 weeks for a BP check  Schedule your next appointment for a  checkup in 2 months   Check your blood pressure twice a week with a large cuff, after 10 or 15 minutes of resting Be sure your blood pressure is between 110/65 and  15/85. If it is consistently higher or lower, let me know

## 2017-01-18 NOTE — Progress Notes (Signed)
Pre visit review using our clinic review tool, if applicable. No additional management support is needed unless otherwise documented below in the visit note. 

## 2017-01-18 NOTE — Progress Notes (Signed)
Subjective:    Patient ID: Casey Bryan, female    DOB: [birth year], 71 y.o.   MRN: [id number]  DOS:  01/18/2017 Type of visit - description : rov Interval history: HTN: Good compliance of medication, ambulatory BPs checked daily w/ and arm (not wrist) cuff; they are usually elevated up to 175/89. Headache: Ongoing problem, 2 times a week have a severe headache. Fatigue: Ongoing problem, very tired at the end of the day, this is going on for more than a year. DM: Good compliance of medication, ambulatory CBGs less than 145  BP Readings from Last 3 Encounters:  01/18/17 (!) 134/52  07/08/16 126/72  03/31/16 130/80     Review of Systems Denies chest pain or difficulty breathing Admits to snoring Denies anxiety or depression  Past Medical History:  Diagnosis Date  . Amaurosis fugax 12/12/2015  . Diabetes mellitus   . GERD (gastroesophageal reflux disease)   . Glaucoma 04-2012  . Hyperlipemia   . Hypertension     Past Surgical History:  Procedure Laterality Date  . NO PAST SURGERIES      Social History   Social History  . Marital status: Married    Spouse name: N/A  . Number of children: 3  . Years of education: N/A   Occupational History  . semi-retired,own a busines, accountant    Social History Main Topics  . Smoking status: Never Smoker  . Smokeless tobacco: Never Used  . Alcohol use Yes     Comment: socially  . Drug use: No  . Sexual activity: Not on file   Other Topics Concern  . Not on file   Social History Narrative   Original [place]       Allergies as of 01/18/2017      Reactions   Prevnar [pneumococcal 13-val Conj Vacc] Nausea And Vomiting, Other (See Comments)   Fever of 102.    Clarithromycin [clarithromycin]    Contradictions w/Vytorin   Penicillins    Tetanus-diphtheria Toxoids Td    REACTION: see OV note  02-11-10      Medication List       Accurate as of 01/18/17 11:59 PM. Always use your most recent med list.            [place] by Does not apply route. Reported on 04/22/2015   amLODipine 5 MG tablet Commonly known as:  NORVASC Take 1 tablet (5 mg total) by mouth daily.   aspirin EC 81 MG tablet Take 81 mg by mouth daily.   atorvastatin 80 MG tablet Commonly known as:  LIPITOR Take 1 tablet (80 mg total) by mouth daily.   B-D ULTRA-FINE 33 LANCETS Misc Use to check blood sugars no more than twice daily   CALCIUM 1200 PO Take 1,000 mg by mouth.   EYE VITAMINS Caps Take 1 capsule by mouth daily.   glucose blood test strip Commonly known as:  ONE TOUCH ULTRA TEST Check blood sugar no more than twice daily   lidocaine 5 % ointment Commonly known as:  XYLOCAINE Apply 1 application topically 2 (two) times daily as needed.   losartan-hydrochlorothiazide 100-25 MG tablet Commonly known as:  HYZAAR Take 1 tablet by mouth daily.   metFORMIN 1000 MG tablet Commonly known as:  GLUCOPHAGE Take 1 tablet (1,000 mg total) by mouth daily with breakfast.   metoprolol tartrate 50 MG tablet Commonly known as:  LOPRESSOR Take 2 tablets (100 mg total) by mouth 2 (two) times daily.  multivitamin tablet Take 1 tablet by mouth daily.   NEXIUM 40 MG capsule Generic drug:  esomeprazole Take 1 capsule (40 mg total) by mouth daily.   ranitidine 150 MG capsule Commonly known as:  ZANTAC Take 150 mg by mouth daily as needed.          Objective:   Physical Exam BP (!) 134/52   Pulse (!) 53   Temp 98.1 F (36.7 C) (Oral)   Resp 16   Ht 5\' 8"  (1.727 m)   Wt 212 lb 6.4 oz (96.3 kg)   SpO2 98%   BMI 32.30 kg/m  General:   Well developed, obese appearing, no distress HEENT:  Normocephalic . Face symmetric, atraumatic Neck: No thyromegaly Lungs:  CTA B Normal respiratory effort, no intercostal retractions, no accessory muscle use. Heart: RRR,  no murmur.  No pretibial edema bilaterally  Skin: Not pale. Not jaundice Neurologic:  alert & oriented X3.  Speech normal, gait  appropriate for age and unassisted Psych--  Cognition and judgment appear intact.  Cooperative with normal attention span and concentration.  Behavior appropriate. No anxious or depressed appearing.      Assessment & Plan:   Assessment: DM HTN (intolerant amlodipine 11-2015.) Hyperlipidemia Glaucoma dx 2014, cataracts NEURO: L amaurosix fugax 03-2015 -- saw opthalmology, started ASA  (02-2015); echo 04-09-15: nl EF, grade 2 Diast disfx -- saw neuro: Probable cryptogenic  --CVA  --Headaches: Saw neurology 2015 CV: Carotid artery disease:   [place] 04-2015: Progression of the right ICA now 40-59%. Stable left ICA 1-39%. GI: -GERD -Gastri polyp removal 08-2015, Dr Oletta Lamas  PLAN: DM: Continue metformin, check A1c HTN: BPs in the office always good, elevated at home. Will check a CMP, continue amlodipine, Hyzaar, metoprolol. Nurse visit in 2 weeks for BP check. Recommend to use a large cuff and check after resting for 10 minutes Fatigue: Ongoing problem, on chart review she has been complaining about fatigue on and off for a while. She does snore, Epworth scale scored 6, negative  Plan: CBC (had some anemia before) Folic acid, W26, vitamin D. Refer her for sleep study giving chronic fatigue, snoring and obesity (despite neg screening I'm still suspicious). H/o vitamin D deficiency: Checking labs, see above. Takes otc 600 u qd Headaches: Persisting, reports "severe" headaches twice a week. To see neuro in few weeks, :`nonc(ens.ee sooner RTC 2 months to reassess fatigue, fasting.

## 2017-01-19 LAB — FOLATE: Folate: 22.8 ng/mL (ref >5.9)

## 2017-01-19 LAB — CBC WITH DIFFERENTIAL/PLATELET
BASOS PCT: 0.8 % (ref 0.0–3.0)
Basophils Absolute: 0 10*3/uL (ref 0.0–0.1)
EOS PCT: 1.9 % (ref 0.0–5.0)
Eosinophils Absolute: 0.1 10*3/uL (ref 0.0–0.7)
HCT: 32.4 % — ABNORMAL LOW (ref 36.0–46.0)
HEMOGLOBIN: 10.8 g/dL — AB (ref 12.0–15.0)
LYMPHS ABS: 1.8 10*3/uL (ref 0.7–4.0)
Lymphocytes Relative: 34.8 % (ref 12.0–46.0)
MCHC: 33.2 g/dL (ref 30.0–36.0)
MCV: 84.9 fl (ref 78.0–100.0)
MONO ABS: 0.3 10*3/uL (ref 0.1–1.0)
Monocytes Relative: 6.3 % (ref 3.0–12.0)
Neutro Abs: 2.9 10*3/uL (ref 1.4–7.7)
Neutrophils Relative %: 56.2 % (ref 43.0–77.0)
Platelets: 186 10*3/uL (ref 150.0–400.0)
RBC: 3.82 Mil/uL — AB (ref 3.87–5.11)
RDW: 14.7 % (ref 11.5–15.5)
WBC: 5.1 10*3/uL (ref 4.0–10.5)

## 2017-01-19 LAB — COMPREHENSIVE METABOLIC PANEL
ALK PHOS: 47 U/L (ref 39–117)
ALT: 12 U/L (ref 0–35)
AST: 15 U/L (ref 0–37)
Albumin: 3.9 g/dL (ref 3.5–5.2)
BUN: 17 mg/dL (ref 6–23)
CHLORIDE: 98 meq/L (ref 96–112)
CO2: 32 mEq/L (ref 19–32)
Calcium: 9.3 mg/dL (ref 8.4–10.5)
Creatinine, Ser: 0.7 mg/dL (ref 0.40–1.20)
GFR: 87.84 mL/min (ref >60.00)
GLUCOSE: 84 mg/dL (ref 70–99)
POTASSIUM: 3.8 meq/L (ref 3.5–5.1)
SODIUM: 137 meq/L (ref 135–145)
Total Bilirubin: 0.5 mg/dL (ref 0.2–1.2)
Total Protein: 6.9 g/dL (ref 6.0–8.3)

## 2017-01-19 LAB — HEMOGLOBIN A1C: HEMOGLOBIN A1C: 6.5 % (ref 4.6–6.5)

## 2017-01-19 LAB — VITAMIN B12: Vitamin B-12: 379 pg/mL (ref 211–911)

## 2017-01-19 NOTE — Assessment & Plan Note (Signed)
DM: Continue metformin, check A1c HTN: BPs in the office always good, elevated at home. Will check a CMP, continue amlodipine, Hyzaar, metoprolol. Nurse visit in 2 weeks for BP check. Recommend to use a large cuff and check after resting for 10 minutes Fatigue: Ongoing problem, on chart review she has been complaining about fatigue on and off for a while. She does snore, Epworth scale scored 6, negative  Plan: CBC (had some anemia before) Folic acid, B02, vitamin D. Refer her for sleep study giving chronic fatigue, snoring and obesity (despite neg screening I'm still suspicious). H/o vitamin D deficiency: Checking labs, see above. Takes otc 600 u qd Headaches: Persisting, reports "severe" headaches twice a week. To see neuro in few weeks, :`nonc(ens.ee sooner RTC 2 months to reassess fatigue, fasting.

## 2017-01-20 NOTE — Addendum Note (Signed)
Addended by: Damita Dunnings D on: 01/20/2017 07:33 AM   Modules accepted: Orders

## 2017-01-21 LAB — VITAMIN D 1,25 DIHYDROXY
VITAMIN D 1, 25 (OH) TOTAL: 46 pg/mL (ref 18–72)
VITAMIN D3 1, 25 (OH): 46 pg/mL
Vitamin D2 1, 25 (OH)2: <8 pg/mL

## 2017-02-01 ENCOUNTER — Ambulatory Visit (INDEPENDENT_AMBULATORY_CARE_PROVIDER_SITE_OTHER): Payer: Medicare Other | Admitting: Internal Medicine

## 2017-02-01 ENCOUNTER — Encounter: Payer: Self-pay | Admitting: Internal Medicine

## 2017-02-01 VITALS — BP 125/73 | HR 56

## 2017-02-01 DIAGNOSIS — I10 Essential (primary) hypertension: Secondary | ICD-10-CM

## 2017-02-01 NOTE — Progress Notes (Addendum)
Pre visit review using our clinic tool,if applicable. No additional management support is needed unless otherwise documented below in the visit note.    Patient in for BP check per order from Dr. Kathlene November, MD dated 01/18/17.  Patient has no complaints voiced today.  BP = 125/73 P= 56  Per Dr. Larose Kells patient to continue medications as ordered and return for follow up visit in 2 months. Appointment scheduled.  Kathlene November, MD

## 2017-02-04 ENCOUNTER — Other Ambulatory Visit: Payer: Self-pay | Admitting: Internal Medicine

## 2017-02-09 ENCOUNTER — Ambulatory Visit (INDEPENDENT_AMBULATORY_CARE_PROVIDER_SITE_OTHER): Payer: Medicare Other | Admitting: Internal Medicine

## 2017-02-09 ENCOUNTER — Encounter: Payer: Self-pay | Admitting: Internal Medicine

## 2017-02-09 VITALS — BP 130/64 | HR 60 | Temp 98.2°F | Resp 12 | Wt 211.5 lb

## 2017-02-09 DIAGNOSIS — I10 Essential (primary) hypertension: Secondary | ICD-10-CM | POA: Diagnosis not present

## 2017-02-09 DIAGNOSIS — J069 Acute upper respiratory infection, unspecified: Secondary | ICD-10-CM | POA: Diagnosis not present

## 2017-02-09 DIAGNOSIS — J029 Acute pharyngitis, unspecified: Secondary | ICD-10-CM | POA: Diagnosis not present

## 2017-02-09 LAB — POCT RAPID STREP A (OFFICE): RAPID STREP A SCREEN: NEGATIVE

## 2017-02-09 NOTE — Patient Instructions (Addendum)
Rest, fluids , tylenol  For cough:  Take Mucinex DM twice a day as needed until better  For nasal congestion: Use OTC Nasocort or Flonase : 2 nasal sprays on each side of the nose in the morning until you feel better  Avoid decongestants such as  Pseudoephedrine or phenylephrine   Call if not gradually better over the next  10 days  Call anytime if the symptoms are severe

## 2017-02-09 NOTE — Progress Notes (Signed)
Subjective:    Patient ID: Casey Bryan, female    DOB: 07-19-1945, 71 y.o.   MRN: [id number]  DOS:  02/09/2017 Type of visit - description : acute Interval history: Two days h/o cough, sore throat, itchy throat. Some nasal congestion Patient is concerned because her grandson has pneumonia   Review of Systems Denies fevers, chest pain or difficulty breathing No nausea, vomiting, diarrhea No sputum production or wheezing.   Past Medical History:  Diagnosis Date  . Amaurosis fugax 12/12/2015  . Diabetes mellitus   . GERD (gastroesophageal reflux disease)   . Glaucoma 04-2012  . Hyperlipemia   . Hypertension     Past Surgical History:  Procedure Laterality Date  . NO PAST SURGERIES      Social History   Social History  . Marital status: Married    Spouse name: N/A  . Number of children: 3  . Years of education: N/A   Occupational History  . semi-retired,own a busines, accountant    Social History Main Topics  . Smoking status: Never Smoker  . Smokeless tobacco: Never Used  . Alcohol use Yes     Comment: socially  . Drug use: No  . Sexual activity: Not on file   Other Topics Concern  . Not on file   Social History Narrative   Original [place]       Allergies as of 02/09/2017      Reactions   Prevnar [pneumococcal 13-val Conj Vacc] Nausea And Vomiting, Other (See Comments)   Fever of 102.    Clarithromycin [clarithromycin]    Contradictions w/Vytorin   Penicillins    Tetanus-diphtheria Toxoids Td    REACTION: see OV note  02-11-10      Medication List       Accurate as of 02/09/17 11:59 PM. Always use your most recent med list.          [place] by Does not apply route. Reported on 04/22/2015   aspirin EC 81 MG tablet Take 81 mg by mouth daily.   atorvastatin 80 MG tablet Commonly known as:  LIPITOR Take 1 tablet (80 mg total) by mouth daily.   B-D ULTRA-FINE 33 LANCETS Misc Use to check blood sugars no more than  twice daily   CALCIUM 1200 PO Take 1,000 mg by mouth.   EYE VITAMINS Caps Take 1 capsule by mouth daily.   glucose blood test strip Commonly known as:  ONE TOUCH ULTRA TEST Check blood sugar no more than twice daily   lidocaine 5 % ointment Commonly known as:  XYLOCAINE Apply 1 application topically 2 (two) times daily as needed.   losartan-hydrochlorothiazide 100-25 MG tablet Commonly known as:  HYZAAR Take 1 tablet by mouth daily.   metFORMIN 1000 MG tablet Commonly known as:  GLUCOPHAGE Take 1 tablet (1,000 mg total) by mouth daily with breakfast.   metoprolol tartrate 50 MG tablet Commonly known as:  LOPRESSOR Take 2 tablets (100 mg total) by mouth 2 (two) times daily.   multivitamin tablet Take 1 tablet by mouth daily.   NEXIUM 40 MG capsule Generic drug:  esomeprazole Take 1 capsule (40 mg total) by mouth daily.   ranitidine 150 MG capsule Commonly known as:  ZANTAC Take 150 mg by mouth daily as needed.          Objective:   Physical Exam BP 130/64 (BP Location: Left Arm, Patient Position: Sitting, Cuff Size: Large)   Pulse 60   Temp 98.2  F (36.8 C) (Oral)   Resp 12   Wt 211 lb 8 oz (95.9 kg)   SpO2 98%   BMI 32.16 kg/m  General:   Well developed, well nourished . NAD.  HEENT:  Normocephalic . Face symmetric, atraumatic. TMs normal, nose is slightly congested, sinuses no TTP. Throat: Tonsils normal size, symmetric, throat in general is slightly red without discharge Lungs:  CTA B Normal respiratory effort, no intercostal retractions, no accessory muscle use. Heart: RRR,  no murmur.  No pretibial edema bilaterally  Skin: Not pale. Not jaundice Neurologic:  alert & oriented X3.  Speech normal, gait appropriate for age and unassisted Psych--  Cognition and judgment appear intact.  Cooperative with normal attention span and concentration.  Behavior appropriate. No anxious or depressed appearing.      Assessment & Plan:    Assessment: DM HTN (intolerant amlodipine 11-2015 and 2018) Hyperlipidemia Glaucoma dx 2014, cataracts NEURO: L amaurosix fugax 03-2015 -- saw opthalmology, started ASA  (02-2015); echo 04-09-15: nl EF, grade 2 Diast disfx -- saw neuro: Probable cryptogenic  --CVA  --Headaches: Saw neurology 2015 CV: Carotid artery disease:   [place] 04-2015: Progression of the right ICA now 40-59%. Stable left ICA 1-39%. GI: -GERD -Gastri polyp removal 08-2015, Dr Oletta Lamas  PLAN: URI: Rapid strep test negative, conservative treatment, see  instructions HTN: Today, she reports has not been taking amlodipine for several weeks due to feeling "drunk". She is on Hyzaar and metoprolol with good ambulatory BPs. BP today is very good. No change. RTC 03-2017 as schedule

## 2017-02-10 ENCOUNTER — Other Ambulatory Visit: Payer: Self-pay | Admitting: *Deleted

## 2017-02-10 DIAGNOSIS — I6523 Occlusion and stenosis of bilateral carotid arteries: Secondary | ICD-10-CM

## 2017-02-10 NOTE — Assessment & Plan Note (Signed)
URI: Rapid strep test negative, conservative treatment, see  instructions HTN: Today, she reports has not been taking amlodipine for several weeks due to feeling "drunk". She is on Hyzaar and metoprolol with good ambulatory BPs. BP today is very good. No change. RTC 03-2017 as schedule

## 2017-02-27 ENCOUNTER — Other Ambulatory Visit: Payer: Self-pay | Admitting: Internal Medicine

## 2017-03-02 ENCOUNTER — Encounter: Payer: Self-pay | Admitting: Neurology

## 2017-03-02 ENCOUNTER — Ambulatory Visit (INDEPENDENT_AMBULATORY_CARE_PROVIDER_SITE_OTHER): Payer: Medicare Other | Admitting: Neurology

## 2017-03-02 VITALS — BP 138/64 | HR 60 | Ht 64.0 in | Wt 211.0 lb

## 2017-03-02 DIAGNOSIS — I6523 Occlusion and stenosis of bilateral carotid arteries: Secondary | ICD-10-CM

## 2017-03-02 DIAGNOSIS — R51 Headache: Secondary | ICD-10-CM

## 2017-03-02 DIAGNOSIS — E119 Type 2 diabetes mellitus without complications: Secondary | ICD-10-CM | POA: Diagnosis not present

## 2017-03-02 DIAGNOSIS — R519 Headache, unspecified: Secondary | ICD-10-CM

## 2017-03-02 DIAGNOSIS — I10 Essential (primary) hypertension: Secondary | ICD-10-CM

## 2017-03-02 DIAGNOSIS — E785 Hyperlipidemia, unspecified: Secondary | ICD-10-CM | POA: Diagnosis not present

## 2017-03-02 DIAGNOSIS — Z794 Long term (current) use of insulin: Secondary | ICD-10-CM | POA: Diagnosis not present

## 2017-03-02 DIAGNOSIS — I639 Cerebral infarction, unspecified: Secondary | ICD-10-CM | POA: Diagnosis not present

## 2017-03-02 DIAGNOSIS — G453 Amaurosis fugax: Secondary | ICD-10-CM | POA: Diagnosis not present

## 2017-03-02 NOTE — Patient Instructions (Signed)
1.  We will check MRI of brain without contrast.  2.  For headaches:  Consider magnesium 400mg  daily, riboflavin 400mg  daily and CoQ10 100mg  three times daily.  Limit use of pain relievers (Tylenol) and caffeine (coffee) to no more than 2 days out of week to prevent rebound headahce  Increase water intake  Exercise 3.  We will check carotid doppler 4.  Continue aspirin, Lipitor and medication for blood pressure and diabetes 5.  Follow up in one year.  If headaches persist and you want to start a daily preventative, contact me and I will start you on something and then have you followo up sooner.

## 2017-03-02 NOTE — Progress Notes (Signed)
NEUROLOGY FOLLOW UP OFFICE NOTE  CYRIL RAILEY [id number]  HISTORY OF PRESENT ILLNESS: Casey Bryan is a 71 year old right-handed woman with hypertension, type II diabetes, GERD, glaucoma, and vitamin D deficiency who follows up for stroke like episode/left amaurosis fugax and now presents for headache.  She is accompanied by her daughter who supplements history.  UPDATE: For the past 4 months, she has been experiencing a new headache.  It is a mild to severe pressure-like occipital headache.  There is no neck pain.  There is associated photosensitivity but no nausea, vomiting, phonophobia or visual disturbance.  It lasts from 30 minutes to 2 hours.  It occurs 3 days a week.  She usually wakes up with the headache and improves later in the day, as movement and activity help relieve it.  Nothing specific triggers it.  She does not take pain reliever for it.  She hydrates with water and has a cup of coffee.  She denies excessive daytime sleepiness.  She usually sleeps okay.  She feels that her left leg is weak.  Sometimes when she stands on it, it feels like it is going to give out.  She is due for a repeat carotid doppler.   HISTORY: On 03/24/15, she had loss of vision in the left eye, lasting 20 to 30 minutes and then gradually improved.  She saw ophthalmology who suspected stroke or migraine.  She started ASA 81mg  daily.  Since then, she noted numbness and tingling off and on involving the right side of her body (face, arm and leg).  She denies weakness or recurrent visual disturbance.  2D echo showed normal EF of 60%-65% with grade 2 diastolic dysfunction.  Carotid doppler showed 40-59% right ICA stenosis, but 1-39% left ICA stenosis.  Sed Rate was 27.  Hgb A1c was 6.4%.  She had an MRI and MRA of the head performed on 05/04/15, which showed mild small vessel ischemic changes but no acute/subacute infarct or high-grade stenosis or occlusion of the intracranial vessels.  Carotid doppler from  03/18/16 demonstrated progression of stenosis, now with both ICAs 40-59%.  She was advised to continue ASA therapy, statin therapy and blood pressure and glycemic control.   PAST MEDICAL HISTORY: Past Medical History:  Diagnosis Date  . Amaurosis fugax 12/12/2015  . Diabetes mellitus   . GERD (gastroesophageal reflux disease)   . Glaucoma 04-2012  . Hyperlipemia   . Hypertension     MEDICATIONS: Current Outpatient Medications on File Prior to Visit  Medication Sig Dispense Refill  . aspirin EC 81 MG tablet Take 81 mg by mouth daily.    Marland Kitchen atorvastatin (LIPITOR) 80 MG tablet Take 1 tablet (80 mg total) by mouth daily. 90 tablet 2  . B-D ULTRA-FINE 33 LANCETS MISC Use to check blood sugars no more than twice daily 100 each 12  . Blood Glucose Monitoring Suppl (ADVOCATE REDI-CODE) [place] by Does not apply route. Reported on 04/22/2015    . Calcium Carbonate-Vit D-Min (CALCIUM 1200 PO) Take 1,000 mg by mouth.     Marland Kitchen glucose blood (ONE TOUCH ULTRA TEST) test strip Check blood sugar no more than twice daily 100 each 12  . lidocaine (XYLOCAINE) 5 % ointment Apply 1 application topically 2 (two) times daily as needed.     Marland Kitchen losartan-hydrochlorothiazide (HYZAAR) 100-25 MG tablet Take 1 tablet daily by mouth. 90 tablet 1  . metFORMIN (GLUCOPHAGE) 1000 MG tablet Take 1 tablet (1,000 mg total) by mouth daily with breakfast. 90  tablet 2  . metoprolol tartrate (LOPRESSOR) 50 MG tablet Take 2 tablets (100 mg total) by mouth 2 (two) times daily. 360 tablet 1  . Multiple Vitamin (MULTIVITAMIN) tablet Take 1 tablet by mouth daily.      . Multiple Vitamins-Minerals (EYE VITAMINS) CAPS Take 1 capsule by mouth daily.    Marland Kitchen NEXIUM 40 MG capsule Take 1 capsule (40 mg total) by mouth daily. 90 capsule 3  . ranitidine (ZANTAC) 150 MG capsule Take 150 mg by mouth daily as needed.       No current facility-administered medications on file prior to visit.     ALLERGIES: Allergies  Allergen Reactions  . Prevnar  [Pneumococcal 13-Val Conj Vacc] Nausea And Vomiting and Other (See Comments)    Fever of 102.   . Clarithromycin [Clarithromycin]     Contradictions w/Vytorin  . Penicillins   . Tetanus-Diphtheria Toxoids Td     REACTION: see OV note  02-11-10    FAMILY HISTORY: Family History  Problem Relation Age of Onset  . Diabetes Mother        uncles, mother , father   . Coronary artery disease Unknown        F age 61, B age 79, uncle age 71 : MI  . Heart attack Brother 38  . Breast cancer Sister   . Colon cancer Neg Hx     SOCIAL HISTORY: Social History   Socioeconomic History  . Marital status: Married    Spouse name: Not on file  . Number of children: 3  . Years of education: Not on file  . Highest education level: Not on file  Social Needs  . Financial resource strain: Not on file  . Food insecurity - worry: Not on file  . Food insecurity - inability: Not on file  . Transportation needs - medical: Not on file  . Transportation needs - non-medical: Not on file  Occupational History  . Occupation: semi-retired,own a busines, accountant  Tobacco Use  . Smoking status: Never Smoker  . Smokeless tobacco: Never Used  Substance and Sexual Activity  . Alcohol use: Yes    Comment: socially  . Drug use: No  . Sexual activity: Not on file  Other Topics Concern  . Not on file  Social History Narrative   Original [place]     REVIEW OF SYSTEMS: Constitutional: No fevers, chills, or sweats, no generalized fatigue, change in appetite Eyes: No visual changes, double vision, eye pain Ear, nose and throat: No hearing loss, ear pain, nasal congestion, sore throat Cardiovascular: No chest pain, palpitations Respiratory:  No shortness of breath at rest or with exertion, wheezes GastrointestinaI: No nausea, vomiting, diarrhea, abdominal pain, fecal incontinence Genitourinary:  No dysuria, urinary retention or frequency Musculoskeletal:  No neck pain, back pain Integumentary: No rash,  pruritus, skin lesions Neurological: as above Psychiatric: No depression, insomnia, anxiety Endocrine: No palpitations, fatigue, diaphoresis, mood swings, change in appetite, change in weight, increased thirst Hematologic/Lymphatic:  No purpura, petechiae. Allergic/Immunologic: no itchy/runny eyes, nasal congestion, recent allergic reactions, rashes  PHYSICAL EXAM: Vitals:   03/02/17 1136  BP: 138/64  Pulse: 60  SpO2: 97%   General: No acute distress.  Patient appears well-groomed.   Head:  Normocephalic/atraumatic Eyes:  Fundi examined but not visualized Neck: supple, no paraspinal tenderness, full range of motion Heart:  Regular rate and rhythm Lungs:  Clear to auscultation bilaterally Back: No paraspinal tenderness Neurological Exam: alert and oriented to person, place, and time. Attention span  and concentration intact, recent and remote memory intact, fund of knowledge intact.  Speech fluent and not dysarthric, language intact.  CN II-XII intact. Bulk and tone normal, muscle strength 5/5 throughout.  Sensation to light touch, temperature intact.  She endorses reduced vibration sensation in the left foot.  Deep tendon reflexes 2+ throughout, toes downgoing.  Finger to nose testing intact.  Gait normal, Romberg negative.  IMPRESSION: 1.  Occipital headaches, new onset 2.  Amaurosis fugax/ cryptogenic CVA  3.  Bilateral carotid artery stenosis (progressed on the left since last year) 4.  Hypertension 5.  Type 2 diabetes mellitus 6.  Hyperlipidemia 7.  She endorses mild left leg weakness.  Unclear etiology.  Weakness not appreciated on exam.  PLAN: 1.  Check MRI of brain to evaluate for secondary cause of new onset headache 2.  Consider starting a headache preventative such as nortriptyline or gabapentin.  She defers at this time. 3.  May take Tylenol for abortive therapy, limited to no more than 2 days out of the week. 4.  Check carotid doppler 5.  Continue ASA 81mg  daily 6   Continue Lipitor 80mg  daily (LDL at goal less than 70) as per Dr. Larose Kells 7.  Continue blood pressure and glycemic control as per Dr. Larose Kells 8.  Follow up in one year or sooner if headache does not improve and reconsiders starting a preventative.   Metta Clines, DO  CC:  Kathlene November, MD

## 2017-03-09 ENCOUNTER — Other Ambulatory Visit: Payer: Self-pay | Admitting: Cardiovascular Disease

## 2017-03-09 ENCOUNTER — Ambulatory Visit (HOSPITAL_COMMUNITY): Payer: Medicare Other | Attending: Cardiology

## 2017-03-09 DIAGNOSIS — I6523 Occlusion and stenosis of bilateral carotid arteries: Secondary | ICD-10-CM

## 2017-03-23 ENCOUNTER — Ambulatory Visit (INDEPENDENT_AMBULATORY_CARE_PROVIDER_SITE_OTHER): Payer: Medicare Other | Admitting: Internal Medicine

## 2017-03-23 ENCOUNTER — Encounter: Payer: Self-pay | Admitting: Internal Medicine

## 2017-03-23 VITALS — BP 122/62 | HR 54 | Temp 98.2°F | Resp 14 | Ht 64.0 in | Wt 211.4 lb

## 2017-03-23 DIAGNOSIS — I10 Essential (primary) hypertension: Secondary | ICD-10-CM | POA: Diagnosis not present

## 2017-03-23 DIAGNOSIS — Z794 Long term (current) use of insulin: Secondary | ICD-10-CM | POA: Diagnosis not present

## 2017-03-23 DIAGNOSIS — E119 Type 2 diabetes mellitus without complications: Secondary | ICD-10-CM

## 2017-03-23 DIAGNOSIS — I6523 Occlusion and stenosis of bilateral carotid arteries: Secondary | ICD-10-CM

## 2017-03-23 DIAGNOSIS — E785 Hyperlipidemia, unspecified: Secondary | ICD-10-CM

## 2017-03-23 NOTE — Progress Notes (Signed)
Pre visit review using our clinic review tool, if applicable. No additional management support is needed unless otherwise documented below in the visit note. 

## 2017-03-23 NOTE — Progress Notes (Signed)
Subjective:    Patient ID: Casey Bryan, female    DOB: Sep 01, 1945, 71 y.o.   MRN: [id number]  DOS:  03/23/2017 Type of visit - description : rov Interval history: HTN: Good compliance of medication, ambulatory BPs in the 146/80. DM: Ambulatory CBGs fasting around 110, 120. No other concerns  BP Readings from Last 3 Encounters:  03/23/17 122/62  03/02/17 138/64  02/09/17 130/64    Review of Systems No chest pain or difficulty breathing  Past Medical History:  Diagnosis Date  . Amaurosis fugax 12/12/2015  . Diabetes mellitus   . GERD (gastroesophageal reflux disease)   . Glaucoma 04-2012  . Hyperlipemia   . Hypertension     Past Surgical History:  Procedure Laterality Date  . NO PAST SURGERIES      Social History   Socioeconomic History  . Marital status: Married    Spouse name: Not on file  . Number of children: 3  . Years of education: Not on file  . Highest education level: Not on file  Social Needs  . Financial resource strain: Not on file  . Food insecurity - worry: Not on file  . Food insecurity - inability: Not on file  . Transportation needs - medical: Not on file  . Transportation needs - non-medical: Not on file  Occupational History  . Occupation: semi-retired,own a busines, accountant  Tobacco Use  . Smoking status: Never Smoker  . Smokeless tobacco: Never Used  Substance and Sexual Activity  . Alcohol use: Yes    Comment: socially  . Drug use: No  . Sexual activity: Not on file  Other Topics Concern  . Not on file  Social History Narrative   Original [place]       Allergies as of 03/23/2017      Reactions   Prevnar [pneumococcal 13-val Conj Vacc] Nausea And Vomiting, Other (See Comments)   Fever of 102.    Clarithromycin [clarithromycin]    Contradictions w/Vytorin   Penicillins    Tetanus-diphtheria Toxoids Td    REACTION: see OV note  02-11-10      Medication List        Accurate as of 03/23/17 11:59 PM. Always use your most  recent med list.          [place] by Does not apply route. Reported on 04/22/2015   aspirin EC 81 MG tablet Take 81 mg by mouth daily.   atorvastatin 80 MG tablet Commonly known as:  LIPITOR Take 1 tablet (80 mg total) by mouth daily.   B-D ULTRA-FINE 33 LANCETS Misc Use to check blood sugars no more than twice daily   CALCIUM 1200 PO Take 1,000 mg by mouth.   EYE VITAMINS Caps Take 1 capsule by mouth daily.   glucose blood test strip Commonly known as:  ONE TOUCH ULTRA TEST Check blood sugar no more than twice daily   lidocaine 5 % ointment Commonly known as:  XYLOCAINE Apply 1 application topically 2 (two) times daily as needed.   losartan-hydrochlorothiazide 100-25 MG tablet Commonly known as:  HYZAAR Take 1 tablet daily by mouth.   metFORMIN 1000 MG tablet Commonly known as:  GLUCOPHAGE Take 1 tablet (1,000 mg total) by mouth daily with breakfast.   metoprolol tartrate 50 MG tablet Commonly known as:  LOPRESSOR Take 2 tablets (100 mg total) by mouth 2 (two) times daily.   multivitamin tablet Take 1 tablet by mouth daily.   NEXIUM 40 MG capsule Generic  drug:  esomeprazole Take 1 capsule (40 mg total) by mouth daily.   ranitidine 150 MG capsule Commonly known as:  ZANTAC Take 150 mg by mouth daily as needed.          Objective:   Physical Exam BP 122/62 (BP Location: Left Arm, Patient Position: Sitting, Cuff Size: Normal)   Pulse (!) 54   Temp 98.2 F (36.8 C) (Oral)   Resp 14   Ht 5\' 4"  (1.626 m)   Wt 211 lb 6 oz (95.9 kg)   SpO2 97%   BMI 36.28 kg/m  General:   Well developed, well nourished . NAD.  HEENT:  Normocephalic . Face symmetric, atraumatic Lungs:  CTA B Normal respiratory effort, no intercostal retractions, no accessory muscle use. Heart: RRR,  no murmur.  No pretibial edema bilaterally  Skin: Not pale. Not jaundice Neurologic:  alert & oriented X3.  Speech normal, gait appropriate for age and  unassisted Psych--  Cognition and judgment appear intact.  Cooperative with normal attention span and concentration.  Behavior appropriate. No anxious or depressed appearing.      Assessment & Plan:   Assessment: DM HTN (intolerant amlodipine 11-2015 and 2018) Hyperlipidemia Glaucoma dx 2014, cataracts NEURO: L amaurosix fugax 03-2015 -- saw opthalmology, started ASA  (02-2015); echo 04-09-15: nl EF, grade 2 Diast disfx -- saw neuro: Probable cryptogenic  --CVA  --Headaches: Saw neurology 2015 CV: Carotid artery disease:   [place] 04-2015: Progression of the right ICA now 40-59%. Stable left ICA 1-39%. GI: -GERD -Gastri polyp removal 08-2015, Dr Oletta Lamas  PLAN: DM: Continue metformin, last A1c satisfactory. HTN: On Hyzaar, metoprolol.  BPs at the office very good, ambulatory BP is 140/80.  No change, continue monitoring BPs.  Last BMP satisfactory High cholesterol: Lipitor, AST ALT normal, check FLP. RTC 5-6 months, CPX

## 2017-03-23 NOTE — Patient Instructions (Signed)
GO TO THE LAB : Get the blood work     GO TO THE FRONT DESK Schedule your next appointment for a  Physical exam in 5-6 months.    Check the  blood pressure   Be sure your blood pressure is between 110/65 and  140/85. If it is consistently higher or lower, let me know

## 2017-03-24 LAB — LIPID PANEL
CHOL/HDL RATIO: 2
Cholesterol: 126 mg/dL (ref 0–200)
HDL: 50.7 mg/dL (ref >39.00)
LDL Cholesterol: 57 mg/dL (ref 0–99)
NONHDL: 75.06
Triglycerides: 88 mg/dL (ref 0.0–149.0)
VLDL: 17.6 mg/dL (ref 0.0–40.0)

## 2017-03-24 NOTE — Assessment & Plan Note (Signed)
DM: Continue metformin, last A1c satisfactory. HTN: On Hyzaar, metoprolol.  BPs at the office very good, ambulatory BP is 140/80.  No change, continue monitoring BPs.  Last BMP satisfactory High cholesterol: Lipitor, AST ALT normal, check FLP. RTC 5-6 months, CPX

## 2017-03-31 ENCOUNTER — Ambulatory Visit: Payer: Medicare Other | Admitting: Neurology

## 2017-04-04 ENCOUNTER — Ambulatory Visit: Payer: Medicare Other | Admitting: Internal Medicine

## 2017-04-04 ENCOUNTER — Telehealth: Payer: Self-pay

## 2017-04-04 DIAGNOSIS — I6523 Occlusion and stenosis of bilateral carotid arteries: Secondary | ICD-10-CM

## 2017-04-04 NOTE — Telephone Encounter (Signed)
Orders placed.

## 2017-04-04 NOTE — Telephone Encounter (Signed)
-----   Message from Colon Branch, MD sent at 04/02/2017  1:09 PM EST ----- Regarding: Carotid ultrasound Needs a carotid ultrasound, DX carotid artery dz To be done at [facility] as before Let pt know

## 2017-05-12 ENCOUNTER — Other Ambulatory Visit: Payer: Self-pay | Admitting: Internal Medicine

## 2017-06-06 ENCOUNTER — Other Ambulatory Visit: Payer: Self-pay | Admitting: Internal Medicine

## 2017-06-09 DIAGNOSIS — H04123 Dry eye syndrome of bilateral lacrimal glands: Secondary | ICD-10-CM | POA: Diagnosis not present

## 2017-06-09 DIAGNOSIS — H524 Presbyopia: Secondary | ICD-10-CM | POA: Diagnosis not present

## 2017-06-09 DIAGNOSIS — E119 Type 2 diabetes mellitus without complications: Secondary | ICD-10-CM | POA: Diagnosis not present

## 2017-06-09 DIAGNOSIS — H52223 Regular astigmatism, bilateral: Secondary | ICD-10-CM | POA: Diagnosis not present

## 2017-06-09 DIAGNOSIS — H25813 Combined forms of age-related cataract, bilateral: Secondary | ICD-10-CM | POA: Diagnosis not present

## 2017-06-09 DIAGNOSIS — H5213 Myopia, bilateral: Secondary | ICD-10-CM | POA: Diagnosis not present

## 2017-06-09 DIAGNOSIS — H353131 Nonexudative age-related macular degeneration, bilateral, early dry stage: Secondary | ICD-10-CM | POA: Diagnosis not present

## 2017-07-05 ENCOUNTER — Telehealth: Payer: Self-pay | Admitting: Internal Medicine

## 2017-07-05 NOTE — Telephone Encounter (Signed)
Copied from [facility]. Topic: Quick Communication - See Telephone Encounter >> Jul 05, 2017 10:30 AM Ahmed Prima L wrote: CRM for notification. See Telephone encounter for:   07/05/17.  Patient said she received a letter in the mail from her pharmacy that her losartan-hydrochlorothiazide (HYZAAR) 100-25 MG tablet. They told her to contact her pharmacy to see what she needs to do Call back [phone] (H)

## 2017-07-15 MED ORDER — LOSARTAN POTASSIUM-HCTZ 100-25 MG PO TABS: 1.0000 | ORAL_TABLET | Freq: Every day | ORAL | 1 refills | Status: DC

## 2017-07-15 NOTE — Addendum Note (Signed)
Addended byDamita Dunnings D on: 07/15/2017 04:53 PM   Modules accepted: Orders

## 2017-07-15 NOTE — Telephone Encounter (Signed)
Spoke w/ Pt- she informed that she received a letter that losartan-hctz has been recalled- she was informed by [facility] to keep taking supply. Informed most pharmacies changed suppliers once recall was announced. We agreed to try sending a new prescription to pharmacy and see if they can give new supply that is not recalled- she is to let me know Monday if not.

## 2017-07-15 NOTE — Telephone Encounter (Signed)
Daughter called- she never got a call back about  this issue - please call [phone]. If no answer, please call [phone] Sending high priority since no answer over 72 hrs

## 2017-07-15 NOTE — Telephone Encounter (Deleted)
Calling regarding recall? Requesting different medication?

## 2017-08-15 ENCOUNTER — Encounter: Payer: Self-pay | Admitting: Neurology

## 2017-08-31 ENCOUNTER — Other Ambulatory Visit: Payer: Self-pay | Admitting: Internal Medicine

## 2017-09-21 ENCOUNTER — Encounter: Payer: Self-pay | Admitting: Internal Medicine

## 2017-09-21 ENCOUNTER — Ambulatory Visit (INDEPENDENT_AMBULATORY_CARE_PROVIDER_SITE_OTHER): Payer: Medicare Other | Admitting: Internal Medicine

## 2017-09-21 VITALS — BP 140/70 | HR 54 | Temp 97.8°F | Resp 16 | Ht 64.0 in | Wt 213.0 lb

## 2017-09-21 DIAGNOSIS — Z794 Long term (current) use of insulin: Secondary | ICD-10-CM

## 2017-09-21 DIAGNOSIS — D649 Anemia, unspecified: Secondary | ICD-10-CM | POA: Diagnosis not present

## 2017-09-21 DIAGNOSIS — E119 Type 2 diabetes mellitus without complications: Secondary | ICD-10-CM

## 2017-09-21 DIAGNOSIS — E785 Hyperlipidemia, unspecified: Secondary | ICD-10-CM | POA: Diagnosis not present

## 2017-09-21 DIAGNOSIS — R5383 Other fatigue: Secondary | ICD-10-CM | POA: Diagnosis not present

## 2017-09-21 DIAGNOSIS — I10 Essential (primary) hypertension: Secondary | ICD-10-CM

## 2017-09-21 LAB — COMPREHENSIVE METABOLIC PANEL
ALBUMIN: 3.9 g/dL (ref 3.5–5.2)
ALK PHOS: 49 U/L (ref 39–117)
ALT: 11 U/L (ref 0–35)
AST: 15 U/L (ref 0–37)
BILIRUBIN TOTAL: 0.5 mg/dL (ref 0.2–1.2)
BUN: 13 mg/dL (ref 6–23)
CALCIUM: 9.2 mg/dL (ref 8.4–10.5)
CO2: 33 mEq/L — ABNORMAL HIGH (ref 19–32)
CREATININE: 0.6 mg/dL (ref 0.40–1.20)
Chloride: 100 mEq/L (ref 96–112)
GFR: 104.74 mL/min (ref >60.00)
Glucose, Bld: 115 mg/dL — ABNORMAL HIGH (ref 70–99)
Potassium: 3.6 mEq/L (ref 3.5–5.1)
Sodium: 139 mEq/L (ref 135–145)
TOTAL PROTEIN: 6.4 g/dL (ref 6.0–8.3)

## 2017-09-21 LAB — IRON: IRON: 47 ug/dL (ref 42–145)

## 2017-09-21 LAB — FERRITIN: Ferritin: 6.2 ng/mL — ABNORMAL LOW (ref 10.0–291.0)

## 2017-09-21 LAB — CBC
HCT: 31.8 % — ABNORMAL LOW (ref 36.0–46.0)
Hemoglobin: 10.5 g/dL — ABNORMAL LOW (ref 12.0–15.0)
MCHC: 33.2 g/dL (ref 30.0–36.0)
MCV: 81.6 fl (ref 78.0–100.0)
Platelets: 194 10*3/uL (ref 150.0–400.0)
RBC: 3.89 Mil/uL (ref 3.87–5.11)
RDW: 16.5 % — AB (ref 11.5–15.5)
WBC: 3.9 10*3/uL — AB (ref 4.0–10.5)

## 2017-09-21 LAB — HEMOGLOBIN A1C: HEMOGLOBIN A1C: 6.7 % — AB (ref 4.6–6.5)

## 2017-09-21 MED ORDER — OLMESARTAN MEDOXOMIL-HCTZ 40-25 MG PO TABS: 1.0000 | ORAL_TABLET | Freq: Every day | ORAL | 6 refills | Status: DC

## 2017-09-21 NOTE — Assessment & Plan Note (Addendum)
--  Td 2011; PNM 23: 2014; prevnar 2015 (had a reaction); shingrix not available - (+) FH CV disease - [facility]: Cscope Dr Oletta Lamas ~ 2008, normal, cscope again 12-2011, + polyps, cscope 08-2015, next per GI  -Female care: [facility] 10-2016.Per [facility].  Last Pap smear 2015, plans to see a gyn, never had an abnormal PAP, multiple PAPs beforee, likely won't need further screening.   Last DEXA 12-2015: T score -1.5

## 2017-09-21 NOTE — Assessment & Plan Note (Signed)
DM: Currently on metformin, check a A1c. HTN: Currently on metoprolol and Hyzaar.  Due to recalls, Hyzaar was changed twice,  likes to switch, recommend to change to olmesartan HCT 40/25 1 tablet daily, monitor BPs, BMP in 1 month. High cholesterol: Well-controlled on Lipitor Fatigue: Ongoing symptoms for years, no snoring, h/o normal vitamin levels, very mild anemia, unlikely to explain symptoms.  Suspect deconditioning, recommend gradual exercise program. Carotid artery disease: Due for a carotid ultrasound, patient prefers to wait until she sees neurology  Mild anemia, checking CBC, iron, ferritin Preventive care discussed  RTC 6 months

## 2017-09-21 NOTE — Progress Notes (Signed)
Subjective:    Patient ID: Casey Bryan, female    DOB: 08-07-1945, 72 y.o.   MRN: [id number]  DOS:  09/21/2017 Type of visit - description : Here for follow-up with her daughter Interval history:  DM: Good compliance with medications, ambulatory CBGs 110, 140. HTN: Good compliance with meds, ambulatory BPs occasionally elevated. Complaint of fatigue, this is going on for several years.  When asked, admits she is very sedentary.  Denies a snoring.  Review of Systems No chest pain or difficulty breathing.  No DOE.  Lower extremity edema at baseline. Denies headache, nausea, vomiting, diarrhea. No visual disturbances such as amaurosis fugax. She does have cataracts and glaucoma, so far declines surgery  Past Medical History:  Diagnosis Date  . Amaurosis fugax 12/12/2015  . Diabetes mellitus   . GERD (gastroesophageal reflux disease)   . Glaucoma 04-2012  . Hyperlipemia   . Hypertension     Past Surgical History:  Procedure Laterality Date  . NO PAST SURGERIES      Social History   Socioeconomic History  . Marital status: Married    Spouse name: Not on file  . Number of children: 3  . Years of education: Not on file  . Highest education level: Not on file  Occupational History  . Occupation: semi-retired,own a busines, Optometrist  Social Needs  . Financial resource strain: Not on file  . Food insecurity:    Worry: Not on file    Inability: Not on file  . Transportation needs:    Medical: Not on file    Non-medical: Not on file  Tobacco Use  . Smoking status: Never Smoker  . Smokeless tobacco: Never Used  Substance and Sexual Activity  . Alcohol use: Yes    Comment: socially  . Drug use: No  . Sexual activity: Not on file  Lifestyle  . Physical activity:    Days per week: Not on file    Minutes per session: Not on file  . Stress: Not on file  Relationships  . Social connections:    Talks on phone: Not on file    Gets together: Not on file    Attends  religious service: Not on file    Active member of club or organization: Not on file    Attends meetings of clubs or organizations: Not on file    Relationship status: Not on file  . Intimate partner violence:    Fear of current or ex partner: Not on file    Emotionally abused: Not on file    Physically abused: Not on file    Forced sexual activity: Not on file  Other Topics Concern  . Not on file  Social History Narrative   Original [place]       Allergies as of 09/21/2017      Reactions   Prevnar [pneumococcal 13-val Conj Vacc] Nausea And Vomiting, Other (See Comments)   Fever of 102.    Clarithromycin [clarithromycin]    Contradictions w/Vytorin   Penicillins    Tetanus-diphtheria Toxoids Td    REACTION: see OV note  02-11-10      Medication List        Accurate as of 09/21/17  6:26 PM. Always use your most recent med list.          [place] by Does not apply route. Reported on 04/22/2015   aspirin EC 81 MG tablet Take 81 mg by mouth daily.   atorvastatin 80 MG  tablet Commonly known as:  LIPITOR Take 1 tablet (80 mg total) by mouth daily.   CALCIUM 1200 PO Take 1,000 mg by mouth.   EYE VITAMINS Caps Take 1 capsule by mouth daily.   glucose blood test strip Commonly known as:  ONE TOUCH ULTRA TEST Check blood sugar no more than twice daily   lidocaine 5 % ointment Commonly known as:  XYLOCAINE Apply 1 application topically 2 (two) times daily as needed.   metFORMIN 1000 MG tablet Commonly known as:  GLUCOPHAGE Take 1 tablet (1,000 mg total) by mouth daily with breakfast.   metoprolol tartrate 50 MG tablet Commonly known as:  LOPRESSOR Take 2 tablets (100 mg total) by mouth 2 (two) times daily.   multivitamin tablet Take 1 tablet by mouth daily.   NEXIUM 40 MG capsule Generic drug:  esomeprazole Take 1 capsule (40 mg total) by mouth daily.   olmesartan-hydrochlorothiazide 40-25 MG tablet Commonly known as:  BENICAR HCT Take 1  tablet by mouth daily.   ONETOUCH DELICA LANCETS 75I Misc USE TO CHECK BLOOD SUGARS NO MORE THAN TWICE DAILY   ranitidine 150 MG capsule Commonly known as:  ZANTAC Take 150 mg by mouth daily as needed.          Objective:   Physical Exam BP 140/70 (BP Location: Left Arm, Patient Position: Sitting, Cuff Size: Large)   Pulse (!) 54   Temp 97.8 F (36.6 C) (Oral)   Resp 16   Ht 5\' 4"  (1.626 m)   Wt 213 lb (96.6 kg)   SpO2 97%   BMI 36.56 kg/m   General:   Well developed, morbidly obese appearing. NAD.  Neck: No  thyromegaly  HEENT:  Normocephalic . Face symmetric, atraumatic Lungs:  CTA B Normal respiratory effort, no intercostal retractions, no accessory muscle use. Heart: RRR,  no murmur.  Trace pretibial edema bilaterally  Abdomen:  Not distended, soft, non-tender. No rebound or rigidity.   Skin: Exposed areas without rash. Not pale. Not jaundice DIABETIC FEET EXAM: No lower extremity edema Normal pedal pulses bilaterally Skin normal, nails normal, no calluses Pinprick examination of the feet normal. Neurologic:  alert & oriented X3.  Speech normal, gait appropriate for age and unassisted.  Limited by BMI Strength symmetric and appropriate for age.  Psych: Cognition and judgment appear intact.  Cooperative with normal attention span and concentration.  Behavior appropriate. No anxious or depressed appearing.     Assessment & Plan:   Assessment: DM HTN (intolerant amlodipine 11-2015 and 2018) Hyperlipidemia Glaucoma dx 2014, cataracts NEURO: L amaurosix fugax 03-2015, CVA -- saw opthalmology, started ASA  (02-2015); echo 04-09-15: nl EF, grade 2 Diast disfx -- saw neuro: Probable cryptogenic  --Headaches: Sees  neurology   CV: Carotid artery disease:   [place] 04-2015: Progression of the right ICA now 40-59%. Stable left ICA 1-39%. GI: -GERD -Gastri polyp removal 08-2015, Dr Oletta Lamas  PLAN: DM: Currently on metformin, check a A1c. HTN: Currently on  metoprolol and Hyzaar.  Due to recalls, Hyzaar was changed twice,  likes to switch, recommend to change to olmesartan HCT 40/25 1 tablet daily, monitor BPs, BMP in 1 month. High cholesterol: Well-controlled on Lipitor Fatigue: Ongoing symptoms for years, no snoring, h/o normal vitamin levels, very mild anemia, unlikely to explain symptoms.  Suspect deconditioning, recommend gradual exercise program. Carotid artery disease: Due for a carotid ultrasound, patient prefers to wait until she sees neurology  Mild anemia, checking CBC, iron, ferritin Preventive care discussed  RTC  6 months

## 2017-09-21 NOTE — Patient Instructions (Addendum)
GO TO THE LAB : Get the blood work     GO TO THE FRONT DESK Schedule your next appointment for a checkup in 6 months Consider a Medicare wellness with one  of our nurses at your convenience  For hypertension: Stop losartan HCT  Start olmesartan HCT 40/25 1 tablet daily  Check the  blood pressure 2 or 3 times a week   Be sure your blood pressure is between 110/65 and  135/85. If it is consistently higher or lower, let me know   Please come back in 1 month just to recheck your potassium. Make an appointment

## 2017-09-26 ENCOUNTER — Other Ambulatory Visit: Payer: Self-pay

## 2017-09-26 DIAGNOSIS — D509 Iron deficiency anemia, unspecified: Secondary | ICD-10-CM

## 2017-09-26 NOTE — Progress Notes (Signed)
a 

## 2017-09-28 ENCOUNTER — Telehealth: Payer: Self-pay | Admitting: Internal Medicine

## 2017-09-28 DIAGNOSIS — I10 Essential (primary) hypertension: Secondary | ICD-10-CM

## 2017-09-28 MED ORDER — CANDESARTAN CILEXETIL-HCTZ 32-25 MG PO TABS: 1.0000 | ORAL_TABLET | Freq: Every day | ORAL | 5 refills | Status: DC

## 2017-09-28 MED ORDER — IRBESARTAN-HYDROCHLOROTHIAZIDE 300-12.5 MG PO TABS: 1.0000 | ORAL_TABLET | Freq: Every day | ORAL | 3 refills | Status: DC

## 2017-09-28 NOTE — Telephone Encounter (Signed)
Patient reported her pharmacy is having trouble finding Benicar HCT.  Please ask pharmacy regards alternatives.

## 2017-09-28 NOTE — Telephone Encounter (Signed)
Pt daughter Raj Janus calling stating that her mothers insurace want cover the Candesartan Cilexetil-HCTZ 32-25 MG TABS   Please give the pt back when this is handled

## 2017-09-28 NOTE — Telephone Encounter (Signed)
LMOM informing Pt to return call. Okay for PEC discuss change in medication.

## 2017-09-28 NOTE — Telephone Encounter (Signed)
Confirmed with pharmacy- Benicar HCTZ unavailable along with a lot of other ARB +HCTZ- they do have losartan-hctz 50-12.5mg .

## 2017-09-28 NOTE — Telephone Encounter (Signed)
Please advise 

## 2017-09-28 NOTE — Addendum Note (Signed)
Addended byDamita Dunnings D on: 09/28/2017 03:01 PM   Modules accepted: Orders

## 2017-09-28 NOTE — Telephone Encounter (Signed)
Candesartan-hctz 32/25mg  sent to [facility]. Will inform Pt shortly.

## 2017-09-28 NOTE — Telephone Encounter (Signed)
Pt has BMP lab appt scheduled for 11/01/2017.

## 2017-09-28 NOTE — Telephone Encounter (Signed)
pharmacy contacted, candesartan HCT d/c, we agreed on Irbersartan HCT 300/12.5.  Prescription sent. Needs BMP in 2-3 weeks

## 2017-09-28 NOTE — Telephone Encounter (Signed)
Okay, try candesartan HCT 32/25 1 tablet daily. Please advise patient about the change, asked her to be careful not take two similar blood pressure medications

## 2017-10-17 DIAGNOSIS — K648 Other hemorrhoids: Secondary | ICD-10-CM | POA: Diagnosis not present

## 2017-10-17 DIAGNOSIS — R1013 Epigastric pain: Secondary | ICD-10-CM | POA: Diagnosis not present

## 2017-10-17 DIAGNOSIS — Z8719 Personal history of other diseases of the digestive system: Secondary | ICD-10-CM | POA: Diagnosis not present

## 2017-10-17 DIAGNOSIS — D509 Iron deficiency anemia, unspecified: Secondary | ICD-10-CM | POA: Diagnosis not present

## 2017-10-19 ENCOUNTER — Encounter: Payer: Self-pay | Admitting: Internal Medicine

## 2017-10-19 DIAGNOSIS — D509 Iron deficiency anemia, unspecified: Secondary | ICD-10-CM | POA: Diagnosis not present

## 2017-10-19 DIAGNOSIS — R1013 Epigastric pain: Secondary | ICD-10-CM | POA: Diagnosis not present

## 2017-10-19 DIAGNOSIS — K2940 Chronic atrophic gastritis without bleeding: Secondary | ICD-10-CM | POA: Diagnosis not present

## 2017-10-21 ENCOUNTER — Other Ambulatory Visit: Payer: Self-pay | Admitting: Internal Medicine

## 2017-10-26 DIAGNOSIS — K2940 Chronic atrophic gastritis without bleeding: Secondary | ICD-10-CM | POA: Diagnosis not present

## 2017-11-01 ENCOUNTER — Other Ambulatory Visit (INDEPENDENT_AMBULATORY_CARE_PROVIDER_SITE_OTHER): Payer: Medicare Other

## 2017-11-01 DIAGNOSIS — I10 Essential (primary) hypertension: Secondary | ICD-10-CM

## 2017-11-01 LAB — BASIC METABOLIC PANEL
BUN: 18 mg/dL (ref 6–23)
CO2: 33 mEq/L — ABNORMAL HIGH (ref 19–32)
CREATININE: 0.71 mg/dL (ref 0.40–1.20)
Calcium: 8.9 mg/dL (ref 8.4–10.5)
Chloride: 103 mEq/L (ref 96–112)
GFR: 86.22 mL/min (ref >60.00)
Glucose, Bld: 133 mg/dL — ABNORMAL HIGH (ref 70–99)
Potassium: 4.4 mEq/L (ref 3.5–5.1)
Sodium: 141 mEq/L (ref 135–145)

## 2017-11-25 ENCOUNTER — Other Ambulatory Visit: Payer: Self-pay | Admitting: Internal Medicine

## 2017-12-06 ENCOUNTER — Telehealth: Payer: Self-pay

## 2017-12-06 NOTE — Telephone Encounter (Signed)
Received form from [facility] for DM supplies. Form completed and faxed to [facility] at [phone]. Form sent for scanning.

## 2017-12-08 DIAGNOSIS — H5213 Myopia, bilateral: Secondary | ICD-10-CM | POA: Diagnosis not present

## 2017-12-08 DIAGNOSIS — H25813 Combined forms of age-related cataract, bilateral: Secondary | ICD-10-CM | POA: Diagnosis not present

## 2017-12-08 DIAGNOSIS — H04123 Dry eye syndrome of bilateral lacrimal glands: Secondary | ICD-10-CM | POA: Diagnosis not present

## 2017-12-08 DIAGNOSIS — H353131 Nonexudative age-related macular degeneration, bilateral, early dry stage: Secondary | ICD-10-CM | POA: Diagnosis not present

## 2017-12-08 DIAGNOSIS — E119 Type 2 diabetes mellitus without complications: Secondary | ICD-10-CM | POA: Diagnosis not present

## 2017-12-08 LAB — HM DIABETES EYE EXAM

## 2018-01-20 ENCOUNTER — Other Ambulatory Visit: Payer: Self-pay | Admitting: Internal Medicine

## 2018-01-27 ENCOUNTER — Other Ambulatory Visit: Payer: Self-pay | Admitting: Internal Medicine

## 2018-01-27 DIAGNOSIS — Z1231 Encounter for screening mammogram for malignant neoplasm of breast: Secondary | ICD-10-CM

## 2018-02-19 ENCOUNTER — Other Ambulatory Visit: Payer: Self-pay | Admitting: Internal Medicine

## 2018-02-21 ENCOUNTER — Encounter: Payer: Self-pay | Admitting: Internal Medicine

## 2018-03-06 ENCOUNTER — Encounter: Payer: Self-pay | Admitting: Physician Assistant

## 2018-03-06 ENCOUNTER — Ambulatory Visit (INDEPENDENT_AMBULATORY_CARE_PROVIDER_SITE_OTHER): Payer: Medicare Other | Admitting: Physician Assistant

## 2018-03-06 ENCOUNTER — Telehealth: Payer: Self-pay | Admitting: Neurology

## 2018-03-06 ENCOUNTER — Other Ambulatory Visit: Payer: Self-pay

## 2018-03-06 VITALS — BP 118/64 | HR 57 | Temp 97.8°F | Resp 14 | Ht 63.0 in | Wt 215.0 lb

## 2018-03-06 DIAGNOSIS — Z23 Encounter for immunization: Secondary | ICD-10-CM

## 2018-03-06 DIAGNOSIS — J069 Acute upper respiratory infection, unspecified: Secondary | ICD-10-CM | POA: Diagnosis not present

## 2018-03-06 DIAGNOSIS — B9789 Other viral agents as the cause of diseases classified elsewhere: Secondary | ICD-10-CM | POA: Diagnosis not present

## 2018-03-06 MED ORDER — BENZONATATE 100 MG PO CAPS: 100.0000 mg | ORAL_CAPSULE | Freq: Two times a day (BID) | ORAL | 0 refills | Status: DC | PRN

## 2018-03-06 NOTE — Patient Instructions (Addendum)
Your exam is good today. Symptoms seem consistent with a viral upper respiratory infection.  Keep well-hydrated and get plenty of rest.  If you have a humidifier, place in the bedroom and run at night.  Ok to continue your OTC cough medication. The Tessalon will help with nighttime cough and allow you to rest.  Symptoms should continue to resolve.

## 2018-03-06 NOTE — Progress Notes (Signed)
Acute Office Visit  Subjective:    Patient ID: Casey Bryan, female    DOB: Jul 29, 1945, 72 y.o.   MRN: [id number]  Chief Complaint  Patient presents with  . URI   HPI Patient is in today c/o 3 days of nasal congestion, rhinorrhea, sore throat and dry cough. Notes sore throat has resolved as of today. Still having other symptoms but seem to be leveling off. Denies any fever, chills, chest pain or SOB. Denies recent travel. Has multiple grandchildren and notes that at least one of them is sick at all times. Has taken Robitussin for cough with some relief.  Past Medical History:  Diagnosis Date  . Amaurosis fugax 12/12/2015  . Diabetes mellitus   . GERD (gastroesophageal reflux disease)   . Glaucoma 04-2012  . Hyperlipemia   . Hypertension     Past Surgical History:  Procedure Laterality Date  . NO PAST SURGERIES      Family History  Problem Relation Age of Onset  . Diabetes Mother        uncles, mother , father   . Coronary artery disease Unknown        F age 53, B age 66, uncle age 73 : MI  . Heart attack Brother 43  . Breast cancer Sister   . Multiple myeloma Sister   . Colon cancer Neg Hx     Social History   Socioeconomic History  . Marital status: Married    Spouse name: Not on file  . Number of children: 3  . Years of education: Not on file  . Highest education level: Not on file  Occupational History  . Occupation: semi-retired,own a busines, Optometrist  Social Needs  . Financial resource strain: Not on file  . Food insecurity:    Worry: Not on file    Inability: Not on file  . Transportation needs:    Medical: Not on file    Non-medical: Not on file  Tobacco Use  . Smoking status: Never Smoker  . Smokeless tobacco: Never Used  Substance and Sexual Activity  . Alcohol use: Yes    Comment: socially  . Drug use: No  . Sexual activity: Not on file  Lifestyle  . Physical activity:    Days per week: Not on file    Minutes per session: Not on file    . Stress: Not on file  Relationships  . Social connections:    Talks on phone: Not on file    Gets together: Not on file    Attends religious service: Not on file    Active member of club or organization: Not on file    Attends meetings of clubs or organizations: Not on file    Relationship status: Not on file  . Intimate partner violence:    Fear of current or ex partner: Not on file    Emotionally abused: Not on file    Physically abused: Not on file    Forced sexual activity: Not on file  Other Topics Concern  . Not on file  Social History Narrative   Original [place]     Outpatient Medications Prior to Visit  Medication Sig Dispense Refill  . aspirin EC 81 MG tablet Take 81 mg by mouth daily.    Marland Kitchen atorvastatin (LIPITOR) 80 MG tablet Take 1 tablet (80 mg total) by mouth daily. 90 tablet 1  . Blood Glucose Monitoring Suppl (ADVOCATE REDI-CODE) DEVI by Does not apply route. Reported on 04/22/2015    .  Calcium Carbonate-Vit D-Min (CALCIUM 1200 PO) Take 1,000 mg by mouth.     Marland Kitchen glucose blood (ONE TOUCH ULTRA TEST) test strip CHECK BLOOD SUGAR NO MORE THAN TWICE DAILY 100 each 12  . irbesartan-hydrochlorothiazide (AVALIDE) 300-12.5 MG tablet Take 1 tablet by mouth daily. 30 tablet 5  . lidocaine (XYLOCAINE) 5 % ointment Apply 1 application topically 2 (two) times daily as needed.     . metFORMIN (GLUCOPHAGE) 1000 MG tablet Take 1 tablet (1,000 mg total) by mouth 2 (two) times daily with a meal. 90 tablet 1  . metoprolol tartrate (LOPRESSOR) 50 MG tablet Take 2 tablets (100 mg total) by mouth 2 (two) times daily. 360 tablet 1  . Multiple Vitamin (MULTIVITAMIN) tablet Take 1 tablet by mouth daily.      . Multiple Vitamins-Minerals (EYE VITAMINS) CAPS Take 1 capsule by mouth daily.    Marland Kitchen NEXIUM 40 MG capsule Take 1 capsule (40 mg total) by mouth daily. 90 capsule 3  . ONETOUCH DELICA LANCETS 97F MISC USE TO CHECK BLOOD SUGARS NO MORE THAN TWICE DAILY 100 each 99  . ranitidine (ZANTAC)  150 MG capsule Take 150 mg by mouth daily as needed.       No facility-administered medications prior to visit.     Allergies  Allergen Reactions  . Prevnar [Pneumococcal 13-Val Conj Vacc] Nausea And Vomiting and Other (See Comments)    Fever of 102.   . Clarithromycin [Clarithromycin]     Contradictions w/Vytorin  . Penicillins   . Tetanus-Diphtheria Toxoids Td     REACTION: see OV note  02-11-10   ROS Pertinent ROS are listed in the HPI.    Objective:    Physical Exam  Constitutional: She is oriented to person, place, and time. She appears well-developed and well-nourished.  HENT:  Head: Normocephalic and atraumatic.  Right Ear: External ear normal.  Left Ear: External ear normal.  Nose: Nose normal.  Mouth/Throat: Oropharynx is clear and moist. No oropharyngeal exudate.  TM within normal limits bilaterally.  Eyes: Conjunctivae are normal.  Neck: Neck supple.  Cardiovascular: Normal rate, regular rhythm and normal heart sounds.  Pulmonary/Chest: Effort normal and breath sounds normal. No respiratory distress. She has no wheezes. She has no rales. She exhibits no tenderness.  Neurological: She is alert and oriented to person, place, and time.  Skin: No rash noted.  Psychiatric: She has a normal mood and affect.  Vitals reviewed.   BP 118/64   Pulse (!) 57   Temp 97.8 F (36.6 C) (Oral)   Resp 14   Ht _0  (1.6 m)   Wt 215 lb (97.5 kg)   SpO2 97%   BMI 38.09 kg/m  Wt Readings from Last 3 Encounters:  03/06/18 215 lb (97.5 kg)  09/21/17 213 lb (96.6 kg)  03/23/17 211 lb 6 oz (95.9 kg)    Health Maintenance Due  Topic Date Due  . FOOT EXAM  07/08/2017  . MAMMOGRAM  11/08/2017  . INFLUENZA VACCINE  11/17/2017    There are no preventive care reminders to display for this patient.   Lab Results  Component Value Date   TSH 0.81 12/12/2015   Lab Results  Component Value Date   WBC 3.9 (L) 09/21/2017   HGB 10.5 (L) 09/21/2017   HCT 31.8 (L) 09/21/2017    MCV 81.6 09/21/2017   PLT 194.0 09/21/2017   Lab Results  Component Value Date   NA 141 11/01/2017   K 4.4 11/01/2017  CO2 33 (H) 11/01/2017   GLUCOSE 133 (H) 11/01/2017   BUN 18 11/01/2017   CREATININE 0.71 11/01/2017   BILITOT 0.5 09/21/2017   ALKPHOS 49 09/21/2017   AST 15 09/21/2017   ALT 11 09/21/2017   PROT 6.4 09/21/2017   ALBUMIN 3.9 09/21/2017   CALCIUM 8.9 11/01/2017   GFR 86.22 11/01/2017   Lab Results  Component Value Date   CHOL 126 03/23/2017   Lab Results  Component Value Date   HDL 50.70 03/23/2017   Lab Results  Component Value Date   LDLCALC 57 03/23/2017   Lab Results  Component Value Date   TRIG 88.0 03/23/2017   Lab Results  Component Value Date   CHOLHDL 2 03/23/2017   Lab Results  Component Value Date   HGBA1C 6.7 (H) 09/21/2017       Assessment & Plan:   1. Viral URI with cough Very mild symptoms. Seems to be improving on its own. Exam unremarkable. Patient afebrile. Supportive measures and OTC medications reviewed. Rx Tessalon to take as directed.  2. Encounter for immunization - Flu vaccine HIGH DOSE PF   No orders of the defined types were placed in this encounter.    Leeanne Rio, PA-C

## 2018-03-06 NOTE — Telephone Encounter (Signed)
Patient called and left a vm about an ultrasound appt that was supposed to be ordered. Please call her back at [phone].

## 2018-03-07 NOTE — Progress Notes (Signed)
NEUROLOGY FOLLOW UP OFFICE NOTE  ANJALINA BERGEVIN [id number]  HISTORY OF PRESENT ILLNESS: Casey Bryan is a 72 year old right-handed woman with hypertension, type 2 diabetes, GERD, glaucoma, and vitamin D deficiency who follows up for stroke-like episode/left amaurosis fugax and occipital headaches.  She is accompanied by her daughter who supplements history.  UPDATE: Current medications:  ASA 34m daily, Lipitor 854mdaily, Avalide, Lopressor, Metformin   Patient last seen in November 2018.  Due to new onset headaches, MRI of the brain was ordered but not performed.  She did not have repeat carotid Doppler performed.  LDL from 03/23/17 was 57.  Hgb A1c from 09/21/17 was 6.7.  Headaches have since resolved.  No recurrent transient vision loss.  She now has cataract.    HISTORY:  Left amaurosis fugax/strokelike event On 03/24/15, she had loss of vision in the left eye, lasting 20 to 30 minutes and then gradually improved. She saw ophthalmology who suspected stroke or migraine. She started ASA 8139maily. Since then, she noted numbness and tingling off and on involving the right side of her body (face, arm and leg). She denies weakness or recurrent visual disturbance. 2D echo showed normal EF of 60%-65% with grade 2 diastolic dysfunction. Carotid doppler showed 40-59% right ICA stenosis, but 1-39% left ICA stenosis. Sed Rate was 27. Hgb A1c was 6.4%. She had an MRI and MRA of the head performed on 05/04/15, which showed mild small vessel ischemic changes but no acute/subacute infarct or high-grade stenosis or occlusion of the intracranial vessels.  Carotid doppler from 03/18/16 demonstrated progression of stenosis, now with both ICAs 40-59%.  She was advised to continue ASA therapy, statin therapy and blood pressure and glycemic control.   Headache During the summer 2018 she began experiencing a new headache.  It is a mild to severe pressure-like occipital headache.  There is no neck pain.   There is associated photosensitivity but no nausea, vomiting, phonophobia or visual disturbance.  It lasts from 30 minutes to 2 hours.  It occurs 3 days a week.  She usually wakes up with the headache and improves later in the day, as movement and activity help relieve it.  Nothing specific triggers it.  She does not take pain reliever for it.  She hydrates with water and has a cup of coffee.  She denies excessive daytime sleepiness.  She usually sleeps okay.  PAST MEDICAL HISTORY: Past Medical History:  Diagnosis Date  . Amaurosis fugax 12/12/2015  . Diabetes mellitus   . GERD (gastroesophageal reflux disease)   . Glaucoma 04-2012  . Hyperlipemia   . Hypertension     MEDICATIONS: Current Outpatient Medications on File Prior to Visit  Medication Sig Dispense Refill  . aspirin EC 81 MG tablet Take 81 mg by mouth daily.    . aMarland Kitchenorvastatin (LIPITOR) 80 MG tablet Take 1 tablet (80 mg total) by mouth daily. 90 tablet 1  . benzonatate (TESSALON) 100 MG capsule Take 1 capsule (100 mg total) by mouth 2 (two) times daily as needed for cough. 20 capsule 0  . Blood Glucose Monitoring Suppl (ADVOCATE REDI-CODE) DEVI by Does not apply route. Reported on 04/22/2015    . Calcium Carbonate-Vit D-Min (CALCIUM 1200 PO) Take 1,000 mg by mouth.     . gMarland Kitchenucose blood (ONE TOUCH ULTRA TEST) test strip CHECK BLOOD SUGAR NO MORE THAN TWICE DAILY 100 each 12  . irbesartan-hydrochlorothiazide (AVALIDE) 300-12.5 MG tablet Take 1 tablet by mouth daily. 30 tablet 5  .  lidocaine (XYLOCAINE) 5 % ointment Apply 1 application topically 2 (two) times daily as needed.     . metFORMIN (GLUCOPHAGE) 1000 MG tablet Take 1 tablet (1,000 mg total) by mouth 2 (two) times daily with a meal. 90 tablet 1  . metoprolol tartrate (LOPRESSOR) 50 MG tablet Take 2 tablets (100 mg total) by mouth 2 (two) times daily. 360 tablet 1  . Multiple Vitamin (MULTIVITAMIN) tablet Take 1 tablet by mouth daily.      . Multiple Vitamins-Minerals (EYE  VITAMINS) CAPS Take 1 capsule by mouth daily.    Marland Kitchen NEXIUM 40 MG capsule Take 1 capsule (40 mg total) by mouth daily. 90 capsule 3  . ONETOUCH DELICA LANCETS 15X MISC USE TO CHECK BLOOD SUGARS NO MORE THAN TWICE DAILY 100 each 99  . ranitidine (ZANTAC) 150 MG capsule Take 150 mg by mouth daily as needed.       No current facility-administered medications on file prior to visit.     ALLERGIES: Allergies  Allergen Reactions  . Prevnar [Pneumococcal 13-Val Conj Vacc] Nausea And Vomiting and Other (See Comments)    Fever of 102.   . Clarithromycin [Clarithromycin]     Contradictions w/Vytorin  . Penicillins   . Tetanus-Diphtheria Toxoids Td     REACTION: see OV note  02-11-10    FAMILY HISTORY: Family History  Problem Relation Age of Onset  . Diabetes Mother        uncles, mother , father   . Coronary artery disease Unknown        F age 64, B age 56, uncle age 45 : MI  . Heart attack Brother 43  . Breast cancer Sister   . Multiple myeloma Sister   . Colon cancer Neg Hx    SOCIAL HISTORY: Social History   Socioeconomic History  . Marital status: Married    Spouse name: Not on file  . Number of children: 3  . Years of education: Not on file  . Highest education level: Not on file  Occupational History  . Occupation: semi-retired,own a busines, Optometrist  Social Needs  . Financial resource strain: Not on file  . Food insecurity:    Worry: Not on file    Inability: Not on file  . Transportation needs:    Medical: Not on file    Non-medical: Not on file  Tobacco Use  . Smoking status: Never Smoker  . Smokeless tobacco: Never Used  Substance and Sexual Activity  . Alcohol use: Yes    Comment: socially  . Drug use: No  . Sexual activity: Not on file  Lifestyle  . Physical activity:    Days per week: Not on file    Minutes per session: Not on file  . Stress: Not on file  Relationships  . Social connections:    Talks on phone: Not on file    Gets together: Not on  file    Attends religious service: Not on file    Active member of club or organization: Not on file    Attends meetings of clubs or organizations: Not on file    Relationship status: Not on file  . Intimate partner violence:    Fear of current or ex partner: Not on file    Emotionally abused: Not on file    Physically abused: Not on file    Forced sexual activity: Not on file  Other Topics Concern  . Not on file  Social History Narrative   Original  [place]     REVIEW OF SYSTEMS: Constitutional: No fevers, chills, or sweats, no generalized fatigue, change in appetite Eyes: No visual changes, double vision, eye pain Ear, nose and throat: No hearing loss, ear pain, nasal congestion, sore throat Cardiovascular: No chest pain, palpitations Respiratory:  No shortness of breath at rest or with exertion, wheezes GastrointestinaI: No nausea, vomiting, diarrhea, abdominal pain, fecal incontinence Genitourinary:  No dysuria, urinary retention or frequency Musculoskeletal:  No neck pain, back pain Integumentary: No rash, pruritus, skin lesions Neurological: as above Psychiatric: No depression, insomnia, anxiety Endocrine: No palpitations, fatigue, diaphoresis, mood swings, change in appetite, change in weight, increased thirst Hematologic/Lymphatic:  No purpura, petechiae. Allergic/Immunologic: no itchy/runny eyes, nasal congestion, recent allergic reactions, rashes  PHYSICAL EXAM: Blood pressure 134/64, pulse 60, height '5\' 4"'  (1.626 m), weight 213 lb (96.6 kg), SpO2 96 %. General: No acute distress.  Patient appears well-groomed.   Head:  Normocephalic/atraumatic Eyes:  Fundi examined but not visualized Neck: supple, no paraspinal tenderness, full range of motion Heart:  Regular rate and rhythm Lungs:  Clear to auscultation bilaterally Back: No paraspinal tenderness Neurological Exam: alert and oriented to person, place, and time. Attention span and concentration intact, recent and  remote memory intact, fund of knowledge intact.  Speech fluent and not dysarthric, language intact.  CN II-XII intact. Bulk and tone normal, muscle strength 5/5 throughout.  Sensation to light touch intact.  Deep tendon reflexes 2+ throughout, toes downgoing.  Finger to nose testing intact.  Gait normal, Romberg negative.  IMPRESSION: 1.  Left amaurosis fugax/strokelike event.  MRI of brain at the time negative. 2.  Bilateral carotid artery stenosis 3.  Hypertension 4.  Type 2 diabetes mellitus 5.  Hyperlipidemia  PLAN: 1.  We will schedule carotid doppler.  Should be done annually 2.  Continue ASA 7m daily for secondary stroke prevention 3.  Continue atorvastatin 863mdaily (LDL at goal of less than 70) 4.  Continue blood pressure and glycemic control 5.  Continue Mediterranean diet 6.  Follow up in one year (after repeat carotid doppler next year) 7.  As headaches have long since resolved, we can defer MRI of brain.  18 minutes spent face to face with patient, over 50% spent discussing management.  AdMetta ClinesDO  CC: JoKathlene NovemberMD

## 2018-03-08 ENCOUNTER — Ambulatory Visit (INDEPENDENT_AMBULATORY_CARE_PROVIDER_SITE_OTHER): Payer: Medicare Other | Admitting: Neurology

## 2018-03-08 ENCOUNTER — Ambulatory Visit
Admission: RE | Admit: 2018-03-08 | Discharge: 2018-03-08 | Disposition: A | Discharge status: Home / Self Care | Payer: Medicare Other | Source: Ambulatory Visit | Attending: Internal Medicine | Admitting: Internal Medicine

## 2018-03-08 ENCOUNTER — Encounter: Payer: Self-pay | Admitting: Neurology

## 2018-03-08 VITALS — BP 134/64 | HR 60 | Ht 64.0 in | Wt 213.0 lb

## 2018-03-08 DIAGNOSIS — G459 Transient cerebral ischemic attack, unspecified: Secondary | ICD-10-CM

## 2018-03-08 DIAGNOSIS — I6523 Occlusion and stenosis of bilateral carotid arteries: Secondary | ICD-10-CM | POA: Diagnosis not present

## 2018-03-08 DIAGNOSIS — E785 Hyperlipidemia, unspecified: Secondary | ICD-10-CM

## 2018-03-08 DIAGNOSIS — G453 Amaurosis fugax: Secondary | ICD-10-CM | POA: Diagnosis not present

## 2018-03-08 DIAGNOSIS — Z794 Long term (current) use of insulin: Secondary | ICD-10-CM | POA: Diagnosis not present

## 2018-03-08 DIAGNOSIS — Z1231 Encounter for screening mammogram for malignant neoplasm of breast: Secondary | ICD-10-CM | POA: Diagnosis not present

## 2018-03-08 DIAGNOSIS — I10 Essential (primary) hypertension: Secondary | ICD-10-CM | POA: Diagnosis not present

## 2018-03-08 DIAGNOSIS — E119 Type 2 diabetes mellitus without complications: Secondary | ICD-10-CM

## 2018-03-08 NOTE — Patient Instructions (Addendum)
1.  Continue aspirin 81mg  daily 2.  Continue atorvastatin 80mg  daily 3.  Continue blood pressure and sugar control 4.  We will repeat carotid doppler 5.  Continue Mediterranean diet 6.  We will repeat carotid doppler again in one year.  Follow up afterwards.  Your carotid doppler is scheduled for: Friday 03/10/18 arrival time 10:40am at [facility] here in the [facility] on the 1st floor.

## 2018-03-08 NOTE — Telephone Encounter (Signed)
Talked with Pt and daughter, U/S scheduled for 11/22

## 2018-03-10 ENCOUNTER — Ambulatory Visit
Admission: RE | Admit: 2018-03-10 | Discharge: 2018-03-10 | Disposition: A | Discharge status: Home / Self Care | Payer: Medicare Other | Source: Ambulatory Visit | Attending: Neurology | Admitting: Neurology

## 2018-03-10 DIAGNOSIS — I6523 Occlusion and stenosis of bilateral carotid arteries: Secondary | ICD-10-CM | POA: Diagnosis not present

## 2018-03-15 ENCOUNTER — Telehealth: Payer: Self-pay

## 2018-03-15 NOTE — Telephone Encounter (Signed)
-----   Message from Pieter Partridge, DO sent at 03/15/2018 11:59 AM EST ----- No significant narrowing of the carotid arteries.

## 2018-03-15 NOTE — Telephone Encounter (Signed)
Called and LMOVM.  

## 2018-03-19 DIAGNOSIS — J209 Acute bronchitis, unspecified: Secondary | ICD-10-CM | POA: Diagnosis not present

## 2018-03-28 ENCOUNTER — Encounter: Payer: Self-pay | Admitting: Internal Medicine

## 2018-03-28 ENCOUNTER — Telehealth: Payer: Self-pay

## 2018-03-28 ENCOUNTER — Ambulatory Visit (INDEPENDENT_AMBULATORY_CARE_PROVIDER_SITE_OTHER): Payer: Medicare Other | Admitting: Internal Medicine

## 2018-03-28 VITALS — BP 124/82 | HR 64 | Temp 97.6°F | Resp 16 | Ht 64.0 in | Wt 213.2 lb

## 2018-03-28 DIAGNOSIS — E119 Type 2 diabetes mellitus without complications: Secondary | ICD-10-CM | POA: Diagnosis not present

## 2018-03-28 DIAGNOSIS — Z794 Long term (current) use of insulin: Secondary | ICD-10-CM | POA: Diagnosis not present

## 2018-03-28 DIAGNOSIS — I6523 Occlusion and stenosis of bilateral carotid arteries: Secondary | ICD-10-CM | POA: Diagnosis not present

## 2018-03-28 DIAGNOSIS — I10 Essential (primary) hypertension: Secondary | ICD-10-CM

## 2018-03-28 DIAGNOSIS — E785 Hyperlipidemia, unspecified: Secondary | ICD-10-CM

## 2018-03-28 DIAGNOSIS — D509 Iron deficiency anemia, unspecified: Secondary | ICD-10-CM

## 2018-03-28 LAB — BASIC METABOLIC PANEL
BUN: 17 mg/dL (ref 6–23)
CHLORIDE: 103 meq/L (ref 96–112)
CO2: 33 meq/L — AB (ref 19–32)
Calcium: 9.3 mg/dL (ref 8.4–10.5)
Creatinine, Ser: 0.66 mg/dL (ref 0.40–1.20)
GFR: 93.69 mL/min (ref >60.00)
Glucose, Bld: 136 mg/dL — ABNORMAL HIGH (ref 70–99)
Potassium: 4.3 mEq/L (ref 3.5–5.1)
Sodium: 143 mEq/L (ref 135–145)

## 2018-03-28 LAB — CBC WITH DIFFERENTIAL/PLATELET
Basophils Absolute: 0 10*3/uL (ref 0.0–0.1)
Basophils Relative: 0.5 % (ref 0.0–3.0)
Eosinophils Absolute: 0.1 10*3/uL (ref 0.0–0.7)
Eosinophils Relative: 2.4 % (ref 0.0–5.0)
HCT: 35.4 % — ABNORMAL LOW (ref 36.0–46.0)
Hemoglobin: 11.7 g/dL — ABNORMAL LOW (ref 12.0–15.0)
Lymphocytes Relative: 32.1 % (ref 12.0–46.0)
Lymphs Abs: 1.8 10*3/uL (ref 0.7–4.0)
MCHC: 33.1 g/dL (ref 30.0–36.0)
MCV: 84.8 fl (ref 78.0–100.0)
Monocytes Absolute: 0.4 10*3/uL (ref 0.1–1.0)
Monocytes Relative: 7 % (ref 3.0–12.0)
NEUTROS ABS: 3.3 10*3/uL (ref 1.4–7.7)
Neutrophils Relative %: 58 % (ref 43.0–77.0)
Platelets: 223 10*3/uL (ref 150.0–400.0)
RBC: 4.17 Mil/uL (ref 3.87–5.11)
RDW: 15.9 % — ABNORMAL HIGH (ref 11.5–15.5)
WBC: 5.8 10*3/uL (ref 4.0–10.5)

## 2018-03-28 LAB — LIPID PANEL
Cholesterol: 131 mg/dL (ref 0–200)
HDL: 50.5 mg/dL (ref >39.00)
LDL CALC: 64 mg/dL (ref 0–99)
NonHDL: 80.85
Total CHOL/HDL Ratio: 3
Triglycerides: 84 mg/dL (ref 0.0–149.0)
VLDL: 16.8 mg/dL (ref 0.0–40.0)

## 2018-03-28 LAB — HEMOGLOBIN A1C: Hgb A1c MFr Bld: 6.6 % — ABNORMAL HIGH (ref 4.6–6.5)

## 2018-03-28 NOTE — Progress Notes (Signed)
Pre visit review using our clinic review tool, if applicable. No additional management support is needed unless otherwise documented below in the visit note. 

## 2018-03-28 NOTE — Telephone Encounter (Signed)
PA initiated via Covermymeds; KEY: [id number]. Awaiting determination.

## 2018-03-28 NOTE — Progress Notes (Signed)
Subjective:    Patient ID: Casey Bryan, female    DOB: [birth year], 72 y.o.   MRN: [id number]  DOS:  03/28/2018 Type of visit - description : Routine checkup -ince the last office visit, was seen with a URI 03/06/2018, was sick for 3 days, prescribed benzonatate.  On 03/19/2018, went to urgent care, reports a chest x-ray was negative, was prescribed doxycycline.  Still has dry cough. -Saw neurology, note reviewed GERD: Reviewed with a letter from her insurance regards Nexium.  Symptoms control DM: Takes only 1 metformin daily. Iron deficiency: Saw Dr. Oletta Lamas, had a EGD.   Review of Systems Denies fever chills Deny acid reflux symptoms, on PPIs Denies a sneezing, occasional itchy eyes and nose.  Past Medical History:  Diagnosis Date  . Amaurosis fugax 12/12/2015  . Diabetes mellitus   . GERD (gastroesophageal reflux disease)   . Glaucoma 04-2012  . Hyperlipemia   . Hypertension     Past Surgical History:  Procedure Laterality Date  . NO PAST SURGERIES      Social History   Socioeconomic History  . Marital status: Married    Spouse name: Not on file  . Number of children: 3  . Years of education: Not on file  . Highest education level: Not on file  Occupational History  . Occupation: semi-retired,own a busines, Optometrist  Social Needs  . Financial resource strain: Not on file  . Food insecurity:    Worry: Not on file    Inability: Not on file  . Transportation needs:    Medical: Not on file    Non-medical: Not on file  Tobacco Use  . Smoking status: Never Smoker  . Smokeless tobacco: Never Used  Substance and Sexual Activity  . Alcohol use: Yes    Comment: socially  . Drug use: No  . Sexual activity: Not on file  Lifestyle  . Physical activity:    Days per week: Not on file    Minutes per session: Not on file  . Stress: Not on file  Relationships  . Social connections:    Talks on phone: Not on file    Gets together: Not on file    Attends religious  service: Not on file    Active member of club or organization: Not on file    Attends meetings of clubs or organizations: Not on file    Relationship status: Not on file  . Intimate partner violence:    Fear of current or ex partner: Not on file    Emotionally abused: Not on file    Physically abused: Not on file    Forced sexual activity: Not on file  Other Topics Concern  . Not on file  Social History Narrative   Original [place]       Allergies as of 03/28/2018      Reactions   Prevnar [pneumococcal 13-val Conj Vacc] Nausea And Vomiting, Other (See Comments)   Fever of 102.    Clarithromycin [clarithromycin]    Contradictions w/Vytorin   Penicillins    Tetanus-diphtheria Toxoids Td    REACTION: see OV note  02-11-10      Medication List        Accurate as of 03/28/18 11:59 PM. Always use your most recent med list.          [place] by Does not apply route. Reported on 04/22/2015   aspirin EC 81 MG tablet Take 81 mg by mouth daily.  atorvastatin 80 MG tablet Commonly known as:  LIPITOR Take 1 tablet (80 mg total) by mouth daily.   benzonatate 100 MG capsule Commonly known as:  TESSALON Take 1 capsule (100 mg total) by mouth 2 (two) times daily as needed for cough.   CALCIUM 1200 PO Take 1,000 mg by mouth.   doxycycline 100 MG tablet Commonly known as:  VIBRA-TABS Take 100 mg by mouth 2 (two) times daily.   EYE VITAMINS Caps Take 1 capsule by mouth daily.   glucose blood test strip CHECK BLOOD SUGAR NO MORE THAN TWICE DAILY   irbesartan-hydrochlorothiazide 300-12.5 MG tablet Commonly known as:  AVALIDE Take 1 tablet by mouth daily.   lidocaine 5 % ointment Commonly known as:  XYLOCAINE Apply 1 application topically 2 (two) times daily as needed.   metFORMIN 1000 MG tablet Commonly known as:  GLUCOPHAGE Take 1 tablet (1,000 mg total) by mouth daily with breakfast.   metoprolol tartrate 50 MG tablet Commonly known as:   LOPRESSOR Take 2 tablets (100 mg total) by mouth 2 (two) times daily.   multivitamin tablet Take 1 tablet by mouth daily.   NEXIUM 40 MG capsule Generic drug:  esomeprazole Take 1 capsule (40 mg total) by mouth daily.   ONETOUCH DELICA LANCETS 97L Misc USE TO CHECK BLOOD SUGARS NO MORE THAN TWICE DAILY   promethazine-dextromethorphan 6.25-15 MG/5ML syrup Commonly known as:  PROMETHAZINE-DM Take 5 mLs by mouth every 6 (six) hours as needed for cough.   ranitidine 150 MG capsule Commonly known as:  ZANTAC Take 150 mg by mouth daily as needed.           Objective:   Physical Exam BP 124/82 (BP Location: Left Arm, Patient Position: Sitting, Cuff Size: Normal)   Pulse 64   Temp 97.6 F (36.4 C) (Oral)   Resp 16   Ht 5\' 4"  (1.626 m)   Wt 213 lb 4 oz (96.7 kg)   SpO2 96%   BMI 36.60 kg/m  General:   Well developed, NAD, BMI noted. HEENT:. Normocephalic . Face symmetric, atraumatic. TMs normal, nose is slightly congested, throat symmetric and not red Lungs:  CTA B Normal respiratory effort, no intercostal retractions, no accessory muscle use. Heart: RRR,  no murmur.  No pretibial edema bilaterally  Skin: Not pale. Not jaundice Neurologic:  alert & oriented X3.  Speech normal, gait appropriate for age and unassisted Psych--  Cognition and judgment appear intact.  Cooperative with normal attention span and concentration.  Behavior appropriate. No anxious or depressed appearing.      Assessment & Plan:     Assessment: DM HTN (intolerant amlodipine 11-2015 and 2018) Hyperlipidemia Glaucoma dx 2014, cataracts NEURO: L amaurosix fugax 03-2015, CVA -- saw opthalmology, started ASA  (02-2015); echo 04-09-15: nl EF, grade 2 Diast disfx -- saw neuro: Probable cryptogenic  --Headaches: Sees  neurology   CV: Carotid artery disease:   [place] 04-2015: Progression of the right ICA now 40-59%. Stable left ICA 1-39%. GI: -GERD -Gastri polyp removal 08-2015, Dr  Oletta Lamas  PLAN: DM: Currently on metformin, she has been taking only 1 tablet a day for long time  (was RX BID).  Check A1c, consider increase to twice daily HTN: Seems well controlled on Avalide, metoprolol.  Check a BMP Hyperlipidemia: On Lipitor, check FLP, last LFTs normal. History of anemia, iron deficiency.  Saw Dr. Oletta Lamas this year, reports she had a EGD.  No records.  Recommend to see him on follow-up as recommended.we will  try to get records GERD: Her insurance approve Nexium generic but she needs the branded one, she also failed to omeprazole, will do a PA.. Carotid artery disease: Saw neurology, she was felt to have left amaurosis fugax/strokelike event. He recommended carotid Dopplers annually.last 02/2018, minimal dz, next 1 year Cough: Was seen at the urgent care 10 days ago, chest x-ray negative per patient, finishing a 10-day round of doxycycline, still has cough.  Recommend Mucinex DM, Flonase. RTC 6 months

## 2018-03-28 NOTE — Patient Instructions (Addendum)
Please schedule Medicare Wellness with Glenard Haring.   GO TO THE LAB : Get the blood work     GO TO THE FRONT DESK Schedule your next appointment for a checkup in 6 months  Finish the antibiotic as prescribed by the urgent care Mucinex DM twice a day for cough Use Flonase 2 sprays on each side of the nose consistently for 3 weeks. Call if not gradually improving.  Please see Dr. Oletta Lamas for follow-up as recommended

## 2018-03-29 NOTE — Assessment & Plan Note (Signed)
DM: Currently on metformin, she has been taking only 1 tablet a day for long time  (was RX BID).  Check A1c, consider increase to twice daily HTN: Seems well controlled on Avalide, metoprolol.  Check a BMP Hyperlipidemia: On Lipitor, check FLP, last LFTs normal. History of anemia, iron deficiency.  Saw Dr. Oletta Lamas this year, reports she had a EGD.  No records.  Recommend to see him on follow-up as recommended.we will try to get records GERD: Her insurance approve Nexium generic but she needs the branded one, she also failed to omeprazole, will do a PA.. Carotid artery disease: Saw neurology, she was felt to have left amaurosis fugax/strokelike event. He recommended carotid Dopplers annually.last 02/2018, minimal dz, next 1 year Cough: Was seen at the urgent care 10 days ago, chest x-ray negative per patient, finishing a 10-day round of doxycycline, still has cough.  Recommend Mucinex DM, Flonase. RTC 6 months

## 2018-03-30 NOTE — Telephone Encounter (Signed)
PA approved through 04/19/2019.  

## 2018-04-19 ENCOUNTER — Other Ambulatory Visit: Payer: Self-pay | Admitting: Internal Medicine

## 2018-04-20 ENCOUNTER — Other Ambulatory Visit: Payer: Self-pay | Admitting: Internal Medicine

## 2018-05-30 ENCOUNTER — Telehealth: Payer: Self-pay

## 2018-05-30 NOTE — Telephone Encounter (Signed)
Tried initiating PA via Covermymeds; KEY: [id number]. PA already on file.   This medication or product was previously approved on [id number] from 2018-04-19 to 2019-03-21. **Please note: Formulary lowering, tiering exception, cost reduction and/or pre-benefit determination review (including prospective Medicare hospice reviews) requests cannot be requested using this method of submission. Please contact us at [phone] instead

## 2018-07-04 ENCOUNTER — Other Ambulatory Visit: Payer: Self-pay | Admitting: Internal Medicine

## 2018-07-04 ENCOUNTER — Ambulatory Visit: Payer: Medicare Other | Admitting: *Deleted

## 2018-08-11 ENCOUNTER — Encounter: Payer: Self-pay | Admitting: *Deleted

## 2018-08-11 ENCOUNTER — Ambulatory Visit (INDEPENDENT_AMBULATORY_CARE_PROVIDER_SITE_OTHER): Payer: Medicare Other | Admitting: *Deleted

## 2018-08-11 ENCOUNTER — Other Ambulatory Visit: Payer: Self-pay

## 2018-08-11 DIAGNOSIS — Z0000 Encounter for general adult medical examination without abnormal findings: Secondary | ICD-10-CM

## 2018-08-11 NOTE — Progress Notes (Addendum)
Virtual Visit via Video Note  I connected with patient on 08/11/18 at  2:00 PM EDT by a video enabled telemedicine application and verified that I am speaking with the correct person using two identifiers.   THIS ENCOUNTER IS A VIRTUAL VISIT DUE TO COVID-19 - PATIENT WAS NOT SEEN IN THE OFFICE. PATIENT HAS CONSENTED TO VIRTUAL VISIT / TELEMEDICINE VISIT   Location of patient: home  Location of provider: office  I discussed the limitations of evaluation and management by telemedicine and the availability of in person appointments. The patient expressed understanding and agreed to proceed.    Subjective:   Casey Bryan is a 73 y.o. female who presents for Medicare Annual (Subsequent) preventive examination.  Review of Systems: No ROS.  Medicare Wellness Visit. Additional risk factors are reflected in the social history. Cardiac Risk Factors include: advanced age (>4mn, >>23women);diabetes mellitus;dyslipidemia Sleep patterns: Sleeps 6-8 hrs. Home Safety/Smoke Alarms: Feels safe in home. Smoke alarms in place.  Lives with husband in 2 story home. Walk in shower.   Female:       Mammo- 03/09/18    Dexa scan- 12/25/15       CCS- 09/11/15.     Objective:     Vitals: Unable to assess. This visit is enabled though telemedicine due to Covid 19. Pt reports CBG 130.  Tobacco Social History   Tobacco Use  Smoking Status Never Smoker  Smokeless Tobacco Never Used     Counseling given: Not Answered   Clinical Intake: Pain : No/denies pain    Past Medical History:  Diagnosis Date  . Amaurosis fugax 12/12/2015  . Diabetes mellitus   . GERD (gastroesophageal reflux disease)   . Glaucoma 04-2012  . Hyperlipemia   . Hypertension    Past Surgical History:  Procedure Laterality Date  . NO PAST SURGERIES     Family History  Problem Relation Age of Onset  . Diabetes Mother        uncles, mother , father   . Coronary artery disease Other        F age 72 B age 72 uncle  age 72: MI  . Heart attack Brother 543 . Breast cancer Sister   . Multiple myeloma Sister   . Colon cancer Neg Hx    Social History   Socioeconomic History  . Marital status: Married    Spouse name: Not on file  . Number of children: 3  . Years of education: Not on file  . Highest education level: Not on file  Occupational History  . Occupation: semi-retired,own a busines, aOptometrist Social Needs  . Financial resource strain: Not on file  . Food insecurity:    Worry: Not on file    Inability: Not on file  . Transportation needs:    Medical: Not on file    Non-medical: Not on file  Tobacco Use  . Smoking status: Never Smoker  . Smokeless tobacco: Never Used  Substance and Sexual Activity  . Alcohol use: Yes    Comment: socially  . Drug use: No  . Sexual activity: Not on file  Lifestyle  . Physical activity:    Days per week: Not on file    Minutes per session: Not on file  . Stress: Not on file  Relationships  . Social connections:    Talks on phone: Not on file    Gets together: Not on file    Attends religious service: Not on file  Active member of club or organization: Not on file    Attends meetings of clubs or organizations: Not on file    Relationship status: Not on file  Other Topics Concern  . Not on file  Social History Narrative   Original [place]     Outpatient Encounter Medications as of 08/11/2018  Medication Sig  . aspirin EC 81 MG tablet Take 81 mg by mouth daily.  Marland Kitchen atorvastatin (LIPITOR) 80 MG tablet Take 1 tablet (80 mg total) by mouth daily.  . Blood Glucose Monitoring Suppl (ADVOCATE REDI-CODE) [place] by Does not apply route. Reported on 04/22/2015  . Calcium Carbonate-Vit D-Min (CALCIUM 1200 PO) Take 1,000 mg by mouth.   Marland Kitchen glucose blood (ONE TOUCH ULTRA TEST) test strip CHECK BLOOD SUGAR NO MORE THAN TWICE DAILY  . irbesartan-hydrochlorothiazide (AVALIDE) 300-12.5 MG tablet Take 1 tablet by mouth daily.  Marland Kitchen lidocaine (XYLOCAINE) 5 %  ointment Apply 1 application topically 2 (two) times daily as needed.   . metFORMIN (GLUCOPHAGE) 1000 MG tablet Take 1 tablet (1,000 mg total) by mouth daily with breakfast.  . metoprolol tartrate (LOPRESSOR) 50 MG tablet Take 2 tablets (100 mg total) by mouth 2 (two) times daily. (BETA BLOCKER)  . Multiple Vitamin (MULTIVITAMIN) tablet Take 1 tablet by mouth daily.    . Multiple Vitamins-Minerals (EYE VITAMINS) CAPS Take 1 capsule by mouth daily.  Marland Kitchen NEXIUM 40 MG capsule Take 1 capsule (40 mg total) by mouth daily.  Glory Rosebush DELICA LANCETS 48G MISC USE TO CHECK BLOOD SUGARS NO MORE THAN TWICE DAILY  . [DISCONTINUED] benzonatate (TESSALON) 100 MG capsule Take 1 capsule (100 mg total) by mouth 2 (two) times daily as needed for cough.  . [DISCONTINUED] doxycycline (VIBRA-TABS) 100 MG tablet Take 100 mg by mouth 2 (two) times daily.  . [DISCONTINUED] promethazine-dextromethorphan (PROMETHAZINE-DM) 6.25-15 MG/5ML syrup Take 5 mLs by mouth every 6 (six) hours as needed for cough.  . [DISCONTINUED] ranitidine (ZANTAC) 150 MG capsule Take 150 mg by mouth daily as needed.     No facility-administered encounter medications on file as of 08/11/2018.     Activities of Daily Living In your present state of health, do you have any difficulty performing the following activities: 08/11/2018 03/28/2018  Hearing? N N  Vision? N N  Difficulty concentrating or making decisions? N N  Walking or climbing stairs? N N  Dressing or bathing? N N  Doing errands, shopping? N N  Preparing Food and eating ? N -  Using the Toilet? N -  In the past six months, have you accidently leaked urine? N -  Do you have problems with loss of bowel control? N -  Managing your Medications? N -  Managing your Finances? N -  Housekeeping or managing your Housekeeping? N -  Some recent data might be hidden    Patient Care Team: Colon Branch, MD as PCP - Loman Brooklyn, Stephan Minister, DO as Consulting Physician (Neurology) Calvert Cantor, MD as Consulting Physician (Ophthalmology) Laurence Spates, MD as Consulting Physician (Gastroenterology)    Assessment:   This is a routine wellness examination for Casey Bryan. Physical assessment deferred to PCP.  Exercise Activities and Dietary recommendations Current Exercise Habits: The patient does not participate in regular exercise at present, Exercise limited by: None identified Diet (meal preparation, eat out, water intake, caffeinated beverages, dairy products, fruits and vegetables): in general, a "healthy" diet  , well balanced  Goals    . Increase physical activity  Fall Risk Fall Risk  08/11/2018 03/08/2018 03/23/2017 03/02/2017 03/10/2016  Falls in the past year? 0 0 No No No  Follow up - Falls evaluation completed - - -   Depression Screen PHQ 2/9 Scores 08/11/2018 03/28/2018 03/23/2017 03/10/2016  PHQ - 2 Score 0 0 0 0  PHQ- 9 Score - 2 - -     Cognitive Function Ad8 score reviewed for issues:  Issues making decisions:no  Less interest in hobbies / activities:no  Repeats questions, stories (family complaining):no  Trouble using ordinary gadgets (microwave, computer, phone):no  Forgets the month or year: no  Mismanaging finances: no  Remembering appts:no  Daily problems with thinking and/or memory:no Ad8 score is=0         Immunization History  Administered Date(s) Administered  . Influenza Whole 03/07/2007, 01/23/2008, 02/09/2010  . Influenza, High Dose Seasonal PF 01/18/2017, 03/06/2018  . Influenza,inj,Quad PF,6+ Mos 02/13/2013, 04/22/2015  . Pneumococcal Conjugate-13 12/26/2013  . Pneumococcal Polysaccharide-23 03/07/2007, 03/21/2013  . Td 02/09/2010   Screening Tests Health Maintenance  Topic Date Due  . FOOT EXAM  07/08/2017  . HEMOGLOBIN A1C  09/27/2018  . INFLUENZA VACCINE  11/18/2018  . OPHTHALMOLOGY EXAM  12/09/2018  . MAMMOGRAM  03/09/2019  . COLONOSCOPY  09/10/2020  . DEXA SCAN  Completed  . Hepatitis C Screening   Completed  . PNA vac Low Risk Adult  Completed       Plan:   See you next year!  Continue to eat heart healthy diet (full of fruits, vegetables, whole grains, lean protein, water--limit salt, fat, and sugar intake) and increase physical activity as tolerated.  Continue doing brain stimulating activities (puzzles, reading, adult coloring books, staying active) to keep memory sharp.    I have personally reviewed and noted the following in the patient's chart:   . Medical and social history . Use of alcohol, tobacco or illicit drugs  . Current medications and supplements . Functional ability and status . Nutritional status . Physical activity . Advanced directives . List of other physicians . Hospitalizations, surgeries, and ER visits in previous 12 months . Vitals . Screenings to include cognitive, depression, and falls . Referrals and appointments  In addition, I have reviewed and discussed with patient certain preventive protocols, quality metrics, and best practice recommendations. A written personalized care plan for preventive services as well as general preventive health recommendations were provided to patient.     Naaman Plummer [place], [place]  08/11/2018   Kathlene November, MD

## 2018-08-11 NOTE — Patient Instructions (Addendum)
See you next year!  Continue to eat heart healthy diet (full of fruits, vegetables, whole grains, lean protein, water--limit salt, fat, and sugar intake) and increase physical activity as tolerated.  Continue doing brain stimulating activities (puzzles, reading, adult coloring books, staying active) to keep memory sharp.    Casey Bryan , Thank you for taking time to come for your Medicare Wellness Visit. I appreciate your ongoing commitment to your health goals. Please review the following plan we discussed and let me know if I can assist you in the future.   These are the goals we discussed: Goals    . Increase physical activity       This is a list of the screening recommended for you and due dates:  Health Maintenance  Topic Date Due  . Complete foot exam   07/08/2017  . Hemoglobin A1C  09/27/2018  . Flu Shot  11/18/2018  . Eye exam for diabetics  12/09/2018  . Mammogram  03/09/2019  . Colon Cancer Screening  09/10/2020  . DEXA scan (bone density measurement)  Completed  .  Hepatitis C: One time screening is recommended by Center for Disease Control  (CDC) for  adults born from [birth year] through 1964.   Completed  . Pneumonia vaccines  Completed    Health Maintenance After Age 42 After age 64, you are at a higher risk for certain long-term diseases and infections as well as injuries from falls. Falls are a major cause of broken bones and head injuries in people who are older than age 84. Getting regular preventive care can help to keep you healthy and well. Preventive care includes getting regular testing and making lifestyle changes as recommended by your health care provider. Talk with your health care provider about:  Which screenings and tests you should have. A screening is a test that checks for a disease when you have no symptoms.  A diet and exercise plan that is right for you. What should I know about screenings and tests to prevent falls? Screening and testing are the best  ways to find a health problem early. Early diagnosis and treatment give you the best chance of managing medical conditions that are common after age 63. Certain conditions and lifestyle choices may make you more likely to have a fall. Your health care provider may recommend:  Regular vision checks. Poor vision and conditions such as cataracts can make you more likely to have a fall. If you wear glasses, make sure to get your prescription updated if your vision changes.  Medicine review. Work with your health care provider to regularly review all of the medicines you are taking, including over-the-counter medicines. Ask your health care provider about any side effects that may make you more likely to have a fall. Tell your health care provider if any medicines that you take make you feel dizzy or sleepy.  Osteoporosis screening. Osteoporosis is a condition that causes the bones to get weaker. This can make the bones weak and cause them to break more easily.  Blood pressure screening. Blood pressure changes and medicines to control blood pressure can make you feel dizzy.  Strength and balance checks. Your health care provider may recommend certain tests to check your strength and balance while standing, walking, or changing positions.  Foot health exam. Foot pain and numbness, as well as not wearing proper footwear, can make you more likely to have a fall.  Depression screening. You may be more likely to have a fall  if you have a fear of falling, feel emotionally low, or feel unable to do activities that you used to do.  Alcohol use screening. Using too much alcohol can affect your balance and may make you more likely to have a fall. What actions can I take to lower my risk of falls? General instructions  Talk with your health care provider about your risks for falling. Tell your health care provider if: ? You fall. Be sure to tell your health care provider about all falls, even ones that seem  minor. ? You feel dizzy, sleepy, or off-balance.  Take over-the-counter and prescription medicines only as told by your health care provider. These include any supplements.  Eat a healthy diet and maintain a healthy weight. A healthy diet includes low-fat dairy products, low-fat (lean) meats, and fiber from whole grains, beans, and lots of fruits and vegetables. Home safety  Remove any tripping hazards, such as rugs, cords, and clutter.  Install safety equipment such as grab bars in bathrooms and safety rails on stairs.  Keep rooms and walkways well-lit. Activity   Follow a regular exercise program to stay fit. This will help you maintain your balance. Ask your health care provider what types of exercise are appropriate for you.  If you need a cane or walker, use it as recommended by your health care provider.  Wear supportive shoes that have nonskid soles. Lifestyle  Do not drink alcohol if your health care provider tells you not to drink.  If you drink alcohol, limit how much you have: ? 0-1 drink a day for women. ? 0-2 drinks a day for men.  Be aware of how much alcohol is in your drink. In the [place] one drink equals one typical bottle of beer (12 oz), one-half glass of wine (5 oz), or one shot of hard liquor (1 oz).  Do not use any products that contain nicotine or tobacco, such as cigarettes and e-cigarettes. If you need help quitting, ask your health care provider. Summary  Having a healthy lifestyle and getting preventive care can help to protect your health and wellness after age 36.  Screening and testing are the best way to find a health problem early and help you avoid having a fall. Early diagnosis and treatment give you the best chance for managing medical conditions that are more common for people who are older than age 79.  Falls are a major cause of broken bones and head injuries in people who are older than age 23. Take precautions to prevent a fall at  home.  Work with your health care provider to learn what changes you can make to improve your health and wellness and to prevent falls. This information is not intended to replace advice given to you by your health care provider. Make sure you discuss any questions you have with your health care provider. Document Released: 02/16/2017 Document Revised: 02/16/2017 Document Reviewed: 02/16/2017 Elsevier Interactive Patient Education  2019 [employer].

## 2018-08-15 ENCOUNTER — Ambulatory Visit: Payer: Medicare Other | Admitting: *Deleted

## 2018-09-25 ENCOUNTER — Other Ambulatory Visit: Payer: Self-pay | Admitting: Internal Medicine

## 2018-09-27 ENCOUNTER — Ambulatory Visit: Payer: Medicare Other | Admitting: Internal Medicine

## 2018-10-31 ENCOUNTER — Other Ambulatory Visit: Payer: Self-pay | Admitting: Internal Medicine

## 2018-11-16 ENCOUNTER — Encounter: Payer: Self-pay | Admitting: Internal Medicine

## 2018-12-31 ENCOUNTER — Other Ambulatory Visit: Payer: Self-pay | Admitting: Internal Medicine

## 2019-01-01 ENCOUNTER — Other Ambulatory Visit: Payer: Self-pay | Admitting: Internal Medicine

## 2019-01-13 ENCOUNTER — Other Ambulatory Visit: Payer: Self-pay | Admitting: Internal Medicine

## 2019-01-15 ENCOUNTER — Other Ambulatory Visit: Payer: Self-pay | Admitting: Internal Medicine

## 2019-01-18 ENCOUNTER — Other Ambulatory Visit: Payer: Self-pay | Admitting: Internal Medicine

## 2019-01-23 ENCOUNTER — Other Ambulatory Visit: Payer: Self-pay | Admitting: Internal Medicine

## 2019-01-23 DIAGNOSIS — Z1231 Encounter for screening mammogram for malignant neoplasm of breast: Secondary | ICD-10-CM

## 2019-02-02 ENCOUNTER — Other Ambulatory Visit: Payer: Self-pay | Admitting: Internal Medicine

## 2019-02-12 ENCOUNTER — Other Ambulatory Visit: Payer: Self-pay | Admitting: Internal Medicine

## 2019-02-14 ENCOUNTER — Other Ambulatory Visit: Payer: Self-pay

## 2019-02-15 ENCOUNTER — Ambulatory Visit (INDEPENDENT_AMBULATORY_CARE_PROVIDER_SITE_OTHER): Payer: Medicare Other | Admitting: Internal Medicine

## 2019-02-15 ENCOUNTER — Other Ambulatory Visit: Payer: Self-pay

## 2019-02-15 ENCOUNTER — Encounter: Payer: Self-pay | Admitting: Internal Medicine

## 2019-02-15 VITALS — BP 186/64 | HR 59 | Temp 97.5°F | Resp 16 | Ht 64.0 in | Wt 220.0 lb

## 2019-02-15 DIAGNOSIS — D649 Anemia, unspecified: Secondary | ICD-10-CM

## 2019-02-15 DIAGNOSIS — I10 Essential (primary) hypertension: Secondary | ICD-10-CM | POA: Diagnosis not present

## 2019-02-15 DIAGNOSIS — E119 Type 2 diabetes mellitus without complications: Secondary | ICD-10-CM

## 2019-02-15 DIAGNOSIS — Z23 Encounter for immunization: Secondary | ICD-10-CM | POA: Diagnosis not present

## 2019-02-15 DIAGNOSIS — M722 Plantar fascial fibromatosis: Secondary | ICD-10-CM

## 2019-02-15 DIAGNOSIS — E785 Hyperlipidemia, unspecified: Secondary | ICD-10-CM

## 2019-02-15 DIAGNOSIS — Z794 Long term (current) use of insulin: Secondary | ICD-10-CM

## 2019-02-15 MED ORDER — IRBESARTAN-HYDROCHLOROTHIAZIDE 300-12.5 MG PO TABS: 1.0000 | ORAL_TABLET | Freq: Every day | ORAL | 1 refills | Status: DC

## 2019-02-15 MED ORDER — ATORVASTATIN CALCIUM 80 MG PO TABS: 80.0000 mg | ORAL_TABLET | Freq: Every day | ORAL | 1 refills | Status: DC

## 2019-02-15 MED ORDER — NEXIUM 40 MG PO CPDR: 40.0000 mg | DELAYED_RELEASE_CAPSULE | Freq: Every day | ORAL | 3 refills | Status: DC

## 2019-02-15 MED ORDER — METOPROLOL TARTRATE 50 MG PO TABS: 100.0000 mg | ORAL_TABLET | Freq: Two times a day (BID) | ORAL | 1 refills | Status: DC

## 2019-02-15 MED ORDER — METFORMIN HCL 1000 MG PO TABS: 1000.0000 mg | ORAL_TABLET | Freq: Every day | ORAL | 1 refills | Status: DC

## 2019-02-15 NOTE — Progress Notes (Signed)
Pre visit review using our clinic review tool, if applicable. No additional management support is needed unless otherwise documented below in the visit note. 

## 2019-02-15 NOTE — Patient Instructions (Addendum)
Per our records you are due for an eye exam. Please contact your eye doctor to schedule an appointment. Please have them send copies of your office visit notes to [place]. Our fax number is [phone].   GO TO THE LAB : Get the blood work     GO TO THE FRONT DESK Schedule your next appointment   for checkup in 6 months  Check your blood pressure every day for 2 weeks, call with your readings in 2 weeks.  We might need to adjust your medication.  BP GOAL is between 110/65 and  135/85.   HOW TO TAKE YOUR BLOOD PRESSURE:   Rest 5 minutes before taking your blood pressure.   Don't smoke or drink caffeinated beverages for at least 30 minutes before.   Take your blood pressure before (not after) you eat.   Sit comfortably with your back supported and both feet on the floor (don't cross your legs).   Elevate your arm to heart level on a table or a desk.   Use the proper sized cuff. It should fit smoothly and snugly around your bare upper arm. There should be enough room to slip a fingertip under the cuff. The bottom edge of the cuff should be 1 inch above the crease of the elbow.   Ideally, take 3 measurements at one sitting and record the average.

## 2019-02-15 NOTE — Progress Notes (Signed)
Subjective:    Patient ID: Casey Bryan, female    DOB: 04-28-1945, 73 y.o.   MRN: [id number]  DOS:  02/15/2019 Type of visit - description: Routine office visit HTN: BP today slightly elevated, at home systolic BP is sometimes elevated.  Diastolic BP consistently normal DM: Due for labs High cholesterol: Due for labs, good med compliance Also, complaining of left heel pain, at the plantar area, when she puts weight, sx started ~ 4 days ago.  This is the first time she had something like that, denies any fall or injury.  BP Readings from Last 3 Encounters:  02/15/19 (!) 186/64  03/28/18 124/82  03/08/18 134/64     Review of Systems No chest pain no difficulty breathing No nausea, vomiting, diarrhea. No cough   Past Medical History:  Diagnosis Date  . Amaurosis fugax 12/12/2015  . Diabetes mellitus   . GERD (gastroesophageal reflux disease)   . Glaucoma 04-2012  . Hyperlipemia   . Hypertension     Past Surgical History:  Procedure Laterality Date  . NO PAST SURGERIES      Social History   Socioeconomic History  . Marital status: Married    Spouse name: Not on file  . Number of children: 3  . Years of education: Not on file  . Highest education level: Not on file  Occupational History  . Occupation: semi-retired,own a busines, Optometrist  Social Needs  . Financial resource strain: Not on file  . Food insecurity    Worry: Not on file    Inability: Not on file  . Transportation needs    Medical: Not on file    Non-medical: Not on file  Tobacco Use  . Smoking status: Never Smoker  . Smokeless tobacco: Never Used  Substance and Sexual Activity  . Alcohol use: Yes    Comment: socially  . Drug use: No  . Sexual activity: Not on file  Lifestyle  . Physical activity    Days per week: Not on file    Minutes per session: Not on file  . Stress: Not on file  Relationships  . Social Herbalist on phone: Not on file    Gets together: Not on file   Attends religious service: Not on file    Active member of club or organization: Not on file    Attends meetings of clubs or organizations: Not on file    Relationship status: Not on file  . Intimate partner violence    Fear of current or ex partner: Not on file    Emotionally abused: Not on file    Physically abused: Not on file    Forced sexual activity: Not on file  Other Topics Concern  . Not on file  Social History Narrative   Original [place]       Allergies as of 02/15/2019      Reactions   Prevnar [pneumococcal 13-val Conj Vacc] Nausea And Vomiting, Other (See Comments)   Fever of 102.    Clarithromycin [clarithromycin]    Contradictions w/Vytorin   Penicillins    Tetanus-diphtheria Toxoids Td    REACTION: see OV note  02-11-10      Medication List       Accurate as of February 15, 2019  1:48 PM. If you have any questions, ask your nurse or doctor.        [place]ocate Redi-Code [place] by Does not apply route. Reported on 04/22/2015   aspirin EC  81 MG tablet Take 81 mg by mouth daily.   atorvastatin 80 MG tablet Commonly known as: LIPITOR Take 1 tablet (80 mg total) by mouth daily.   CALCIUM 1200 PO Take 1,000 mg by mouth.   Eye Vitamins Caps Take 1 capsule by mouth daily.   glucose blood test strip Commonly known as: ONE TOUCH ULTRA TEST CHECK BLOOD SUGAR NO MORE THAN TWICE DAILY   irbesartan-hydrochlorothiazide 300-12.5 MG tablet Commonly known as: AVALIDE Take 1 tablet by mouth daily.   lidocaine 5 % ointment Commonly known as: XYLOCAINE Apply 1 application topically 2 (two) times daily as needed.   metFORMIN 1000 MG tablet Commonly known as: GLUCOPHAGE Take 1 tablet (1,000 mg total) by mouth daily with breakfast.   metoprolol tartrate 50 MG tablet Commonly known as: LOPRESSOR Take 2 tablets (100 mg total) by mouth 2 (two) times daily.   multivitamin tablet Take 1 tablet by mouth daily.   NexIUM 40 MG capsule Generic drug: esomeprazole  Take 1 capsule (40 mg total) by mouth daily.   OneTouch Delica Lancets 99991111 Misc USE TO CHECK BLOOD SUGARS NO MORE THAN TWICE DAILY           Objective:   Physical Exam BP (!) 186/64 (BP Location: Left Arm, Patient Position: Sitting, Cuff Size: Normal)   Pulse (!) 59   Temp (!) 97.5 F (36.4 C) (Temporal)   Resp 16   Ht 5\' 4"  (1.626 m)   Wt 220 lb (99.8 kg)   SpO2 98%   BMI 37.76 kg/m  General:   Well developed, NAD, BMI noted.  HEENT:  Normocephalic . Face symmetric, atraumatic Lungs:  CTA B Normal respiratory effort, no intercostal retractions, no accessory muscle use. Heart: RRR,  no murmur.  no pretibial edema bilaterally  Abdomen:  Not distended, soft, non-tender. No rebound or rigidity.   Foot exam: Skin normal, pedal pulses normal, pinprick normal. MSK: + TTP at the base of the left plantar area of the heel. Neurologic:  alert & oriented X3.  Speech normal, gait appropriate for age and unassisted Psych--  Cognition and judgment appear intact.  Cooperative with normal attention span and concentration.  Behavior appropriate. No anxious or depressed appearing.     Assessment    Assessment: DM HTN (intolerant amlodipine 11-2015 and 2018) Hyperlipidemia Glaucoma dx 2014, cataracts NEURO: L amaurosix fugax 03-2015, CVA -- saw opthalmology, started ASA  (02-2015); echo 04-09-15: nl EF, grade 2 Diast disfx -- saw neuro: Probable cryptogenic  --Headaches: Sees  neurology   CV: Carotid artery disease:   [place] 04-2015: Progression of the right ICA now 40-59%. Stable left ICA 1-39%. GI: -GERD -Gastri polyp removal 08-2015, Dr Oletta Lamas  PLAN: DM: Currently on metformin, check A1c.  Diet and exercise encouraged HTN: Intolerant to amlodipine, currently on Avalide, metoprolol.  Systolic BP slightly elevated on and off at home.  Recommend to check ambulatory BPs, call with a log in 2 weeks.  Consider increase HCTZ from 12.5 to 25 mg.  Check a CMP High cholesterol: On  Lipitor, fasting for 6 hours, check FLP Carotid artery disease: Next [place] due in few weeks Iron deficiency anemia: We will get EGD report from  2019, check CBC, iron, ferritin.  Take iron supplements inconsistently. Preventive care: Flu shot today, declined Shingrix. Plantar fasciitis: Stretching discussed.  Icing. RTC, 6 months

## 2019-02-16 LAB — LIPID PANEL
Cholesterol: 140 mg/dL (ref 0–200)
HDL: 50.5 mg/dL (ref >39.00)
LDL Cholesterol: 69 mg/dL (ref 0–99)
NonHDL: 89.23
Total CHOL/HDL Ratio: 3
Triglycerides: 99 mg/dL (ref 0.0–149.0)
VLDL: 19.8 mg/dL (ref 0.0–40.0)

## 2019-02-16 LAB — CBC WITH DIFFERENTIAL/PLATELET
Basophils Absolute: 0.1 10*3/uL (ref 0.0–0.1)
Basophils Relative: 1 % (ref 0.0–3.0)
Eosinophils Absolute: 0.1 10*3/uL (ref 0.0–0.7)
Eosinophils Relative: 2.2 % (ref 0.0–5.0)
HCT: 35 % — ABNORMAL LOW (ref 36.0–46.0)
Hemoglobin: 11.5 g/dL — ABNORMAL LOW (ref 12.0–15.0)
Lymphocytes Relative: 32.4 % (ref 12.0–46.0)
Lymphs Abs: 1.6 10*3/uL (ref 0.7–4.0)
MCHC: 33 g/dL (ref 30.0–36.0)
MCV: 84.7 fl (ref 78.0–100.0)
Monocytes Absolute: 0.4 10*3/uL (ref 0.1–1.0)
Monocytes Relative: 8.6 % (ref 3.0–12.0)
Neutro Abs: 2.8 10*3/uL (ref 1.4–7.7)
Neutrophils Relative %: 55.8 % (ref 43.0–77.0)
Platelets: 196 10*3/uL (ref 150.0–400.0)
RBC: 4.13 Mil/uL (ref 3.87–5.11)
RDW: 15.3 % (ref 11.5–15.5)
WBC: 5 10*3/uL (ref 4.0–10.5)

## 2019-02-16 LAB — COMPREHENSIVE METABOLIC PANEL
ALT: 12 U/L (ref 0–35)
AST: 16 U/L (ref 0–37)
Albumin: 4.1 g/dL (ref 3.5–5.2)
Alkaline Phosphatase: 59 U/L (ref 39–117)
BUN: 18 mg/dL (ref 6–23)
CO2: 31 mEq/L (ref 19–32)
Calcium: 9 mg/dL (ref 8.4–10.5)
Chloride: 100 mEq/L (ref 96–112)
Creatinine, Ser: 0.71 mg/dL (ref 0.40–1.20)
GFR: 80.83 mL/min (ref >60.00)
Glucose, Bld: 84 mg/dL (ref 70–99)
Potassium: 4.1 mEq/L (ref 3.5–5.1)
Sodium: 139 mEq/L (ref 135–145)
Total Bilirubin: 0.6 mg/dL (ref 0.2–1.2)
Total Protein: 6.5 g/dL (ref 6.0–8.3)

## 2019-02-16 LAB — IRON: Iron: 92 ug/dL (ref 42–145)

## 2019-02-16 LAB — FERRITIN: Ferritin: 11.7 ng/mL (ref 10.0–291.0)

## 2019-02-16 LAB — HEMOGLOBIN A1C: Hgb A1c MFr Bld: 6.7 % — ABNORMAL HIGH (ref 4.6–6.5)

## 2019-02-17 NOTE — Assessment & Plan Note (Signed)
DM: Currently on metformin, check A1c.  Diet and exercise encouraged HTN: Intolerant to amlodipine, currently on Avalide, metoprolol.  Systolic BP slightly elevated on and off at home.  Recommend to check ambulatory BPs, call with a log in 2 weeks.  Consider increase HCTZ from 12.5 to 25 mg.  Check a CMP High cholesterol: On Lipitor, fasting for 6 hours, check FLP Carotid artery disease: Next [place] due in few weeks Iron deficiency anemia: We will get EGD report from  2019, check CBC, iron, ferritin.  Take iron supplements inconsistently. Preventive care: Flu shot today, declined Shingrix. Plantar fasciitis: Stretching discussed.  Icing. RTC, 6 months

## 2019-03-12 ENCOUNTER — Other Ambulatory Visit: Payer: Self-pay

## 2019-03-12 ENCOUNTER — Ambulatory Visit
Admission: RE | Admit: 2019-03-12 | Discharge: 2019-03-12 | Disposition: A | Discharge status: Home / Self Care | Payer: Medicare Other | Source: Ambulatory Visit | Attending: Internal Medicine | Admitting: Internal Medicine

## 2019-03-12 DIAGNOSIS — Z1231 Encounter for screening mammogram for malignant neoplasm of breast: Secondary | ICD-10-CM

## 2019-03-21 ENCOUNTER — Ambulatory Visit: Payer: Medicare Other | Admitting: Neurology

## 2019-04-19 ENCOUNTER — Telehealth: Payer: Self-pay | Admitting: Internal Medicine

## 2019-04-19 NOTE — Telephone Encounter (Signed)
Patient called to request a prior auth for her medication, NEXIUM 40 MG capsule, which the pharmacy told her to call the PCP.  Please advise.

## 2019-04-21 ENCOUNTER — Other Ambulatory Visit: Payer: Self-pay | Admitting: Internal Medicine

## 2019-04-23 NOTE — Telephone Encounter (Signed)
PA is PA stack to be  done

## 2019-04-24 ENCOUNTER — Other Ambulatory Visit: Payer: Self-pay | Admitting: Internal Medicine

## 2019-04-24 MED ORDER — NEXIUM 40 MG PO CPDR: 40.0000 mg | DELAYED_RELEASE_CAPSULE | Freq: Every day | ORAL | 3 refills | Status: DC

## 2019-04-24 NOTE — Telephone Encounter (Signed)
Requested Prescriptions  Pending Prescriptions Disp Refills  . NEXIUM 40 MG capsule 90 capsule 3    Sig: Take 1 capsule (40 mg total) by mouth daily.     Gastroenterology: Proton Pump Inhibitors Passed - 04/24/2019 11:42 AM      Passed - Valid encounter within last 12 months    Recent Outpatient Visits          2 months ago Essential hypertension   [facility], [place]   1 year ago Essential hypertension   [facility] [place], [place]   1 year ago Viral URI with cough   [facility], [place]   1 year ago Hypertension, unspecified type   [facility] [place], [place]   2 years ago Hyperlipidemia, unspecified hyperlipidemia type   [facility] [place], [place]      Future Appointments            In 3 months Vevelyn Royals, Parthenia Ames, [facility], [place]   In 3 months Larose Kells, Alda Berthold, MD [facility], [place]

## 2019-04-24 NOTE — Telephone Encounter (Signed)
Medication Refill - Medication: NEXIUM 40 MG capsule [id number]     Preferred Pharmacy (with phone number or street name):  [facility] [id number] - [place], [place] - [address] [place] [address]  [address] [place] [address]  Phone: [phone] Fax: [phone]     Agent: Please be advised that RX refills may take up to 3 business days. We ask that you follow-up with your pharmacy.

## 2019-04-25 ENCOUNTER — Telehealth: Payer: Self-pay | Admitting: *Deleted

## 2019-04-25 DIAGNOSIS — T8069XA Other serum reaction due to other serum, initial encounter: Secondary | ICD-10-CM

## 2019-04-25 NOTE — Telephone Encounter (Signed)
Patient daughter stated that patient would like to get the vaccine, but patient has some concern.  In the past she has had reactions to the Tetanus and pneumonia vaccine.  So when it come time that she can get the covid vaccine, what are your recommendations?

## 2019-04-25 NOTE — Telephone Encounter (Signed)
NEXUM prior auth submitted over phone  PA# [id number]

## 2019-04-25 NOTE — Telephone Encounter (Signed)
In 2011, the patient received both a flu shot and a Td shot. Within 24 hours she had fever up to 103, nausea, the area where the Td was applied (L shoulder) was sore, red and slightly swollen. The whole left shoulder was hurting. The area where the flu shot was apply seems okay. Advise patient: We need the opinion of an allergist. Please refer her to an allergist ASAP, DX allergy to immunizations.  Could she take the Covid vaccine?. I think that a virtual visit will be sufficient.

## 2019-04-26 NOTE — Telephone Encounter (Signed)
Spoke with daughter and she would like the referral to made to Dr. Harold Hedge at [facility]

## 2019-04-27 ENCOUNTER — Telehealth: Payer: Self-pay

## 2019-04-27 ENCOUNTER — Encounter: Payer: Self-pay | Admitting: Internal Medicine

## 2019-04-27 ENCOUNTER — Ambulatory Visit: Payer: Medicare Other | Attending: Internal Medicine

## 2019-04-27 DIAGNOSIS — Z20822 Contact with and (suspected) exposure to covid-19: Secondary | ICD-10-CM | POA: Diagnosis not present

## 2019-04-27 MED ORDER — ESOMEPRAZOLE MAGNESIUM 40 MG PO CPDR: 40.0000 mg | DELAYED_RELEASE_CAPSULE | Freq: Every day | ORAL | 3 refills | Status: DC

## 2019-04-27 MED ORDER — DOXYCYCLINE HYCLATE 100 MG PO TABS: 100.0000 mg | ORAL_TABLET | Freq: Two times a day (BID) | ORAL | 0 refills | Status: DC

## 2019-04-27 NOTE — Telephone Encounter (Signed)
Spoke with the daughter, she requested an antibiotic, doxycycline sent Also she verbalized good understanding of the instructions I sent

## 2019-04-27 NOTE — Telephone Encounter (Signed)
Prior auth cancelled, no reason per daughter and patient of why she cannot take the generic. They are ok with taking generic.  Generic sent in.

## 2019-04-27 NOTE — Telephone Encounter (Signed)
Copied from [place] [id number]. Topic: General - Inquiry >> Apr 27, 2019 11:25 AM Casey Bryan wrote: Reason for CRM: Patient called to inform DR Larose Kells that she is positive for covid 19

## 2019-04-27 NOTE — Addendum Note (Signed)
Addended by: Kem Boroughs D on: 04/27/2019 04:32 PM   Modules accepted: Orders

## 2019-04-27 NOTE — Addendum Note (Signed)
Addended by: Kathlene November E on: 04/27/2019 03:59 PM   Modules accepted: Orders

## 2019-04-27 NOTE — Telephone Encounter (Signed)
Please schedule a virtual visit if they so desired otherwise the treatment is: Rest Fluids Tylenol OTC Mucinex or Mucinex DM for cough If possible check the blood pressures  and the pulse oximeter daily  The most important advise is to pay attention to her symptoms, if she is feeling poorly, feeling worse, have high fever, chest pain, difficulty breathing, low blood pressure or the oxygen saturation goes below 94% : Go to the ER

## 2019-04-29 LAB — NOVEL CORONAVIRUS, NAA: SARS-CoV-2, NAA: DETECTED — AB

## 2019-04-30 ENCOUNTER — Telehealth: Payer: Self-pay | Admitting: Nurse Practitioner

## 2019-04-30 NOTE — Telephone Encounter (Signed)
Called to Discuss with patient about Covid symptoms and the use of bamlanivimab, a monoclonal antibody infusion for those with mild to moderate Covid symptoms and at a high risk of hospitalization.     Pt is qualified for this infusion at the [facility] due to co-morbid conditions and/or a member of an at-risk group.     Unable to reach pt  

## 2019-05-02 ENCOUNTER — Telehealth: Payer: Self-pay | Admitting: *Deleted

## 2019-05-02 NOTE — Telephone Encounter (Signed)
Sent over a referral to [facility] and they sent a fax back   "Thank you so much for referring your patient, Casey Bryan, to [place] for evaluation of potential allergic reaction to vaccine(s).  Unfortunately, we are unable to evatulate patients for any type of sensitivities or reactions to vaccines, including TB, flu, or COVID.  We would recommend considering a teaching hospitial setting, such as [facility] or [facility].  They may subspecialize in vaccine testing.  Thanks again for your referral and your consideration."

## 2019-05-07 NOTE — Telephone Encounter (Signed)
Advise patient, I discussed the issue with one of our infectious diseases doctors and they recommend her to proceed with the vaccination.  I agree.  Needs to let known the people that provides the vaccine that in the past she had a reaction to the shot.

## 2019-05-08 NOTE — Telephone Encounter (Signed)
Left message on machine for daughter to call me back.

## 2019-05-08 NOTE — Telephone Encounter (Signed)
Patient's daughter notified.

## 2019-08-10 NOTE — Progress Notes (Deleted)
Subjective:   Casey Bryan is a 74 y.o. female who presents for Medicare Annual (Subsequent) preventive examination.  Review of Systems:  Home Safety/Smoke Alarms: Feels safe in home. Smoke alarms in place.  Lives w/ husband in 2 story home.   Female:   Mammo-  03/12/19     Dexa scan-   2017     CCS-09/11/15    Objective:     Vitals: There were no vitals taken for this visit.  There is no height or weight on file to calculate BMI.  Advanced Directives 08/11/2018  Does Patient Have a Medical Advance Directive? No  Would patient like information on creating a medical advance directive? No - Patient declined    Tobacco Social History   Tobacco Use  Smoking Status Never Smoker  Smokeless Tobacco Never Used     Counseling given: Not Answered   Clinical Intake:                       Past Medical History:  Diagnosis Date  . Amaurosis fugax 12/12/2015  . Diabetes mellitus   . GERD (gastroesophageal reflux disease)   . Glaucoma 04-2012  . Hyperlipemia   . Hypertension    Past Surgical History:  Procedure Laterality Date  . NO PAST SURGERIES     Family History  Problem Relation Age of Onset  . Diabetes Mother        uncles, mother , father   . Coronary artery disease Other        F age 50, B age 40, uncle age 33 : MI  . Heart attack Brother 35  . Breast cancer Sister   . Multiple myeloma Sister   . Colon cancer Neg Hx    Social History   Socioeconomic History  . Marital status: Married    Spouse name: Not on file  . Number of children: 3  . Years of education: Not on file  . Highest education level: Not on file  Occupational History  . Occupation: semi-retired,own a busines, accountant  Tobacco Use  . Smoking status: Never Smoker  . Smokeless tobacco: Never Used  Substance and Sexual Activity  . Alcohol use: Yes    Comment: socially  . Drug use: No  . Sexual activity: Not on file  Other Topics Concern  . Not on file  Social History  Narrative   Original [place]    Social Determinants of Health   Financial Resource Strain:   . Difficulty of Paying Living Expenses:   Food Insecurity:   . Worried About Charity fundraiser in the Last Year:   . Arboriculturist in the Last Year:   Transportation Needs:   . Film/video editor (Medical):   Marland Kitchen Lack of Transportation (Non-Medical):   Physical Activity:   . Days of Exercise per Week:   . Minutes of Exercise per Session:   Stress:   . Feeling of Stress :   Social Connections:   . Frequency of Communication with Friends and Family:   . Frequency of Social Gatherings with Friends and Family:   . Attends Religious Services:   . Active Member of Clubs or Organizations:   . Attends Archivist Meetings:   Marland Kitchen Marital Status:     Outpatient Encounter Medications as of 08/13/2019  Medication Sig  . aspirin EC 81 MG tablet Take 81 mg by mouth daily.  Marland Kitchen atorvastatin (LIPITOR) 80 MG tablet Take 1  tablet (80 mg total) by mouth daily.  . Blood Glucose Monitoring Suppl (ADVOCATE REDI-CODE) DEVI by Does not apply route. Reported on 04/22/2015  . Calcium Carbonate-Vit D-Min (CALCIUM 1200 PO) Take 1,000 mg by mouth.   . doxycycline (VIBRA-TABS) 100 MG tablet Take 1 tablet (100 mg total) by mouth 2 (two) times daily.  Marland Kitchen esomeprazole (NEXIUM) 40 MG capsule Take 1 capsule (40 mg total) by mouth daily.  Marland Kitchen glucose blood (ONETOUCH ULTRA) test strip CHECK BLOOD SUGAR NO MORE THAN TWICE DAILY  . irbesartan-hydrochlorothiazide (AVALIDE) 300-12.5 MG tablet Take 1 tablet by mouth daily.  Marland Kitchen lidocaine (XYLOCAINE) 5 % ointment Apply 1 application topically 2 (two) times daily as needed.   . metFORMIN (GLUCOPHAGE) 1000 MG tablet Take 1 tablet (1,000 mg total) by mouth daily with breakfast.  . metoprolol tartrate (LOPRESSOR) 50 MG tablet Take 2 tablets (100 mg total) by mouth 2 (two) times daily.  . Multiple Vitamin (MULTIVITAMIN) tablet Take 1 tablet by mouth daily.    . Multiple  Vitamins-Minerals (EYE VITAMINS) CAPS Take 1 capsule by mouth daily.  Glory Rosebush Delica Lancets 62B MISC USE TO CHECK BLOOD SUGARS NO MORE THAN TWICE DAILY   No facility-administered encounter medications on file as of 08/13/2019.    Activities of Daily Living In your present state of health, do you have any difficulty performing the following activities: 08/11/2018  Hearing? N  Vision? N  Difficulty concentrating or making decisions? N  Walking or climbing stairs? N  Dressing or bathing? N  Doing errands, shopping? N  Preparing Food and eating ? N  Using the Toilet? N  In the past six months, have you accidently leaked urine? N  Do you have problems with loss of bowel control? N  Managing your Medications? N  Managing your Finances? N  Housekeeping or managing your Housekeeping? N  Some recent data might be hidden    Patient Care Team: Colon Branch, MD as PCP - Almond Lint, DO as Consulting Physician (Neurology) Calvert Cantor, MD as Consulting Physician (Ophthalmology) Laurence Spates, MD (Inactive) as Consulting Physician (Gastroenterology)    Assessment:   This is a routine wellness examination for Casey Bryan. Physical assessment deferred to PCP.  Exercise Activities and Dietary recommendations   Diet (meal preparation, eat out, water intake, caffeinated beverages, dairy products, fruits and vegetables): {Desc; diets:16563} Breakfast: Lunch:  Dinner:      Goals    . Increase physical activity       Fall Risk Fall Risk  08/11/2018 03/08/2018 03/23/2017 03/02/2017 03/10/2016  Falls in the past year? 0 0 No No No  Follow up - Falls evaluation completed - - -     Depression Screen PHQ 2/9 Scores 08/11/2018 03/28/2018 03/23/2017 03/10/2016  PHQ - 2 Score 0 0 0 0  PHQ- 9 Score - 2 - -     Cognitive Function   Ad8 score reviewed for issues:  Issues making decisions:  Less interest in hobbies / activities:  Repeats questions, stories (family  complaining):  Trouble using ordinary gadgets (microwave, computer, phone):  Forgets the month or year:   Mismanaging finances:   Remembering appts:  Daily problems with thinking and/or memory: Ad8 score is=          Immunization History  Administered Date(s) Administered  . Fluad Quad(high Dose 65+) 02/15/2019  . Influenza Whole 03/07/2007, 01/23/2008, 02/09/2010  . Influenza, High Dose Seasonal PF 01/18/2017, 03/06/2018  . Influenza,inj,Quad PF,6+ Mos 02/13/2013, 04/22/2015  . Pneumococcal  Conjugate-13 12/26/2013  . Pneumococcal Polysaccharide-23 03/07/2007, 03/21/2013  . Td 02/09/2010    Screening Tests Health Maintenance  Topic Date Due  . COVID-19 Vaccine (1) Never done  . OPHTHALMOLOGY EXAM  12/09/2018  . HEMOGLOBIN A1C  08/16/2019  . INFLUENZA VACCINE  11/18/2019  . FOOT EXAM  02/15/2020  . MAMMOGRAM  03/11/2020  . COLONOSCOPY  09/10/2020  . DEXA SCAN  Completed  . Hepatitis C Screening  Completed  . PNA vac Low Risk Adult  Completed       Plan:   ***   I have personally reviewed and noted the following in the patient's chart:   . Medical and social history . Use of alcohol, tobacco or illicit drugs  . Current medications and supplements . Functional ability and status . Nutritional status . Physical activity . Advanced directives . List of other physicians . Hospitalizations, surgeries, and ER visits in previous 12 months . Vitals . Screenings to include cognitive, depression, and falls . Referrals and appointments  In addition, I have reviewed and discussed with patient certain preventive protocols, quality metrics, and best practice recommendations. A written personalized care plan for preventive services as well as general preventive health recommendations were provided to patient.     Naaman Plummer [place], [place]  08/10/2019

## 2019-08-11 ENCOUNTER — Other Ambulatory Visit: Payer: Self-pay | Admitting: Internal Medicine

## 2019-08-13 ENCOUNTER — Other Ambulatory Visit: Payer: Self-pay

## 2019-08-13 ENCOUNTER — Ambulatory Visit: Payer: Medicare Other | Admitting: *Deleted

## 2019-08-13 NOTE — Progress Notes (Signed)
Nurse connected with patient 08/14/19 at  1:00 PM EDT by a telephone enabled telemedicine application and verified that I am speaking with the correct person using two identifiers. Patient stated full name and DOB. Patient gave permission to continue with virtual visit. Patient's location was at home and Nurse's location was at [place] office.   Subjective:   Casey Bryan is a 74 y.o. female who presents for Medicare Annual (Subsequent) preventive examination.  Review of Systems:  Home Safety/Smoke Alarms: Feels safe in home. Smoke alarms in place.  Lives w/ husband in 2 story home. Does well w/ stairs. Stays mainly on 1st floor.   Female:   Mammo-03/13/19       Dexa scan- 12/25/15. ordered     CCS- 09/11/15. Eye- pt states she will schedule soon. Past due because of covid 19      Objective:     Vitals: Unable to assess. This visit is enabled though telemedicine due to Covid 19.   Advanced Directives 08/14/2019 08/11/2018  Does Patient Have a Medical Advance Directive? No No  Would patient like information on creating a medical advance directive? No - Patient declined No - Patient declined    Tobacco Social History   Tobacco Use  Smoking Status Never Smoker  Smokeless Tobacco Never Used     Counseling given: Not Answered   Clinical Intake: Pain : No/denies pain      Past Medical History:  Diagnosis Date  . Amaurosis fugax 12/12/2015  . Diabetes mellitus   . GERD (gastroesophageal reflux disease)   . Glaucoma 04-2012  . Hyperlipemia   . Hypertension    Past Surgical History:  Procedure Laterality Date  . NO PAST SURGERIES     Family History  Problem Relation Age of Onset  . Diabetes Mother        uncles, mother , father   . Coronary artery disease Other        F age 30, B age 76, uncle age 32 : MI  . Heart attack Brother 19  . Breast cancer Sister   . Multiple myeloma Sister   . Colon cancer Neg Hx    Social History   Socioeconomic History  . Marital  status: Married    Spouse name: Not on file  . Number of children: 3  . Years of education: Not on file  . Highest education level: Not on file  Occupational History  . Occupation: semi-retired,own a busines, accountant  Tobacco Use  . Smoking status: Never Smoker  . Smokeless tobacco: Never Used  Substance and Sexual Activity  . Alcohol use: Yes    Comment: socially  . Drug use: No  . Sexual activity: Not on file  Other Topics Concern  . Not on file  Social History Narrative   Original [place]    Social Determinants of Health   Financial Resource Strain:   . Difficulty of Paying Living Expenses:   Food Insecurity:   . Worried About Charity fundraiser in the Last Year:   . Arboriculturist in the Last Year:   Transportation Needs:   . Film/video editor (Medical):   Marland Kitchen Lack of Transportation (Non-Medical):   Physical Activity:   . Days of Exercise per Week:   . Minutes of Exercise per Session:   Stress:   . Feeling of Stress :   Social Connections:   . Frequency of Communication with Friends and Family:   . Frequency of Social Gatherings  with Friends and Family:   . Attends Religious Services:   . Active Member of Clubs or Organizations:   . Attends Archivist Meetings:   Marland Kitchen Marital Status:     Outpatient Encounter Medications as of 08/14/2019  Medication Sig  . aspirin EC 81 MG tablet Take 81 mg by mouth daily.  Marland Kitchen atorvastatin (LIPITOR) 80 MG tablet Take 1 tablet (80 mg total) by mouth daily.  . Blood Glucose Monitoring Suppl (ADVOCATE REDI-CODE) DEVI by Does not apply route. Reported on 04/22/2015  . Calcium Carbonate-Vit D-Min (CALCIUM 1200 PO) Take 1,000 mg by mouth.   . esomeprazole (NEXIUM) 40 MG capsule Take 1 capsule (40 mg total) by mouth daily.  Marland Kitchen glucose blood (ONETOUCH ULTRA) test strip CHECK BLOOD SUGAR NO MORE THAN TWICE DAILY  . irbesartan-hydrochlorothiazide (AVALIDE) 300-12.5 MG tablet Take 1 tablet by mouth daily.  Marland Kitchen lidocaine  (XYLOCAINE) 5 % ointment Apply 1 application topically 2 (two) times daily as needed.   . metFORMIN (GLUCOPHAGE) 1000 MG tablet Take 1 tablet (1,000 mg total) by mouth daily with breakfast.  . metoprolol tartrate (LOPRESSOR) 50 MG tablet Take 2 tablets (100 mg total) by mouth 2 (two) times daily.  . Multiple Vitamin (MULTIVITAMIN) tablet Take 1 tablet by mouth daily.    . Multiple Vitamins-Minerals (EYE VITAMINS) CAPS Take 1 capsule by mouth daily.  Glory Rosebush Delica Lancets 26O MISC USE TO CHECK BLOOD SUGARS NO MORE THAN TWICE DAILY  . [DISCONTINUED] doxycycline (VIBRA-TABS) 100 MG tablet Take 1 tablet (100 mg total) by mouth 2 (two) times daily.   No facility-administered encounter medications on file as of 08/14/2019.    Activities of Daily Living In your present state of health, do you have any difficulty performing the following activities: 08/14/2019  Hearing? N  Vision? N  Difficulty concentrating or making decisions? N  Walking or climbing stairs? N  Dressing or bathing? N  Doing errands, shopping? N  Preparing Food and eating ? N  Using the Toilet? N  In the past six months, have you accidently leaked urine? N  Do you have problems with loss of bowel control? N  Managing your Medications? N  Managing your Finances? N  Housekeeping or managing your Housekeeping? N  Some recent data might be hidden    Patient Care Team: Colon Branch, MD as PCP - Almond Lint, DO as Consulting Physician (Neurology) Calvert Cantor, MD as Consulting Physician (Ophthalmology) Laurence Spates, MD (Inactive) as Consulting Physician (Gastroenterology)    Assessment:   This is a routine wellness examination for Casey Bryan. Physical assessment deferred to PCP.   Exercise Activities and Dietary recommendations Current Exercise Habits: Home exercise routine, Type of exercise: walking, Time (Minutes): 30, Frequency (Times/Week): 2, Weekly Exercise (Minutes/Week): 60, Intensity: Mild, Exercise limited  by: None identified Diet (meal preparation, eat out, water intake, caffeinated beverages, dairy products, fruits and vegetables): in general, a "healthy" diet  , well balanced   Goals    . Increase physical activity       Fall Risk Fall Risk  08/14/2019 08/11/2018 03/08/2018 03/23/2017 03/02/2017  Falls in the past year? 0 0 0 No No  Number falls in past yr: 0 - - - -  Injury with Fall? 0 - - - -  Follow up Education provided;Falls prevention discussed - Falls evaluation completed - -   Depression Screen PHQ 2/9 Scores 08/14/2019 08/11/2018 03/28/2018 03/23/2017  PHQ - 2 Score 0 0 0 0  PHQ- 9  Score - - 2 -     Cognitive Function Ad8 score reviewed for issues:  Issues making decisions:no  Less interest in hobbies / activities:no  Repeats questions, stories (family complaining):no  Trouble using ordinary gadgets (microwave, computer, phone):no  Forgets the month or year: no  Mismanaging finances: no  Remembering appts:no  Daily problems with thinking and/or memory:no Ad8 score is=0         Immunization History  Administered Date(s) Administered  . Fluad Quad(high Dose 65+) 02/15/2019  . Influenza Whole 03/07/2007, 01/23/2008, 02/09/2010  . Influenza, High Dose Seasonal PF 01/18/2017, 03/06/2018  . Influenza,inj,Quad PF,6+ Mos 02/13/2013, 04/22/2015  . Pneumococcal Conjugate-13 12/26/2013  . Pneumococcal Polysaccharide-23 03/07/2007, 03/21/2013  . Td 02/09/2010   Screening Tests Health Maintenance  Topic Date Due  . COVID-19 Vaccine (1) Never done  . OPHTHALMOLOGY EXAM  12/09/2018  . HEMOGLOBIN A1C  08/16/2019  . INFLUENZA VACCINE  11/18/2019  . FOOT EXAM  02/15/2020  . MAMMOGRAM  03/11/2020  . COLONOSCOPY  09/10/2020  . DEXA SCAN  Completed  . Hepatitis C Screening  Completed  . PNA vac Low Risk Adult  Completed  Hepatitis C Screening:      Plan:    Please schedule your next medicare wellness visit with me in 1 yr.  Continue to eat heart healthy diet  (full of fruits, vegetables, whole grains, lean protein, water--limit salt, fat, and sugar intake) and increase physical activity as tolerated.  Continue doing brain stimulating activities (puzzles, reading, adult coloring books, staying active) to keep memory sharp.   I have ordered your bone density scan. Please schedule.  I have personally reviewed and noted the following in the patient's chart:   . Medical and social history . Use of alcohol, tobacco or illicit drugs  . Current medications and supplements . Functional ability and status . Nutritional status . Physical activity . Advanced directives . List of other physicians . Hospitalizations, surgeries, and ER visits in previous 12 months . Vitals . Screenings to include cognitive, depression, and falls . Referrals and appointments  In addition, I have reviewed and discussed with patient certain preventive protocols, quality metrics, and best practice recommendations. A written personalized care plan for preventive services as well as general preventive health recommendations were provided to patient.     Naaman Plummer [place], [place]  08/14/2019

## 2019-08-14 ENCOUNTER — Other Ambulatory Visit: Payer: Self-pay

## 2019-08-14 ENCOUNTER — Encounter: Payer: Self-pay | Admitting: *Deleted

## 2019-08-14 ENCOUNTER — Ambulatory Visit (INDEPENDENT_AMBULATORY_CARE_PROVIDER_SITE_OTHER): Payer: Medicare Other | Admitting: *Deleted

## 2019-08-14 DIAGNOSIS — Z0000 Encounter for general adult medical examination without abnormal findings: Secondary | ICD-10-CM

## 2019-08-14 DIAGNOSIS — Z780 Asymptomatic menopausal state: Secondary | ICD-10-CM

## 2019-08-14 NOTE — Patient Instructions (Signed)
Please schedule your next medicare wellness visit with me in 1 yr.  Continue to eat heart healthy diet (full of fruits, vegetables, whole grains, lean protein, water--limit salt, fat, and sugar intake) and increase physical activity as tolerated.  Continue doing brain stimulating activities (puzzles, reading, adult coloring books, staying active) to keep memory sharp.   I have ordered your bone density scan. Please schedule.   Casey Bryan , Thank you for taking time to come for your Medicare Wellness Visit. I appreciate your ongoing commitment to your health goals. Please review the following plan we discussed and let me know if I can assist you in the future.   These are the goals we discussed: Goals    . Increase physical activity       This is a list of the screening recommended for you and due dates:  Health Maintenance  Topic Date Due  . COVID-19 Vaccine (1) Never done  . Eye exam for diabetics  12/09/2018  . Hemoglobin A1C  08/16/2019  . Flu Shot  11/18/2019  . Complete foot exam   02/15/2020  . Mammogram  03/11/2020  . Colon Cancer Screening  09/10/2020  . DEXA scan (bone density measurement)  Completed  .  Hepatitis C: One time screening is recommended by Center for Disease Control  (CDC) for  adults born from [birth year] through 1964.   Completed  . Pneumonia vaccines  Completed    Preventive Care 37 Years and Older, Female Preventive care refers to lifestyle choices and visits with your health care provider that can promote health and wellness. This includes:  A yearly physical exam. This is also called an annual well check.  Regular dental and eye exams.  Immunizations.  Screening for certain conditions.  Healthy lifestyle choices, such as diet and exercise. What can I expect for my preventive care visit? Physical exam Your health care provider will check:  Height and weight. These may be used to calculate body mass index (BMI), which is a measurement that tells if  you are at a healthy weight.  Heart rate and blood pressure.  Your skin for abnormal spots. Counseling Your health care provider may ask you questions about:  Alcohol, tobacco, and drug use.  Emotional well-being.  Home and relationship well-being.  Sexual activity.  Eating habits.  History of falls.  Memory and ability to understand (cognition).  Work and work Statistician.  Pregnancy and menstrual history. What immunizations do I need?  Influenza (flu) vaccine  This is recommended every year. Tetanus, diphtheria, and pertussis (Tdap) vaccine  You may need a Td booster every 10 years. Varicella (chickenpox) vaccine  You may need this vaccine if you have not already been vaccinated. Zoster (shingles) vaccine  You may need this after age 80. Pneumococcal conjugate (PCV13) vaccine  One dose is recommended after age 28. Pneumococcal polysaccharide (PPSV23) vaccine  One dose is recommended after age 24. Measles, mumps, and rubella (MMR) vaccine  You may need at least one dose of MMR if you were born in 1957 or later. You may also need a second dose. Meningococcal conjugate (MenACWY) vaccine  You may need this if you have certain conditions. Hepatitis A vaccine  You may need this if you have certain conditions or if you travel or work in places where you may be exposed to hepatitis A. Hepatitis B vaccine  You may need this if you have certain conditions or if you travel or work in places where you may be exposed  to hepatitis B. Haemophilus influenzae type b (Hib) vaccine  You may need this if you have certain conditions. You may receive vaccines as individual doses or as more than one vaccine together in one shot (combination vaccines). Talk with your health care provider about the risks and benefits of combination vaccines. What tests do I need? Blood tests  Lipid and cholesterol levels. These may be checked every 5 years, or more frequently depending on  your overall health.  Hepatitis C test.  Hepatitis B test. Screening  Lung cancer screening. You may have this screening every year starting at age 61 if you have a 30-pack-year history of smoking and currently smoke or have quit within the past 15 years.  Colorectal cancer screening. All adults should have this screening starting at age 77 and continuing until age 3. Your health care provider may recommend screening at age 75 if you are at increased risk. You will have tests every 1-10 years, depending on your results and the type of screening test.  Diabetes screening. This is done by checking your blood sugar (glucose) after you have not eaten for a while (fasting). You may have this done every 1-3 years.  Mammogram. This may be done every 1-2 years. Talk with your health care provider about how often you should have regular mammograms.  BRCA-related cancer screening. This may be done if you have a family history of breast, ovarian, tubal, or peritoneal cancers. Other tests  Sexually transmitted disease (STD) testing.  Bone density scan. This is done to screen for osteoporosis. You may have this done starting at age 78. Follow these instructions at home: Eating and drinking  Eat a diet that includes fresh fruits and vegetables, whole grains, lean protein, and low-fat dairy products. Limit your intake of foods with high amounts of sugar, saturated fats, and salt.  Take vitamin and mineral supplements as recommended by your health care provider.  Do not drink alcohol if your health care provider tells you not to drink.  If you drink alcohol: ? Limit how much you have to 0-1 drink a day. ? Be aware of how much alcohol is in your drink. In the [place] one drink equals one 12 oz bottle of beer (355 mL), one 5 oz glass of wine (148 mL), or one 1 oz glass of hard liquor (44 mL). Lifestyle  Take daily care of your teeth and gums.  Stay active. Exercise for at least 30 minutes on 5 or  more days each week.  Do not use any products that contain nicotine or tobacco, such as cigarettes, e-cigarettes, and chewing tobacco. If you need help quitting, ask your health care provider.  If you are sexually active, practice safe sex. Use a condom or other form of protection in order to prevent STIs (sexually transmitted infections).  Talk with your health care provider about taking a low-dose aspirin or statin. What's next?  Go to your health care provider once a year for a well check visit.  Ask your health care provider how often you should have your eyes and teeth checked.  Stay up to date on all vaccines. This information is not intended to replace advice given to you by your health care provider. Make sure you discuss any questions you have with your health care provider. Document Revised: 03/30/2018 Document Reviewed: 03/30/2018 Elsevier Patient Education  2020 [employer].

## 2019-08-15 ENCOUNTER — Other Ambulatory Visit: Payer: Self-pay

## 2019-08-15 ENCOUNTER — Other Ambulatory Visit: Payer: Self-pay | Admitting: Internal Medicine

## 2019-08-15 ENCOUNTER — Ambulatory Visit (INDEPENDENT_AMBULATORY_CARE_PROVIDER_SITE_OTHER): Payer: Medicare Other | Admitting: Internal Medicine

## 2019-08-15 ENCOUNTER — Encounter: Payer: Self-pay | Admitting: Internal Medicine

## 2019-08-15 VITALS — BP 157/67 | HR 57 | Temp 97.2°F | Resp 18 | Ht 64.0 in | Wt 210.4 lb

## 2019-08-15 DIAGNOSIS — Z794 Long term (current) use of insulin: Secondary | ICD-10-CM

## 2019-08-15 DIAGNOSIS — D649 Anemia, unspecified: Secondary | ICD-10-CM

## 2019-08-15 DIAGNOSIS — E119 Type 2 diabetes mellitus without complications: Secondary | ICD-10-CM

## 2019-08-15 DIAGNOSIS — I10 Essential (primary) hypertension: Secondary | ICD-10-CM | POA: Diagnosis not present

## 2019-08-15 MED ORDER — IRBESARTAN 300 MG PO TABS: 300.0000 mg | ORAL_TABLET | Freq: Every day | ORAL | 0 refills | Status: DC

## 2019-08-15 MED ORDER — CHLORTHALIDONE 25 MG PO TABS
25.0000 mg | ORAL_TABLET | Freq: Every day | ORAL | 0 refills | Status: DC
Start: 2019-08-15 — End: 2019-08-30

## 2019-08-15 NOTE — Progress Notes (Signed)
Pre visit review using our clinic review tool, if applicable. No additional management support is needed unless otherwise documented below in the visit note. 

## 2019-08-15 NOTE — Patient Instructions (Addendum)
Per our records you are due for an eye exam. Please contact your eye doctor to schedule an appointment. Please have them send copies of your office visit notes to [place]. Our fax number is [phone].  Stop irbesartan - HCT  Start irbesartan 300 mg daily Start chlorthalidone 25 mg daily  Continue checking your blood pressures BP GOAL is between 110/65 and  135/85. If you are not at goal in the next 2 to 3 weeks let me know   [place], [place] back for   blood work in 2 weeks, no need to be fasting.  Come back for a checkup in 4 months

## 2019-08-15 NOTE — Assessment & Plan Note (Signed)
DM: Last A1c 6.7.  Currently on Metformin, CBGs always less than 150, this morning 122.  Check a A1c. HTN: Not well controlled, ambulatory BPs in the 160s, 170s. Currently on metoprolol and Avalide. Multiple options for treatment, will recommend to stop Avalide and start Avapro 300mg  plus chlorthalidone 25 mg daily.  Monitor BPs.  BMP in 2 weeks. On review of systems, no symptoms consistent with OSA. Iron deficiency anemia: Last hemoglobin 11.5, stable ferritin in the low side of normal, was recommended to continue iron supplements. RTC labs 2 weeks RTC 4 months

## 2019-08-15 NOTE — Progress Notes (Signed)
Subjective:    Patient ID: Casey Bryan, female    DOB: 10/25/1945, 74 y.o.   MRN: [id number]  DOS:  08/15/2019 Type of visit - description:  rov Here with her husband and daughter. We talk about hypertension, diabetes and the last results of her carotid ultrasound.    Review of Systems Denies chest pain no difficulty breathing. Lower extremity edema at baseline No nausea, vomiting, diarrhea.  No blood in the stools.  No snoring or feeling sleepy throughout the day.  Past Medical History:  Diagnosis Date  . Amaurosis fugax 12/12/2015  . Diabetes mellitus   . GERD (gastroesophageal reflux disease)   . Glaucoma 04-2012  . Hyperlipemia   . Hypertension     Past Surgical History:  Procedure Laterality Date  . NO PAST SURGERIES      Allergies as of 08/15/2019      Reactions   Prevnar [pneumococcal 13-val Conj Vacc] Nausea And Vomiting, Other (See Comments)   Fever of 102.    Clarithromycin [clarithromycin]    Contradictions w/Vytorin   Penicillins    Tetanus-diphtheria Toxoids Td    REACTION: see OV note  02-11-10      Medication List       Accurate as of August 15, 2019  9:30 PM. If you have any questions, ask your nurse or doctor.        STOP taking these medications   irbesartan-hydrochlorothiazide 300-12.5 MG tablet Commonly known as: AVALIDE Stopped by: Kathlene November, MD     TAKE these medications   Advocate Redi-Code Devi by Does not apply route. Reported on 04/22/2015   aspirin EC 81 MG tablet Take 81 mg by mouth daily.   atorvastatin 80 MG tablet Commonly known as: LIPITOR Take 1 tablet (80 mg total) by mouth daily.   CALCIUM 1200 PO Take 1,000 mg by mouth.   chlorthalidone 25 MG tablet Commonly known as: HYGROTON Take 1 tablet (25 mg total) by mouth daily. Started by: Kathlene November, MD   esomeprazole 40 MG capsule Commonly known as: NexIUM Take 1 capsule (40 mg total) by mouth daily.   Eye Vitamins Caps Take 1 capsule by mouth daily.   irbesartan  300 MG tablet Commonly known as: Avapro Take 1 tablet (300 mg total) by mouth daily. Started by: Kathlene November, MD   lidocaine 5 % ointment Commonly known as: XYLOCAINE Apply 1 application topically 2 (two) times daily as needed.   metFORMIN 1000 MG tablet Commonly known as: GLUCOPHAGE Take 1 tablet (1,000 mg total) by mouth daily with breakfast.   metoprolol tartrate 50 MG tablet Commonly known as: LOPRESSOR Take 2 tablets (100 mg total) by mouth 2 (two) times daily.   multivitamin tablet Take 1 tablet by mouth daily.   OneTouch Delica Lancets 99991111 Misc USE TO CHECK BLOOD SUGARS NO MORE THAN TWICE DAILY   OneTouch Ultra test strip Generic drug: glucose blood CHECK BLOOD SUGAR NO MORE THAN TWICE DAILY          Objective:   Physical Exam BP (!) 157/67 (BP Location: Left Arm, Patient Position: Sitting, Cuff Size: Normal)   Pulse (!) 57   Temp (!) 97.2 F (36.2 C) (Temporal)   Resp 18   Ht 5\' 4"  (1.626 m)   Wt 210 lb 6 oz (95.4 kg)   SpO2 98%   BMI 36.11 kg/m  General:   Well developed, NAD, BMI noted. HEENT:  Normocephalic . Face symmetric, atraumatic Lungs:  CTA B Normal respiratory  effort, no intercostal retractions, no accessory muscle use. Heart: RRR,  no murmur.  Lower extremities: no pretibial pitting  edema bilaterally  Skin: Not pale. Not jaundice Neurologic:  alert & oriented X3.  Speech normal, gait appropriate for age and unassisted Psych--  Cognition and judgment appear intact.  Cooperative with normal attention span and concentration.  Behavior appropriate. No anxious or depressed appearing.      Assessment      Assessment: DM HTN (intolerant amlodipine 11-2015 and 2018) Hyperlipidemia Glaucoma dx 2014, cataracts NEURO: L amaurosix fugax 03-2015, CVA -- saw opthalmology, started ASA  (02-2015); echo 04-09-15: nl EF, grade 2 Diast disfx -- saw neuro: Probable cryptogenic  --Headaches: Sees  neurology   CV: Carotid artery disease:    [place]  04-2015: Progression of the right ICA now 40-59%. Stable left ICA 1-39%. [place] 02/2018 : carotids essentially normal GI: -GERD -Gastri polyp removal 08-2015, Dr Oletta Lamas + Covid 04-2019  PLAN: DM: Last A1c 6.7.  Currently on Metformin, CBGs always less than 150, this morning 122.  Check a A1c. HTN: Not well controlled, ambulatory BPs in the 160s, 170s. Currently on metoprolol and Avalide. Multiple options for treatment, will recommend to stop Avalide and start Avapro 300mg  plus chlorthalidone 25 mg daily.  Monitor BPs.  BMP in 2 weeks. On review of systems, no symptoms consistent with OSA. Iron deficiency anemia: Last hemoglobin 11.5, stable ferritin in the low side of normal, was recommended to continue iron supplements. RTC labs 2 weeks RTC 4 months   This visit occurred during the SARS-CoV-2 public health emergency.  Safety protocols were in place, including screening questions prior to the visit, additional usage of staff PPE, and extensive cleaning of exam room while observing appropriate contact time as indicated for disinfecting solutions.

## 2019-08-29 ENCOUNTER — Other Ambulatory Visit: Payer: Self-pay

## 2019-08-29 ENCOUNTER — Other Ambulatory Visit (INDEPENDENT_AMBULATORY_CARE_PROVIDER_SITE_OTHER): Payer: Medicare Other

## 2019-08-29 DIAGNOSIS — I10 Essential (primary) hypertension: Secondary | ICD-10-CM

## 2019-08-29 DIAGNOSIS — E119 Type 2 diabetes mellitus without complications: Secondary | ICD-10-CM

## 2019-08-29 DIAGNOSIS — Z794 Long term (current) use of insulin: Secondary | ICD-10-CM

## 2019-08-29 LAB — BASIC METABOLIC PANEL
BUN: 27 mg/dL — ABNORMAL HIGH (ref 6–23)
CO2: 33 mEq/L — ABNORMAL HIGH (ref 19–32)
Calcium: 9.4 mg/dL (ref 8.4–10.5)
Chloride: 99 mEq/L (ref 96–112)
Creatinine, Ser: 0.75 mg/dL (ref 0.40–1.20)
GFR: 75.76 mL/min (ref >60.00)
Glucose, Bld: 102 mg/dL — ABNORMAL HIGH (ref 70–99)
Potassium: 3.5 mEq/L (ref 3.5–5.1)
Sodium: 137 mEq/L (ref 135–145)

## 2019-08-29 LAB — HEMOGLOBIN A1C: Hgb A1c MFr Bld: 6.7 % — ABNORMAL HIGH (ref 4.6–6.5)

## 2019-08-30 MED ORDER — IRBESARTAN 300 MG PO TABS: 300.0000 mg | ORAL_TABLET | Freq: Every day | ORAL | 1 refills | Status: DC

## 2019-08-30 MED ORDER — CHLORTHALIDONE 25 MG PO TABS: 25.0000 mg | ORAL_TABLET | Freq: Every day | ORAL | 1 refills | Status: DC

## 2019-08-30 NOTE — Addendum Note (Signed)
Addended byDamita Dunnings D on: 08/30/2019 12:44 PM   Modules accepted: Orders

## 2019-10-31 ENCOUNTER — Encounter: Payer: Self-pay | Admitting: Internal Medicine

## 2019-11-01 ENCOUNTER — Other Ambulatory Visit: Payer: Self-pay | Admitting: Internal Medicine

## 2019-11-18 ENCOUNTER — Other Ambulatory Visit: Payer: Self-pay | Admitting: Internal Medicine

## 2019-11-23 ENCOUNTER — Encounter: Payer: Self-pay | Admitting: Family Medicine

## 2019-11-23 ENCOUNTER — Other Ambulatory Visit: Payer: Self-pay

## 2019-11-23 ENCOUNTER — Telehealth (INDEPENDENT_AMBULATORY_CARE_PROVIDER_SITE_OTHER): Payer: Medicare Other | Admitting: Family Medicine

## 2019-11-23 DIAGNOSIS — R110 Nausea: Secondary | ICD-10-CM | POA: Diagnosis not present

## 2019-11-23 DIAGNOSIS — R197 Diarrhea, unspecified: Secondary | ICD-10-CM

## 2019-11-23 MED ORDER — DICYCLOMINE HCL 10 MG PO CAPS: 10.0000 mg | ORAL_CAPSULE | Freq: Three times a day (TID) | ORAL | 0 refills | Status: DC

## 2019-11-23 MED ORDER — ONDANSETRON 4 MG PO TBDP: 4.0000 mg | ORAL_TABLET | Freq: Three times a day (TID) | ORAL | 0 refills | Status: DC | PRN

## 2019-11-23 NOTE — Progress Notes (Signed)
Virtual Visit via Video Note  I connected with Casey Bryan on 11/23/19 at  1:40 PM EDT by a video enabled telemedicine application and verified that I am speaking with the correct person using two identifiers.  Location: Patient: home with daughter  Provider: office    I discussed the limitations of evaluation and management by telemedicine and the availability of in person appointments. The patient expressed understanding and agreed to proceed.  History of Present Illness: Pt is home with her daughter c/o fever 99 -100.4  The first day.   She also loose stools,  Every time she eats she has loose stools  X 3 days ---  She had sushi the day before -- [place] roll  + nausea , no vomiting,  _ + dyspepsia--- pepcid helps     She ate bread / yogurt  She has had pepto bismol with no relief   some headache --- no more fevers  She has taken tylenol  + abd cramping  Observations/Objective: 96.8  bp 136/68  02 95  p 60 glucose 103   Rapid covid neg --  Home test -- she has had vaccine Pt in [place] with daughter --nad Assessment and Plan: 1. Diarrhea, unspecified type IBS vs viral meds per orders  - dicyclomine (BENTYL) 10 MG capsule; Take 1 capsule (10 mg total) by mouth 4 (four) times daily -  before meals and at bedtime.  Dispense: 30 capsule; Refill: 0 pedialyte ice pops and gatorade for no more than 3 days  To er if no improvement with meds for ivf F/u Monday if needed  2. Nausea If she starts vomiting with zofran -- she may need to go to er--- ok to con't pepcid - ondansetron (ZOFRAN ODT) 4 MG disintegrating tablet; Take 1 tablet (4 mg total) by mouth every 8 (eight) hours as needed for nausea or vomiting.  Dispense: 20 tablet; Refill: 0  Follow Up Instructions:    I discussed the assessment and treatment plan with the patient. The patient was provided an opportunity to ask questions and all were answered. The patient agreed with the plan and demonstrated an understanding of the  instructions.   The patient was advised to call back or seek an in-person evaluation if the symptoms worsen or if the condition fails to improve as anticipated.  I provided 25 minutes of non-face-to-face time during this encounter.   Ann Held, DO

## 2019-11-27 ENCOUNTER — Telehealth: Payer: Self-pay

## 2019-11-27 NOTE — Telephone Encounter (Signed)
Spoke with patients daughter regarding BP.  BP today is 136/65, has taken antihypertensive sporadically since 11/23/19.  Patient continues to be fatigued, but was able to eat without diarrhea today. Denies nausea.  Advised to monitor BP daily, share readings with PCP via MyChart on Friday.  Patient has f/u appt with PCP on 12/19/2019.    [facility] Night - Client TELEPHONE ADVICE RECORD Access[facility]urse Patient Name: KALAH PFLUM Gender: Female DOB: 04-Aug-1945 Age: 74 Y 2 M 6 D Return Phone Number: [phone] (Primary) Address: City/State/Zip: [place] [address] Client [facility] Night - Client Client Site [facility] - Night Physician Roma Schanz- MD Contact Type Call Who Is Calling Patient / Member / Family / Caregiver Call Type Triage / Clinical Caller Name Andee Lineman Relationship To Patient Daughter Return Phone Number [phone] (Primary) Chief Complaint Blood Pressure Low Reason for Call Symptomatic / Request for [place] states her mother had a virtual visit yesterday. She was started on a new medication and now her blood pressure is running low. Blood pressure is 125/52. Translation No Nurse Assessment Nurse: Mendel Ryder, RN, Denman George Date/Time (Eastern Time): 11/24/2019 9:20:07 AM Confirm and document reason for call. If symptomatic, describe symptoms. ---Caller states her mother had a virtual visit yesterday. She was started on a new medication and now her blood pressure is running low. Blood pressure is 125/52. Has the patient had close contact with a person known or suspected to have the novel coronavirus illness OR traveled / lives in area with major community spread (including international travel) in the last 14 days from the onset of symptoms? * If Asymptomatic, screen for exposure and travel within the last 14 days. ---No Does the patient have any new or  worsening symptoms? ---Yes Will a triage be completed? ---Yes Related visit to physician within the last 2 weeks? ---Yes Does the PT have any chronic conditions? (i.e. diabetes, asthma, this includes High risk factors for pregnancy, etc.) ---Yes List chronic conditions. ---hypertension diabetes cholesterol GERD Is this a behavioral health or substance abuse call? ---No Guidelines Guideline Title Affirmed Question Affirmed Notes Nurse Date/Time (Eastern Time) Blood Pressure - Low [1] Drinking very little AND [2] dehydration suspected (e.g., no urine > Mendel Ryder, RN, Denman George 11/24/2019 9:27:37 AM PLEASE NOTE: All timestamps contained within this report are represented as [place] Standard Time. CONFIDENTIALTY NOTICE: This fax transmission is intended only for the addressee. It contains information that is legally privileged, confidential or otherwise protected from use or disclosure. If you are not the intended recipient, you are strictly prohibited from reviewing, disclosing, copying using or disseminating any of this information or taking any action in reliance on or regarding this information. If you have received this fax in error, please notify us immediately by telephone so that we can arrange for its return to [place]. Phone: [phone], Toll-Free: [phone], Fax: [phone] Page: 2 of 2 Call Id: [id number] Guidelines Guideline Title Affirmed Question Affirmed Notes Nurse Date/Time Eilene Ghazi Time) 12 hours, very dry mouth, very lightheaded) Disp. Time Eilene Ghazi Time) Disposition Final User 11/24/2019 10:04:53 AM Go to ED Now Yes Mendel Ryder, RN, Denman George Caller Disagree/Comply Comply Caller Understands Yes PreDisposition Call Doctor Care Advice Given Per Guideline GO TO ED NOW: NOTE TO TRIAGER - DRIVING: CARE ADVICE given per Low Blood Pressure (Adult) guideline. Comments User: Caprice Renshaw, RN Date/Time Eilene Ghazi Time): 11/24/2019 10:10:31 AM Caller wanted to speak to On Call however no  On call available at  this time. Recommended to go ER, Caller verbalized an understanding. Reports no further concerns. Referrals GO TO FACILITY UNDECIDED

## 2019-11-27 NOTE — Telephone Encounter (Signed)
Agree, thank you

## 2019-11-29 ENCOUNTER — Encounter: Payer: Self-pay | Admitting: Internal Medicine

## 2019-11-29 ENCOUNTER — Telehealth: Payer: Self-pay | Admitting: Internal Medicine

## 2019-11-29 IMAGING — MG DIGITAL SCREENING BILATERAL MAMMOGRAM WITH TOMO AND CAD
8 series · 8 of 24 positions shown · non-contrast
Comparison: Previous exam(s).

CLINICAL DATA: Screening.

EXAM:
DIGITAL SCREENING BILATERAL MAMMOGRAM WITH TOMO AND CAD

[R MLO synth-2D]
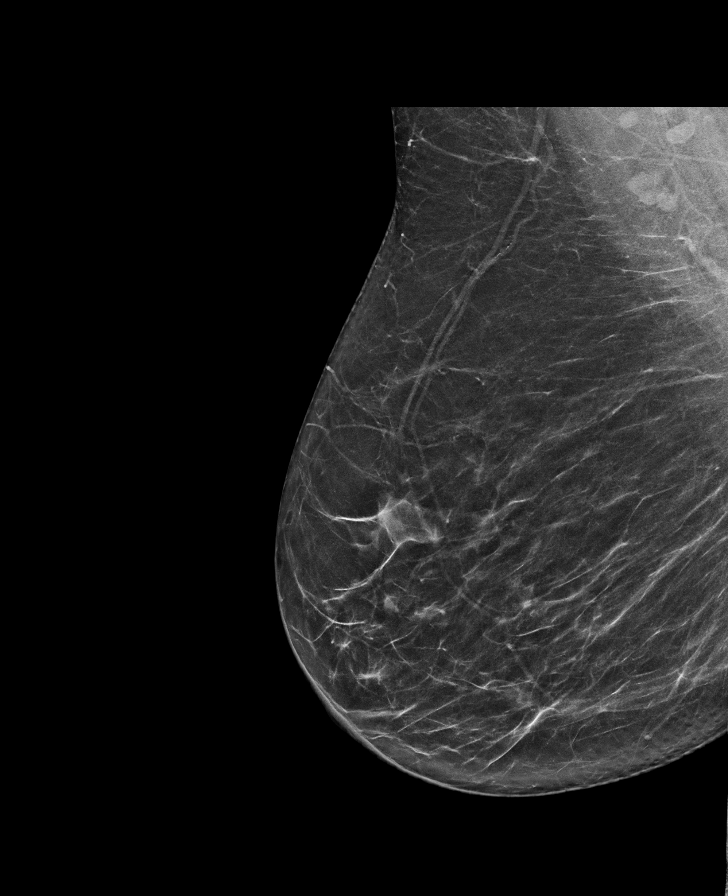

[L CC synth-2D]
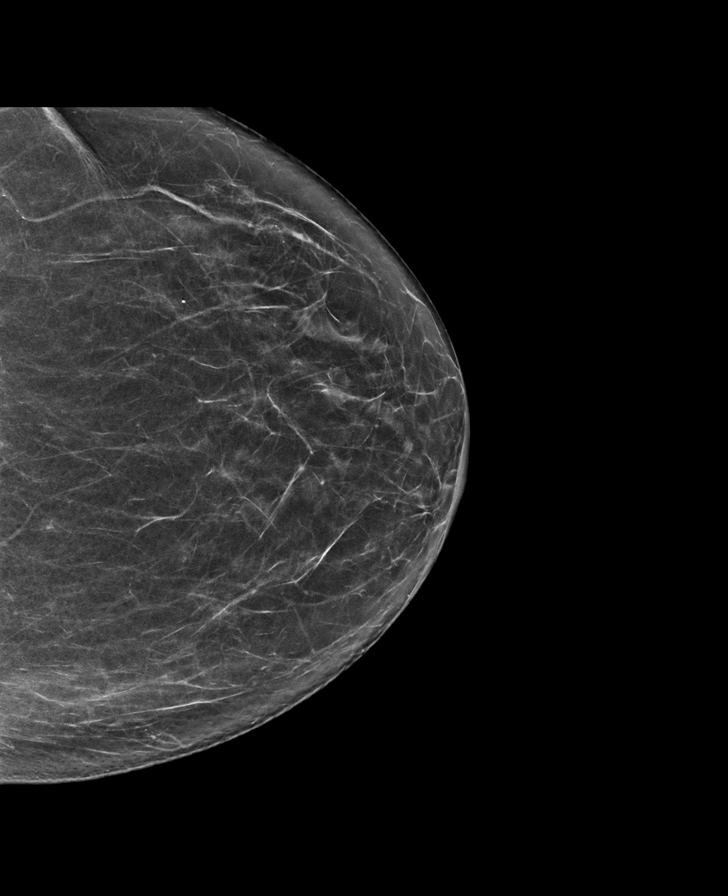

[L MLO synth-2D]
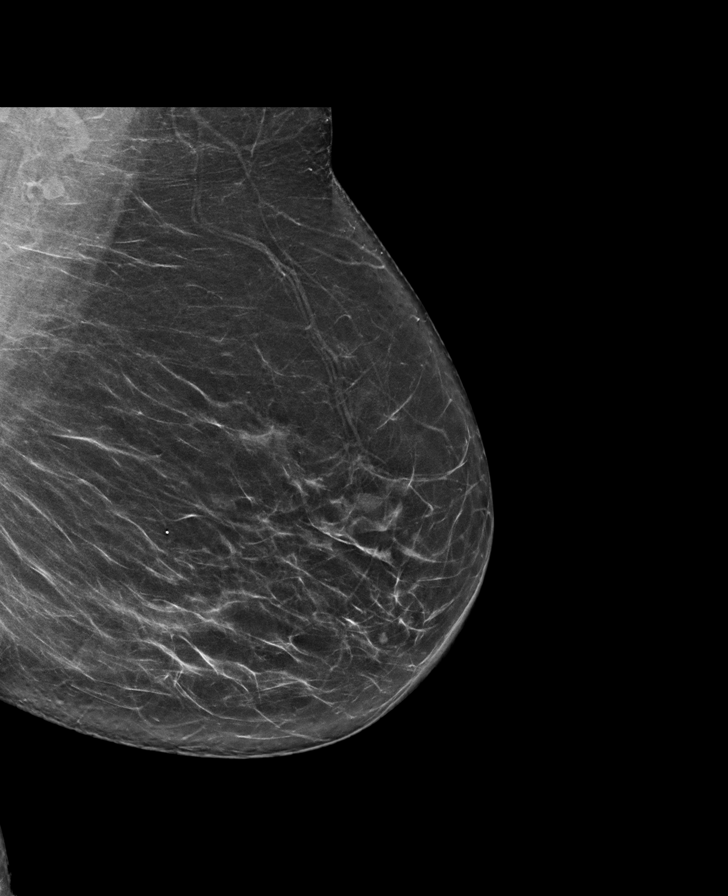

[R CC synth-2D]
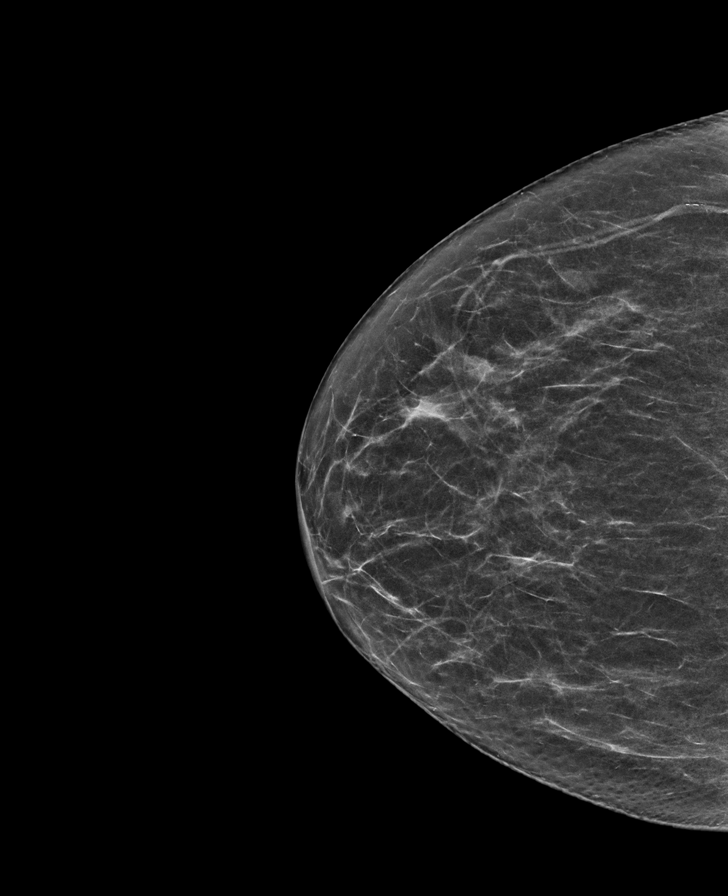

[R CC tomo · tomo slice 32/63.0]
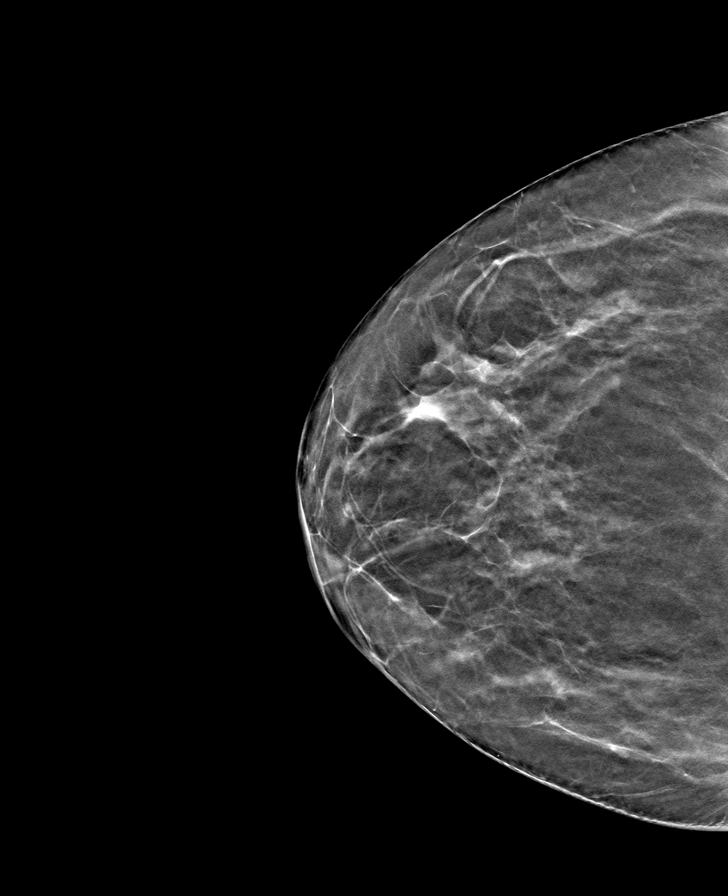

[L MLO tomo · tomo slice 38/75.0]
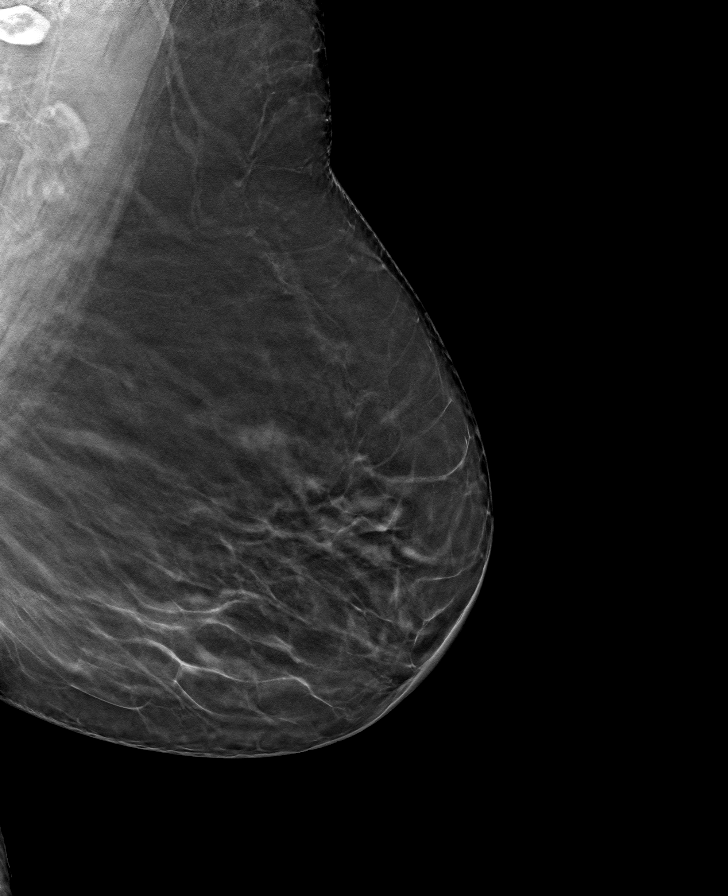

[L CC tomo · tomo slice 35/69.0]
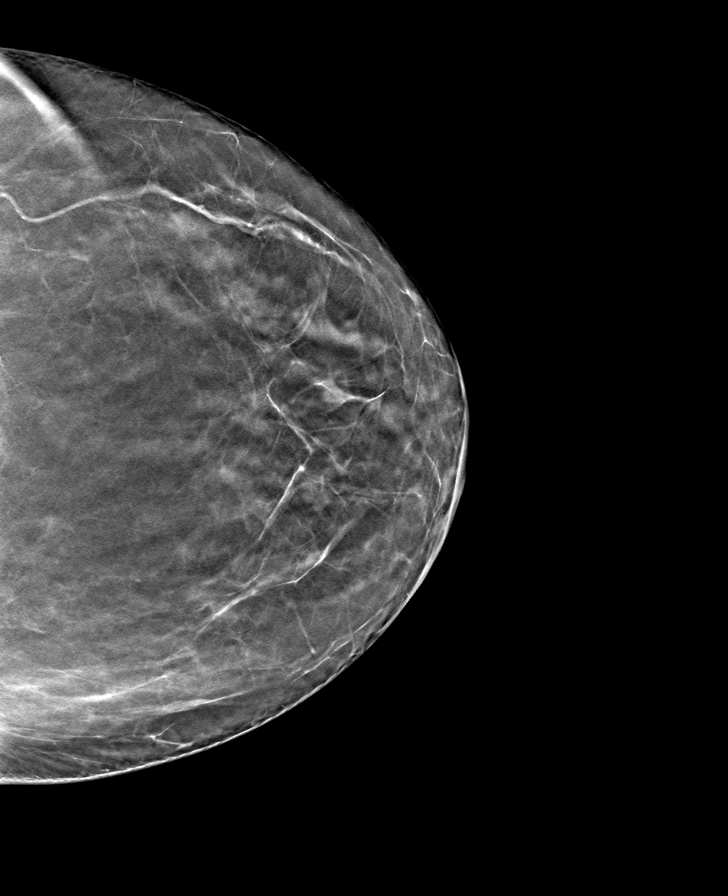

[R MLO tomo · tomo slice 37/72.0]
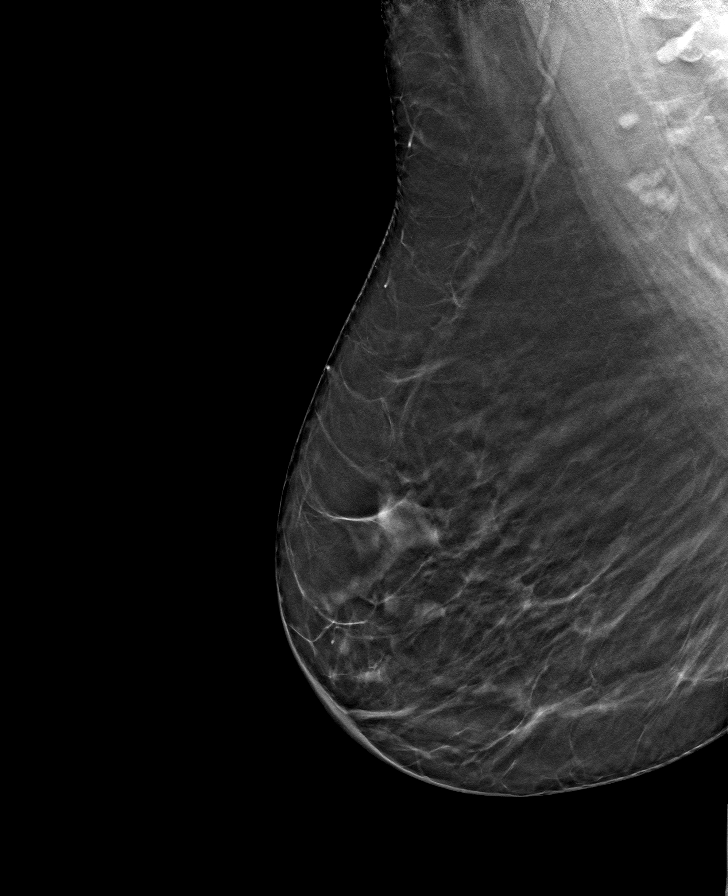

[8 of 24 positions shown; findings below may reference images not displayed]

ACR Breast Density Category b: There are scattered areas of
fibroglandular density.
FINDINGS: There are no findings suspicious for malignancy. Images were
processed with CAD.
IMPRESSION: No mammographic evidence of malignancy. A result letter of this
screening mammogram will be mailed directly to the patient.

RECOMMENDATION:
Screening mammogram in one year. (Code:[id number])

BI-RADS CATEGORY  1: Negative.

## 2019-11-29 NOTE — Telephone Encounter (Signed)
Patient has an appointment schedule for Monday 12/03/19. Patient would like to get labs done Friday.

## 2019-11-30 NOTE — Telephone Encounter (Signed)
Called pt and was unable to tell patient our Lab office doesn't have any appointments for today

## 2019-12-03 ENCOUNTER — Other Ambulatory Visit: Payer: Self-pay

## 2019-12-03 ENCOUNTER — Encounter: Payer: Self-pay | Admitting: Internal Medicine

## 2019-12-03 ENCOUNTER — Ambulatory Visit (INDEPENDENT_AMBULATORY_CARE_PROVIDER_SITE_OTHER): Payer: Medicare Other | Admitting: Internal Medicine

## 2019-12-03 VITALS — BP 148/76 | HR 55 | Temp 97.8°F | Resp 16 | Ht 64.0 in | Wt 212.5 lb

## 2019-12-03 DIAGNOSIS — I10 Essential (primary) hypertension: Secondary | ICD-10-CM | POA: Diagnosis not present

## 2019-12-03 DIAGNOSIS — R197 Diarrhea, unspecified: Secondary | ICD-10-CM

## 2019-12-03 DIAGNOSIS — E119 Type 2 diabetes mellitus without complications: Secondary | ICD-10-CM

## 2019-12-03 DIAGNOSIS — Z794 Long term (current) use of insulin: Secondary | ICD-10-CM

## 2019-12-03 DIAGNOSIS — E785 Hyperlipidemia, unspecified: Secondary | ICD-10-CM | POA: Diagnosis not present

## 2019-12-03 LAB — CBC WITH DIFFERENTIAL/PLATELET
Basophils Absolute: 0 10*3/uL (ref 0.0–0.1)
Basophils Relative: 0.6 % (ref 0.0–3.0)
Eosinophils Absolute: 0.1 10*3/uL (ref 0.0–0.7)
Eosinophils Relative: 2.3 % (ref 0.0–5.0)
HCT: 33.5 % — ABNORMAL LOW (ref 36.0–46.0)
Hemoglobin: 11.3 g/dL — ABNORMAL LOW (ref 12.0–15.0)
Lymphocytes Relative: 29.1 % (ref 12.0–46.0)
Lymphs Abs: 1.6 10*3/uL (ref 0.7–4.0)
MCHC: 33.8 g/dL (ref 30.0–36.0)
MCV: 84.4 fl (ref 78.0–100.0)
Monocytes Absolute: 0.4 10*3/uL (ref 0.1–1.0)
Monocytes Relative: 7.2 % (ref 3.0–12.0)
Neutro Abs: 3.4 10*3/uL (ref 1.4–7.7)
Neutrophils Relative %: 60.8 % (ref 43.0–77.0)
Platelets: 255 10*3/uL (ref 150.0–400.0)
RBC: 3.97 Mil/uL (ref 3.87–5.11)
RDW: 14.7 % (ref 11.5–15.5)
WBC: 5.5 10*3/uL (ref 4.0–10.5)

## 2019-12-03 LAB — LIPID PANEL
Cholesterol: 128 mg/dL (ref 0–200)
HDL: 45.7 mg/dL (ref >39.00)
LDL Cholesterol: 61 mg/dL (ref 0–99)
NonHDL: 82.42
Total CHOL/HDL Ratio: 3
Triglycerides: 109 mg/dL (ref 0.0–149.0)
VLDL: 21.8 mg/dL (ref 0.0–40.0)

## 2019-12-03 LAB — COMPREHENSIVE METABOLIC PANEL
ALT: 12 U/L (ref 0–35)
AST: 15 U/L (ref 0–37)
Albumin: 3.9 g/dL (ref 3.5–5.2)
Alkaline Phosphatase: 54 U/L (ref 39–117)
BUN: 17 mg/dL (ref 6–23)
CO2: 29 mEq/L (ref 19–32)
Calcium: 9 mg/dL (ref 8.4–10.5)
Chloride: 97 mEq/L (ref 96–112)
Creatinine, Ser: 1.02 mg/dL (ref 0.40–1.20)
GFR: 53.09 mL/min — ABNORMAL LOW (ref >60.00)
Glucose, Bld: 116 mg/dL — ABNORMAL HIGH (ref 70–99)
Potassium: 3.5 mEq/L (ref 3.5–5.1)
Sodium: 137 mEq/L (ref 135–145)
Total Bilirubin: 0.6 mg/dL (ref 0.2–1.2)
Total Protein: 6.5 g/dL (ref 6.0–8.3)

## 2019-12-03 LAB — HEMOGLOBIN A1C: Hgb A1c MFr Bld: 6.9 % — ABNORMAL HIGH (ref 4.6–6.5)

## 2019-12-03 NOTE — Progress Notes (Signed)
Subjective:    Patient ID: Casey Bryan, female    DOB: 1945-09-28, 74 y.o.   MRN: [id number]  DOS:  12/03/2019 Type of visit - description: Follow-up.  Was seen virtually 11/23/2019: Had a temperature up to 100.4, loose stools.  + Nausea no vomiting. Rx Bentyl. Currently doing better but continued to have frequent bowel movements, essentially every time she eats.  At this time the stools are formed.  No watery, no blood in the stools. Nausea vomiting resolved. She still has some lower abdominal discomfort.  We review here ambulatory blood pressures and CBGs   Review of Systems Denies fever chills No dysuria gross hematuria difficulty urinating  Past Medical History:  Diagnosis Date  . Amaurosis fugax 12/12/2015  . Diabetes mellitus   . GERD (gastroesophageal reflux disease)   . Glaucoma 04-2012  . Hyperlipemia   . Hypertension     Past Surgical History:  Procedure Laterality Date  . NO PAST SURGERIES      Allergies as of 12/03/2019      Reactions   Prevnar [pneumococcal 13-val Conj Vacc] Nausea And Vomiting, Other (See Comments)   Fever of 102.    Clarithromycin [clarithromycin]    Contradictions w/Vytorin   Penicillins    Tetanus-diphtheria Toxoids Td    REACTION: see OV note  02-11-10      Medication List       Accurate as of December 03, 2019 11:59 PM. If you have any questions, ask your nurse or doctor.        [facility] Redi-Code [place] by Does not apply route. Reported on 04/22/2015   aspirin EC 81 MG tablet Take 81 mg by mouth daily.   atorvastatin 80 MG tablet Commonly known as: LIPITOR Take 1 tablet (80 mg total) by mouth daily.   CALCIUM 1200 PO Take 1,000 mg by mouth.   chlorthalidone 25 MG tablet Commonly known as: HYGROTON TAKE 1 TABLET(25 MG) BY MOUTH DAILY   dicyclomine 10 MG capsule Commonly known as: Bentyl Take 1 capsule (10 mg total) by mouth 4 (four) times daily -  before meals and at bedtime.   esomeprazole 40 MG capsule Commonly  known as: NexIUM Take 1 capsule (40 mg total) by mouth daily.   Eye Vitamins Caps Take 1 capsule by mouth daily.   irbesartan 300 MG tablet Commonly known as: AVAPRO TAKE 1 TABLET(300 MG) BY MOUTH DAILY   lidocaine 5 % ointment Commonly known as: XYLOCAINE Apply 1 application topically 2 (two) times daily as needed.   metFORMIN 1000 MG tablet Commonly known as: GLUCOPHAGE Take 1 tablet (1,000 mg total) by mouth daily with breakfast.   metoprolol tartrate 50 MG tablet Commonly known as: LOPRESSOR Take 2 tablets (100 mg total) by mouth 2 (two) times daily.   multivitamin tablet Take 1 tablet by mouth daily.   ondansetron 4 MG disintegrating tablet Commonly known as: Zofran ODT Take 1 tablet (4 mg total) by mouth every 8 (eight) hours as needed for nausea or vomiting.   OneTouch Delica Lancets 25D Misc USE TO CHECK BLOOD SUGARS NO MORE THAN TWICE DAILY   OneTouch Ultra test strip Generic drug: glucose blood CHECK BLOOD SUGAR NO MORE THAN TWICE DAILY          Objective:   Physical Exam BP (!) 148/76 (BP Location: Left Arm, Patient Position: Sitting, Cuff Size: Normal)   Pulse (!) 55   Temp 97.8 F (36.6 C) (Oral)   Resp 16   Ht 5'  4" (1.626 m)   Wt 212 lb 8 oz (96.4 kg)   SpO2 98%   BMI 36.48 kg/m  General:   Well developed, NAD, BMI noted.  HEENT:  Normocephalic . Face symmetric, atraumatic Lungs:  CTA B Normal respiratory effort, no intercostal retractions, no accessory muscle use. Heart: RRR,  no murmur.  Abdomen:  Not distended, soft, bowel sounds are present, minimal discomfort throughout the abdomen mostly on the lower areas.  No mass, no rebound. Skin: Not pale. Not jaundice Lower extremities: no pretibial edema bilaterally  Neurologic:  alert & oriented X3.  Speech normal, gait appropriate for age and unassisted Psych--  Cognition and judgment appear intact.  Cooperative with normal attention span and concentration.  Behavior appropriate. No  anxious or depressed appearing.     Assessment     Assessment: DM HTN (intolerant amlodipine 11-2015 and 2018) Hyperlipidemia Glaucoma dx 2014, cataracts NEURO: L amaurosix fugax 03-2015, CVA -- saw opthalmology, started ASA  (02-2015); echo 04-09-15: nl EF, grade 2 Diast disfx -- saw neuro: Probable cryptogenic  --Headaches: Sees  neurology   CV: Carotid artery disease:    [place] 04-2015: Progression of the right ICA now 40-59%. Stable left ICA 1-39%. [place] 02/2018 : carotids essentially normal GI: -GERD -Anemia: EGD colonoscopy 2017, Dr. Sherran Needs polyp removed + Covid 04-2019  PLAN: DM:On Metformin, ambulatory CBGs in the 130s.  Check A1c. HTN: Since the last office visit ambulatory seem to be very good in the 120s/60s with a heart rate of 60s.  She is on chlorthalidone, Avapro, metoprolol.  Check a CMP and CBC Hyperlipidemia: On Lipitor, check FLP Diarrhea: Acute, started few days ago, getting better.  Still has residual lower abdominal pain but exam of the abdomen is nonacute.  Recommend a bland diet, push fluids, use Bentyl as needed.  Call if not gradually better, if fever chills, severe symptoms, blood in the stools. RTC 4 to 5 months    This visit occurred during the SARS-CoV-2 public health emergency.  Safety protocols were in place, including screening questions prior to the visit, additional usage of staff PPE, and extensive cleaning of exam room while observing appropriate contact time as indicated for disinfecting solutions.

## 2019-12-03 NOTE — Patient Instructions (Signed)
Continue checking your blood pressure and blood sugars  For diarrhea: Drink plenty of fluids, call if not gradually better.   GO TO THE LAB : Get the blood work     GO TO THE FRONT DESK, [place] back for a checkup in 4 to 5 months

## 2019-12-03 NOTE — Progress Notes (Signed)
Pre visit review using our clinic review tool, if applicable. No additional management support is needed unless otherwise documented below in the visit note. 

## 2019-12-04 NOTE — Assessment & Plan Note (Signed)
DM:On Metformin, ambulatory CBGs in the 130s.  Check A1c. HTN: Since the last office visit ambulatory seem to be very good in the 120s/60s with a heart rate of 60s.  She is on chlorthalidone, Avapro, metoprolol.  Check a CMP and CBC Hyperlipidemia: On Lipitor, check FLP Diarrhea: Acute, started few days ago, getting better.  Still has residual lower abdominal pain but exam of the abdomen is nonacute.  Recommend a bland diet, push fluids, use Bentyl as needed.  Call if not gradually better, if fever chills, severe symptoms, blood in the stools. RTC 4 to 5 months

## 2019-12-05 MED ORDER — METFORMIN HCL 1000 MG PO TABS
1000.0000 mg | ORAL_TABLET | Freq: Two times a day (BID) | ORAL | 1 refills | Status: DC
Start: 2019-12-05 — End: 2020-06-20

## 2019-12-05 NOTE — Addendum Note (Signed)
Addended byDamita Dunnings D on: 12/05/2019 10:08 AM   Modules accepted: Orders

## 2019-12-19 ENCOUNTER — Ambulatory Visit: Payer: Medicare Other | Admitting: Internal Medicine

## 2019-12-29 ENCOUNTER — Other Ambulatory Visit: Payer: Self-pay | Admitting: Internal Medicine

## 2020-01-08 DIAGNOSIS — Z20822 Contact with and (suspected) exposure to covid-19: Secondary | ICD-10-CM | POA: Diagnosis not present

## 2020-02-07 ENCOUNTER — Ambulatory Visit (INDEPENDENT_AMBULATORY_CARE_PROVIDER_SITE_OTHER): Payer: Medicare Other

## 2020-02-07 ENCOUNTER — Other Ambulatory Visit: Payer: Self-pay

## 2020-02-07 DIAGNOSIS — Z23 Encounter for immunization: Secondary | ICD-10-CM

## 2020-02-13 ENCOUNTER — Other Ambulatory Visit: Payer: Self-pay | Admitting: Internal Medicine

## 2020-02-22 ENCOUNTER — Other Ambulatory Visit: Payer: Self-pay | Admitting: Internal Medicine

## 2020-02-22 DIAGNOSIS — Z1231 Encounter for screening mammogram for malignant neoplasm of breast: Secondary | ICD-10-CM

## 2020-02-29 ENCOUNTER — Encounter: Payer: Self-pay | Admitting: Internal Medicine

## 2020-03-26 ENCOUNTER — Other Ambulatory Visit: Payer: Self-pay | Admitting: Internal Medicine

## 2020-04-03 ENCOUNTER — Other Ambulatory Visit: Payer: Self-pay

## 2020-04-03 ENCOUNTER — Ambulatory Visit: Payer: Medicare Other | Admitting: Internal Medicine

## 2020-04-03 ENCOUNTER — Ambulatory Visit
Admission: RE | Admit: 2020-04-03 | Discharge: 2020-04-03 | Disposition: A | Discharge status: Home / Self Care | Payer: Medicare Other | Source: Ambulatory Visit | Attending: Internal Medicine | Admitting: Internal Medicine

## 2020-04-03 DIAGNOSIS — Z1231 Encounter for screening mammogram for malignant neoplasm of breast: Secondary | ICD-10-CM

## 2020-04-22 ENCOUNTER — Other Ambulatory Visit: Payer: Self-pay | Admitting: Internal Medicine

## 2020-04-22 DIAGNOSIS — Z20822 Contact with and (suspected) exposure to covid-19: Secondary | ICD-10-CM | POA: Diagnosis not present

## 2020-05-07 ENCOUNTER — Ambulatory Visit: Payer: Medicare Other | Admitting: Internal Medicine

## 2020-05-10 ENCOUNTER — Other Ambulatory Visit: Payer: Self-pay | Admitting: Internal Medicine

## 2020-05-12 MED ORDER — METOPROLOL TARTRATE 50 MG PO TABS: 100.0000 mg | ORAL_TABLET | Freq: Two times a day (BID) | ORAL | 0 refills | Status: DC

## 2020-05-12 MED ORDER — ATORVASTATIN CALCIUM 80 MG PO TABS: 80.0000 mg | ORAL_TABLET | Freq: Every day | ORAL | 0 refills | Status: DC

## 2020-05-12 NOTE — Addendum Note (Signed)
Addended byDamita Dunnings D on: 05/12/2020 11:41 AM   Modules accepted: Orders

## 2020-05-12 NOTE — Telephone Encounter (Signed)
E-scribe down. Rx's printed and faxed to [facility]

## 2020-06-20 ENCOUNTER — Other Ambulatory Visit: Payer: Self-pay | Admitting: Internal Medicine

## 2020-06-30 ENCOUNTER — Encounter: Payer: Self-pay | Admitting: Internal Medicine

## 2020-07-09 ENCOUNTER — Ambulatory Visit (INDEPENDENT_AMBULATORY_CARE_PROVIDER_SITE_OTHER): Payer: Medicare Other | Admitting: Internal Medicine

## 2020-07-09 ENCOUNTER — Other Ambulatory Visit: Payer: Self-pay

## 2020-07-09 VITALS — BP 146/75 | HR 51 | Temp 97.5°F | Wt 199.0 lb

## 2020-07-09 DIAGNOSIS — I779 Disorder of arteries and arterioles, unspecified: Secondary | ICD-10-CM

## 2020-07-09 DIAGNOSIS — E119 Type 2 diabetes mellitus without complications: Secondary | ICD-10-CM | POA: Diagnosis not present

## 2020-07-09 DIAGNOSIS — D649 Anemia, unspecified: Secondary | ICD-10-CM

## 2020-07-09 DIAGNOSIS — Z794 Long term (current) use of insulin: Secondary | ICD-10-CM | POA: Diagnosis not present

## 2020-07-09 DIAGNOSIS — Z780 Asymptomatic menopausal state: Secondary | ICD-10-CM

## 2020-07-09 DIAGNOSIS — E785 Hyperlipidemia, unspecified: Secondary | ICD-10-CM | POA: Diagnosis not present

## 2020-07-09 DIAGNOSIS — I10 Essential (primary) hypertension: Secondary | ICD-10-CM

## 2020-07-09 MED ORDER — ESOMEPRAZOLE MAGNESIUM 40 MG PO CPDR: 40.0000 mg | DELAYED_RELEASE_CAPSULE | Freq: Every day | ORAL | 3 refills | Status: DC

## 2020-07-09 NOTE — Progress Notes (Signed)
Subjective:    Patient ID: Casey Bryan, female    DOB: 08/14/1945, 75 y.o.   MRN: [id number]  DOS:  07/09/2020 Type of visit - description: 52-month follow-up, here with her husband and daughter  Since the last office visit she is feeling well. Did report right-sided buttock pain with some radiation to the posterior aspect of the right leg. Pain started a week ago, at times the pain was severe. It increases  by moving her leg  -like crossing her legs-and sometimes when she turns on her bed. Not worse by  walking. Denies any rash.  No injury.  Ambulatory BPs in the 140s over 70s.    Review of Systems Denies chest pain or difficulty breathing No vaginal bleeding or vaginal discharge No nausea, vomiting, diarrhea  Past Medical History:  Diagnosis Date   Amaurosis fugax 12/12/2015   Diabetes mellitus    GERD (gastroesophageal reflux disease)    Glaucoma 04-2012   Hyperlipemia    Hypertension     Past Surgical History:  Procedure Laterality Date   NO PAST SURGERIES      Allergies as of 07/09/2020      Reactions   Prevnar [pneumococcal 13-val Conj Vacc] Nausea And Vomiting, Other (See Comments)   Fever of 102.    Clarithromycin [clarithromycin]    Contradictions w/Vytorin   Penicillins    Tetanus-diphtheria Toxoids Td    REACTION: see OV note  02-11-10      Medication List       Accurate as of July 09, 2020 11:59 PM. If you have any questions, ask your nurse or doctor.        STOP taking these medications   dicyclomine 10 MG capsule Commonly known as: Bentyl Stopped by: Kathlene November, MD   ondansetron 4 MG disintegrating tablet Commonly known as: Zofran ODT Stopped by: Kathlene November, MD     TAKE these medications   [facility] Redi-Code [place] by Does not apply route. Reported on 04/22/2015   aspirin EC 81 MG tablet Take 81 mg by mouth daily.   atorvastatin 80 MG tablet Commonly known as: LIPITOR Take 1 tablet (80 mg total) by mouth daily.   CALCIUM 1200  PO Take 1,000 mg by mouth.   chlorthalidone 25 MG tablet Commonly known as: HYGROTON Take 1 tablet (25 mg total) by mouth daily.   esomeprazole 40 MG capsule Commonly known as: NEXIUM Take 1 capsule (40 mg total) by mouth daily before breakfast.   Eye Vitamins Caps Take 1 capsule by mouth daily.   irbesartan 300 MG tablet Commonly known as: AVAPRO Take 1 tablet (300 mg total) by mouth daily.   lidocaine 5 % ointment Commonly known as: XYLOCAINE Apply 1 application topically 2 (two) times daily as needed.   metFORMIN 1000 MG tablet Commonly known as: GLUCOPHAGE Take 1 tablet (1,000 mg total) by mouth 2 (two) times daily with a meal.   metoprolol tartrate 50 MG tablet Commonly known as: LOPRESSOR Take 2 tablets (100 mg total) by mouth 2 (two) times daily.   multivitamin tablet Take 1 tablet by mouth daily.   OneTouch Delica Lancets 95J Misc USE TO CHECK BLOOD SUGARS NO MORE THAN TWICE DAILY   OneTouch Ultra test strip Generic drug: glucose blood CHECK BLOOD SUGAR NO MORE THAN TWICE DAILY          Objective:   Physical Exam BP (!) 146/75 (BP Location: Left Arm, Patient Position: Sitting, Cuff Size: Large)    Pulse Marland Kitchen)  51    Temp (!) 97.5 F (36.4 C) (Temporal)    Wt 199 lb (90.3 kg)    SpO2 100%    BMI 34.16 kg/m  General:   Well developed, NAD, BMI noted.  HEENT:  Normocephalic . Face symmetric, atraumatic Neck: No thyromegaly Carotid arteries: good pulses, no bruit Lungs:  CTA B Normal respiratory effort, no intercostal retractions, no accessory muscle use. Heart: RRR,  no murmur.  Abdomen:  Not distended, soft, non-tender. No rebound or rigidity.   Skin: Not pale. Not jaundice Lower extremities: no pretibial edema bilaterally  Neurologic:  alert & oriented X3.  Speech normal, gait appropriate for age and unassisted Motor and DTR symmetric, straight leg test negative for Psych--  Cognition and judgment appear intact.  Cooperative with normal  attention span and concentration.  Behavior appropriate. No anxious or depressed appearing.     Assessment     Assessment: DM HTN (intolerant amlodipine 11-2015 and 2018) Hyperlipidemia Glaucoma dx 2014, cataracts NEURO: L amaurosix fugax 03-2015, CVA -- saw opthalmology, started ASA  (02-2015); echo 04-09-15: nl EF, grade 2 Diast disfx -- saw neuro: Probable cryptogenic  --Headaches: Sees  neurology   CV: Carotid artery disease:    [place] 04-2015: Progression of the right ICA now 40-59%. Stable left ICA 1-39%. [place] 02/2018 : carotids essentially normal GI: -GERD -Anemia: EGD colonoscopy 2017, Dr. Sherran Needs polyp removed + Covid 04-2019  PLAN: DM: Continue metoprolol, check A1c, TSH. HTN: Ambulatory BPs in the 140/80, for now continue chlorthalidone, Avapro, metoprolol.  Check a BMP History of anemia: Check a CBC, iron, ferritin.  No GI symptoms. Carotid artery disease: A carotid US was abnormal at some point, on follow-up there was no obstruction.  Plan: Recheck carotid ultrasound Back pain: Started a week ago, some radicular symptoms, neuro exam normal, recommend Tylenol, rest, call if not better Preventive care reviewed RTC 6 to 8 months     This visit occurred during the SARS-CoV-2 public health emergency.  Safety protocols were in place, including screening questions prior to the visit, additional usage of staff PPE, and extensive cleaning of exam room while observing appropriate contact time as indicated for disinfecting solutions.

## 2020-07-09 NOTE — Patient Instructions (Addendum)
Per our records you are due for an eye exam. Please contact your eye doctor to schedule an appointment. Please have them send copies of your office visit notes to [place]. Our fax number is [phone].   Check the  blood pressure   BP GOAL is between 110/65 and  140/85. If it is consistently higher or lower, let me know    GO TO THE LAB : Get the blood work     [place], [place] back for a checkup in 6 to 8 months   STOP BY THE FIRST FLOOR: Schedule a bone density test    Advanced care planning ("Living will", "[place] of attorney")  Advance care planning is a process that supports adults in  understanding and sharing their preferences regarding future medical care.   The patient's preferences are recorded in documents called Advance Directives.    Advanced directives are completed (and can be modified at any time) while the patient is in full mental capacity.   The documentation should be available at all times to the patient, the family and the healthcare providers.  Bring in a copy to be scanned in your chart is an excellent idea and is recommended   This legal documents direct treatment decision making and/or appoint a surrogate to make the decision if the patient is not capable to do so.    Advance directives can be documented in many types of formats,  documents have names such as:  Lliving will  Durable power of attorney for healthcare (healthcare proxy or healthcare power of attorney)  Combined directives  Physician orders for life-sustaining treatment    More information at:  [email or web address]

## 2020-07-10 ENCOUNTER — Ambulatory Visit: Payer: Medicare Other | Admitting: Internal Medicine

## 2020-07-10 LAB — CBC WITH DIFFERENTIAL/PLATELET
Basophils Absolute: 0 10*3/uL (ref 0.0–0.1)
Basophils Relative: 0.6 % (ref 0.0–3.0)
Eosinophils Absolute: 0.1 10*3/uL (ref 0.0–0.7)
Eosinophils Relative: 1.3 % (ref 0.0–5.0)
HCT: 34.4 % — ABNORMAL LOW (ref 36.0–46.0)
Hemoglobin: 11.7 g/dL — ABNORMAL LOW (ref 12.0–15.0)
Lymphocytes Relative: 30.3 % (ref 12.0–46.0)
Lymphs Abs: 1.9 10*3/uL (ref 0.7–4.0)
MCHC: 33.9 g/dL (ref 30.0–36.0)
MCV: 85.4 fl (ref 78.0–100.0)
Monocytes Absolute: 0.4 10*3/uL (ref 0.1–1.0)
Monocytes Relative: 7.1 % (ref 3.0–12.0)
Neutro Abs: 3.7 10*3/uL (ref 1.4–7.7)
Neutrophils Relative %: 60.7 % (ref 43.0–77.0)
Platelets: 225 10*3/uL (ref 150.0–400.0)
RBC: 4.04 Mil/uL (ref 3.87–5.11)
RDW: 14.6 % (ref 11.5–15.5)
WBC: 6.1 10*3/uL (ref 4.0–10.5)

## 2020-07-10 LAB — HEMOGLOBIN A1C: Hgb A1c MFr Bld: 6.3 % (ref 4.6–6.5)

## 2020-07-10 LAB — BASIC METABOLIC PANEL
BUN: 19 mg/dL (ref 6–23)
CO2: 31 mEq/L (ref 19–32)
Calcium: 9 mg/dL (ref 8.4–10.5)
Chloride: 98 mEq/L (ref 96–112)
Creatinine, Ser: 0.9 mg/dL (ref 0.40–1.20)
GFR: 63.29 mL/min (ref >60.00)
Glucose, Bld: 87 mg/dL (ref 70–99)
Potassium: 3.8 mEq/L (ref 3.5–5.1)
Sodium: 138 mEq/L (ref 135–145)

## 2020-07-10 LAB — FERRITIN: Ferritin: 26.1 ng/mL (ref 10.0–291.0)

## 2020-07-10 LAB — IRON: Iron: 63 ug/dL (ref 42–145)

## 2020-07-10 LAB — TSH: TSH: 0.87 u[IU]/mL (ref 0.35–4.50)

## 2020-07-11 NOTE — Assessment & Plan Note (Signed)
--  Td 2011: had a reaction -PNM 23: 2014; prevnar 2015 (had a reaction) - shingrix : not interested  - covid vax x 3 - (+) FH CV disease - [facility]: Cscope Dr Oletta Lamas ~ 2008, normal, cscope again 12-2011, + polyps, cscope 08-2015, next per GI  -Female care: [facility] 03-2020.  Has not seen gyn lately , will call when/if ready for referral (no h/o  abnormal PAPs)  Last DEXA 12-2015: T score -1.5.  Repeat DEXA

## 2020-07-11 NOTE — Assessment & Plan Note (Signed)
DM: Continue metoprolol, check A1c, TSH. HTN: Ambulatory BPs in the 140/80, for now continue chlorthalidone, Avapro, metoprolol.  Check a BMP History of anemia: Check a CBC, iron, ferritin.  No GI symptoms. Carotid artery disease: A carotid US was abnormal at some point, on follow-up there was no obstruction.  Plan: Recheck carotid ultrasound Back pain: Started a week ago, some radicular symptoms, neuro exam normal, recommend Tylenol, rest, call if not better Preventive care reviewed RTC 6 to 8 months

## 2020-07-17 ENCOUNTER — Ambulatory Visit (HOSPITAL_BASED_OUTPATIENT_CLINIC_OR_DEPARTMENT_OTHER)
Admission: RE | Admit: 2020-07-17 | Discharge: 2020-07-17 | Disposition: A | Discharge status: Home / Self Care | Payer: Medicare Other | Source: Ambulatory Visit | Attending: Internal Medicine | Admitting: Internal Medicine

## 2020-07-17 ENCOUNTER — Other Ambulatory Visit: Payer: Self-pay

## 2020-07-17 DIAGNOSIS — Z780 Asymptomatic menopausal state: Secondary | ICD-10-CM | POA: Diagnosis not present

## 2020-07-17 DIAGNOSIS — M85851 Other specified disorders of bone density and structure, right thigh: Secondary | ICD-10-CM | POA: Diagnosis not present

## 2020-07-18 ENCOUNTER — Other Ambulatory Visit: Payer: Self-pay | Admitting: Internal Medicine

## 2020-07-30 ENCOUNTER — Other Ambulatory Visit: Payer: Self-pay

## 2020-07-30 ENCOUNTER — Ambulatory Visit (HOSPITAL_COMMUNITY)
Admission: RE | Admit: 2020-07-30 | Discharge: 2020-07-30 | Disposition: A | Discharge status: Home / Self Care | Payer: Medicare Other | Source: Ambulatory Visit | Attending: Cardiology | Admitting: Cardiology

## 2020-07-30 DIAGNOSIS — I779 Disorder of arteries and arterioles, unspecified: Secondary | ICD-10-CM

## 2020-08-08 ENCOUNTER — Other Ambulatory Visit: Payer: Self-pay | Admitting: Internal Medicine

## 2020-09-16 ENCOUNTER — Other Ambulatory Visit: Payer: Self-pay | Admitting: Internal Medicine

## 2020-10-08 DIAGNOSIS — H5213 Myopia, bilateral: Secondary | ICD-10-CM | POA: Diagnosis not present

## 2020-10-08 DIAGNOSIS — E119 Type 2 diabetes mellitus without complications: Secondary | ICD-10-CM | POA: Diagnosis not present

## 2020-10-08 DIAGNOSIS — H524 Presbyopia: Secondary | ICD-10-CM | POA: Diagnosis not present

## 2020-10-08 DIAGNOSIS — H25813 Combined forms of age-related cataract, bilateral: Secondary | ICD-10-CM | POA: Diagnosis not present

## 2020-10-08 DIAGNOSIS — H52223 Regular astigmatism, bilateral: Secondary | ICD-10-CM | POA: Diagnosis not present

## 2020-10-08 DIAGNOSIS — H04123 Dry eye syndrome of bilateral lacrimal glands: Secondary | ICD-10-CM | POA: Diagnosis not present

## 2020-10-08 DIAGNOSIS — H353131 Nonexudative age-related macular degeneration, bilateral, early dry stage: Secondary | ICD-10-CM | POA: Diagnosis not present

## 2020-10-08 LAB — HM DIABETES EYE EXAM

## 2020-10-21 DIAGNOSIS — N390 Urinary tract infection, site not specified: Secondary | ICD-10-CM | POA: Diagnosis not present

## 2020-10-31 ENCOUNTER — Other Ambulatory Visit: Payer: Self-pay

## 2020-10-31 ENCOUNTER — Encounter: Payer: Self-pay | Admitting: Internal Medicine

## 2020-10-31 ENCOUNTER — Ambulatory Visit (INDEPENDENT_AMBULATORY_CARE_PROVIDER_SITE_OTHER): Payer: Medicare Other | Admitting: Internal Medicine

## 2020-10-31 VITALS — BP 126/64 | HR 58 | Temp 97.7°F | Resp 18 | Ht 64.0 in | Wt 196.4 lb

## 2020-10-31 DIAGNOSIS — R634 Abnormal weight loss: Secondary | ICD-10-CM

## 2020-10-31 DIAGNOSIS — N390 Urinary tract infection, site not specified: Secondary | ICD-10-CM

## 2020-10-31 DIAGNOSIS — Z7185 Encounter for immunization safety counseling: Secondary | ICD-10-CM

## 2020-10-31 NOTE — Progress Notes (Signed)
Subjective:    Patient ID: Casey Bryan, female    DOB: 06-10-1945, 75 y.o.   MRN: [id number]  DOS:  10/31/2020 Type of visit - description: Acute visit  She had dysuria, went to urgent care 10/21/2020, UA, urine culture were done, Dx with a UTI, was Rx antibiotics, symptoms are better, she has only mild dysuria now. Denies any vaginal discharge or rash.  Also has noted weight loss. Reports that she feels well, is cooking healthier, appetite is just not the same as before. Denies postprandial abdominal pain nausea or vomiting.  Wt Readings from Last 3 Encounters:  10/31/20 196 lb 6 oz (89.1 kg)  07/09/20 199 lb (90.3 kg)  12/03/19 212 lb 8 oz (96.4 kg)    Review of Systems No fever chills No abdominal pain  Past Medical History:  Diagnosis Date   Amaurosis fugax 12/12/2015   Diabetes mellitus    GERD (gastroesophageal reflux disease)    Glaucoma 04-2012   Hyperlipemia    Hypertension     Past Surgical History:  Procedure Laterality Date   NO PAST SURGERIES      Allergies as of 10/31/2020       Reactions   Prevnar [pneumococcal 13-val Conj Vacc] Nausea And Vomiting, Other (See Comments)   Fever of 102.    Clarithromycin [clarithromycin]    Contradictions w/Vytorin   Penicillins    Tetanus-diphtheria Toxoids Td    REACTION: see OV note  02-11-10        Medication List        Accurate as of October 31, 2020 11:59 PM. If you have any questions, ask your nurse or doctor.          Advocate Redi-Code [place] by Does not apply route. Reported on 04/22/2015   aspirin EC 81 MG tablet Take 81 mg by mouth daily.   atorvastatin 80 MG tablet Commonly known as: LIPITOR Take 1 tablet (80 mg total) by mouth daily.   CALCIUM 1200 PO Take 1,000 mg by mouth.   chlorthalidone 25 MG tablet Commonly known as: HYGROTON Take 1 tablet (25 mg total) by mouth daily.   esomeprazole 40 MG capsule Commonly known as: NEXIUM Take 1 capsule (40 mg total) by mouth daily before  breakfast.   Eye Vitamins Caps Take 1 capsule by mouth daily.   irbesartan 300 MG tablet Commonly known as: AVAPRO Take 1 tablet (300 mg total) by mouth daily.   lidocaine 5 % ointment Commonly known as: XYLOCAINE Apply 1 application topically 2 (two) times daily as needed.   metFORMIN 1000 MG tablet Commonly known as: GLUCOPHAGE Take 1 tablet (1,000 mg total) by mouth 2 (two) times daily with a meal.   metoprolol tartrate 50 MG tablet Commonly known as: LOPRESSOR Take 2 tablets (100 mg total) by mouth 2 (two) times daily.   multivitamin tablet Take 1 tablet by mouth daily.   OneTouch Delica Lancets 79T Misc USE TO CHECK BLOOD SUGARS NO MORE THAN TWICE DAILY   OneTouch Ultra test strip Generic drug: glucose blood CHECK BLOOD SUGAR NO MORE THAN TWICE DAILY           Objective:   Physical Exam BP 126/64 (BP Location: Left Arm, Patient Position: Sitting, Cuff Size: Normal)   Pulse (!) 58   Temp 97.7 F (36.5 C) (Oral)   Resp 18   Ht 5\' 4"  (1.626 m)   Wt 196 lb 6 oz (89.1 kg)   SpO2 98%   BMI 33.71  kg/m  General:   Well developed, NAD, BMI noted.  HEENT:  Normocephalic . Face symmetric, atraumatic Abdomen:  Not distended, soft, non-tender. No rebound or rigidity. Skin: Not pale. Not jaundice DM foot exam: No pitting edema, good pedal pulses, pinprick examination normal Neurologic:  alert & oriented X3.  Speech normal, gait appropriate for age and unassisted Psych--  Cognition and judgment appear intact.  Cooperative with normal attention span and concentration.  Behavior appropriate. No anxious or depressed appearing.     Assessment      Assessment: DM HTN (intolerant amlodipine 11-2015 and 2018) Hyperlipidemia Glaucoma dx 2014, cataracts NEURO: L amaurosix fugax 03-2015, CVA -- saw opthalmology, started ASA  (02-2015); echo 04-09-15: nl EF, grade 2 Diast disfx -- saw neuro: Probable cryptogenic  --Headaches: Sees  neurology   CV: Carotid  artery disease:    [place] 04-2015: Progression of the right ICA now 40-59%. Stable left ICA 1-39%. [place] 02/2018 : carotids essentially normal GI: -GERD -Anemia: EGD colonoscopy 2017, Dr. Oletta Lamas,  Laray Anger polyp removed + Covid 04-2019  PLAN: UTI: Recently Dx at the urgent care, symptoms decreased but not gone.  Check a UA urine culture, further advised with results Weight loss: Reports some weight loss, she actually feels well, is cooking healthier, praised. DM: Foot exam negative. Preventive care:  Recommend Shingrix: Declined   Recommend COVID-vaccine #4: She is somewhat hesitant. RTC scheduled for September     This visit occurred during the SARS-CoV-2 public health emergency.  Safety protocols were in place, including screening questions prior to the visit, additional usage of staff PPE, and extensive cleaning of exam room while observing appropriate contact time as indicated for disinfecting solutions.

## 2020-10-31 NOTE — Patient Instructions (Signed)
Please consider get a COVID-vaccine #4  Go to the lab and provide a urine sample  See you in September, call sooner if needed

## 2020-11-01 LAB — URINALYSIS, ROUTINE W REFLEX MICROSCOPIC
Bacteria, UA: NONE SEEN /HPF
Bilirubin Urine: NEGATIVE
Glucose, UA: NEGATIVE
Hgb urine dipstick: NEGATIVE
Hyaline Cast: NONE SEEN /LPF
Ketones, ur: NEGATIVE
Nitrite: NEGATIVE
Protein, ur: NEGATIVE
Specific Gravity, Urine: 1.018 (ref 1.001–1.035)
Squamous Epithelial / HPF: NONE SEEN /HPF (ref <=5)
pH: 6.5 (ref 5.0–8.0)

## 2020-11-01 LAB — URINE CULTURE
MICRO NUMBER:: 12124904
SPECIMEN QUALITY:: ADEQUATE

## 2020-11-01 LAB — MICROSCOPIC MESSAGE

## 2020-11-02 NOTE — Assessment & Plan Note (Signed)
UTI: Recently Dx at the urgent care, symptoms decreased but not gone.  Check a UA urine culture, further advised with results Weight loss: Reports some weight loss, she actually feels well, is cooking healthier, praised. DM: Foot exam negative. Preventive care:  Recommend Shingrix: Declined   Recommend COVID-vaccine #4: She is somewhat hesitant. RTC scheduled for September

## 2020-11-13 DIAGNOSIS — Z20822 Contact with and (suspected) exposure to covid-19: Secondary | ICD-10-CM | POA: Diagnosis not present

## 2020-11-25 ENCOUNTER — Encounter: Payer: Self-pay | Admitting: Internal Medicine

## 2020-12-17 DIAGNOSIS — H5213 Myopia, bilateral: Secondary | ICD-10-CM | POA: Diagnosis not present

## 2020-12-17 DIAGNOSIS — H35362 Drusen (degenerative) of macula, left eye: Secondary | ICD-10-CM | POA: Diagnosis not present

## 2020-12-17 DIAGNOSIS — H25813 Combined forms of age-related cataract, bilateral: Secondary | ICD-10-CM | POA: Diagnosis not present

## 2020-12-17 DIAGNOSIS — E1165 Type 2 diabetes mellitus with hyperglycemia: Secondary | ICD-10-CM | POA: Diagnosis not present

## 2020-12-17 DIAGNOSIS — H04123 Dry eye syndrome of bilateral lacrimal glands: Secondary | ICD-10-CM | POA: Diagnosis not present

## 2020-12-17 LAB — HM DIABETES EYE EXAM

## 2020-12-23 DIAGNOSIS — H25811 Combined forms of age-related cataract, right eye: Secondary | ICD-10-CM | POA: Diagnosis not present

## 2020-12-23 DIAGNOSIS — H25813 Combined forms of age-related cataract, bilateral: Secondary | ICD-10-CM | POA: Diagnosis not present

## 2021-01-06 ENCOUNTER — Ambulatory Visit: Payer: Medicare Other | Admitting: Internal Medicine

## 2021-01-06 DIAGNOSIS — H25811 Combined forms of age-related cataract, right eye: Secondary | ICD-10-CM | POA: Diagnosis not present

## 2021-01-16 ENCOUNTER — Other Ambulatory Visit: Payer: Self-pay | Admitting: Internal Medicine

## 2021-01-31 ENCOUNTER — Other Ambulatory Visit: Payer: Self-pay | Admitting: Internal Medicine

## 2021-02-10 DIAGNOSIS — U071 COVID-19: Secondary | ICD-10-CM | POA: Diagnosis not present

## 2021-03-03 ENCOUNTER — Ambulatory Visit (INDEPENDENT_AMBULATORY_CARE_PROVIDER_SITE_OTHER): Payer: Medicare Other | Admitting: Internal Medicine

## 2021-03-03 ENCOUNTER — Encounter: Payer: Self-pay | Admitting: Internal Medicine

## 2021-03-03 ENCOUNTER — Other Ambulatory Visit: Payer: Self-pay

## 2021-03-03 VITALS — BP 136/84 | HR 62 | Temp 98.3°F | Resp 18 | Ht 64.0 in | Wt 185.2 lb

## 2021-03-03 DIAGNOSIS — I10 Essential (primary) hypertension: Secondary | ICD-10-CM | POA: Diagnosis not present

## 2021-03-03 DIAGNOSIS — Z794 Long term (current) use of insulin: Secondary | ICD-10-CM | POA: Diagnosis not present

## 2021-03-03 DIAGNOSIS — E119 Type 2 diabetes mellitus without complications: Secondary | ICD-10-CM

## 2021-03-03 DIAGNOSIS — Z23 Encounter for immunization: Secondary | ICD-10-CM | POA: Diagnosis not present

## 2021-03-03 DIAGNOSIS — E785 Hyperlipidemia, unspecified: Secondary | ICD-10-CM

## 2021-03-03 LAB — COMPREHENSIVE METABOLIC PANEL
ALT: 12 U/L (ref 0–35)
AST: 16 U/L (ref 0–37)
Albumin: 4.4 g/dL (ref 3.5–5.2)
Alkaline Phosphatase: 47 U/L (ref 39–117)
BUN: 20 mg/dL (ref 6–23)
CO2: 32 mEq/L (ref 19–32)
Calcium: 9.4 mg/dL (ref 8.4–10.5)
Chloride: 95 mEq/L — ABNORMAL LOW (ref 96–112)
Creatinine, Ser: 0.88 mg/dL (ref 0.40–1.20)
GFR: 64.72 mL/min (ref >60.00)
Glucose, Bld: 112 mg/dL — ABNORMAL HIGH (ref 70–99)
Potassium: 3.6 mEq/L (ref 3.5–5.1)
Sodium: 136 mEq/L (ref 135–145)
Total Bilirubin: 0.8 mg/dL (ref 0.2–1.2)
Total Protein: 7 g/dL (ref 6.0–8.3)

## 2021-03-03 LAB — HEMOGLOBIN A1C: Hgb A1c MFr Bld: 5.9 % (ref 4.6–6.5)

## 2021-03-03 LAB — LIPID PANEL
Cholesterol: 152 mg/dL (ref 0–200)
HDL: 52.8 mg/dL (ref >39.00)
LDL Cholesterol: 83 mg/dL (ref 0–99)
NonHDL: 98.97
Total CHOL/HDL Ratio: 3
Triglycerides: 81 mg/dL (ref 0.0–149.0)
VLDL: 16.2 mg/dL (ref 0.0–40.0)

## 2021-03-03 NOTE — Patient Instructions (Signed)
Check the  blood pressure weekly   BP GOAL is between 110/65 and  135/85. If it is consistently higher or lower, let me know     GO TO THE LAB : Get the blood work     [place], [place] back for a physical in 5 to 6 months

## 2021-03-03 NOTE — Progress Notes (Signed)
Subjective:    Patient ID: Casey Bryan, female    DOB: October 05, 1945, 75 y.o.   MRN: [id number]  DOS:  03/03/2021 Type of visit - description: f/u  Today with talk about diabetes, hypertension, high cholesterol and vaccines. In general feeling great. Has changed her diet, + portion control, has lost some weight. CBGs were previously in the 115 range, currently 99, 100.   Wt Readings from Last 3 Encounters:  03/03/21 185 lb 4 oz (84 kg)  10/31/20 196 lb 6 oz (89.1 kg)  07/09/20 199 lb (90.3 kg)     Review of Systems Denies any nausea, vomiting, diarrhea. No chest pain or difficulty  Past Medical History:  Diagnosis Date   Amaurosis fugax 12/12/2015   Diabetes mellitus    GERD (gastroesophageal reflux disease)    Glaucoma 04-2012   Hyperlipemia    Hypertension     Past Surgical History:  Procedure Laterality Date   NO PAST SURGERIES      Allergies as of 03/03/2021       Reactions   Prevnar [pneumococcal 13-val Conj Vacc] Nausea And Vomiting, Other (See Comments)   Fever of 102.    Clarithromycin [clarithromycin]    Contradictions w/Vytorin   Penicillins    Tetanus-diphtheria Toxoids Td    REACTION: see OV note  02-11-10        Medication List        Accurate as of March 03, 2021  8:28 PM. If you have any questions, ask your nurse or doctor.          [facility] Redi-Code [place] by Does not apply route. Reported on 04/22/2015   aspirin EC 81 MG tablet Take 81 mg by mouth daily.   atorvastatin 80 MG tablet Commonly known as: LIPITOR TAKE 1 TABLET(80 MG) BY MOUTH DAILY   CALCIUM 1200 PO Take 1,000 mg by mouth.   chlorthalidone 25 MG tablet Commonly known as: HYGROTON Take 1 tablet (25 mg total) by mouth daily.   esomeprazole 40 MG capsule Commonly known as: NEXIUM Take 1 capsule (40 mg total) by mouth daily before breakfast.   Eye Vitamins Caps Take 1 capsule by mouth daily.   irbesartan 300 MG tablet Commonly known as: AVAPRO Take 1  tablet (300 mg total) by mouth daily.   lidocaine 5 % ointment Commonly known as: XYLOCAINE Apply 1 application topically 2 (two) times daily as needed.   metFORMIN 1000 MG tablet Commonly known as: GLUCOPHAGE TAKE 1 TABLET(1000 MG) BY MOUTH TWICE DAILY WITH A MEAL   metoprolol tartrate 50 MG tablet Commonly known as: LOPRESSOR Take 2 tablets (100 mg total) by mouth 2 (two) times daily.   multivitamin tablet Take 1 tablet by mouth daily.   OneTouch Delica Lancets 91M Misc USE TO CHECK BLOOD SUGARS NO MORE THAN TWICE DAILY   OneTouch Ultra test strip Generic drug: glucose blood CHECK BLOOD SUGAR NO MORE THAN TWICE DAILY           Objective:   Physical Exam BP 136/84 (BP Location: Left Arm, Patient Position: Sitting, Cuff Size: Small)   Pulse 62   Temp 98.3 F (36.8 C) (Oral)   Resp 18   Ht 5\' 4"  (1.626 m)   Wt 185 lb 4 oz (84 kg)   SpO2 94%   BMI 31.80 kg/m  General:   Well developed, NAD, BMI noted. HEENT:  Normocephalic . Face symmetric, atraumatic Lungs:  CTA B Normal respiratory effort, no intercostal retractions, no accessory muscle  use. Heart: RRR,  no murmur.  Lower extremities: no pretibial pitting  edema bilaterally  Skin: Not pale. Not jaundice Neurologic:  alert & oriented X3.  Speech normal, gait appropriate for age and unassisted Psych--  Cognition and judgment appear intact.  Cooperative with normal attention span and concentration.  Behavior appropriate. No anxious or depressed appearing.      Assessment    Assessment: DM HTN (intolerant amlodipine 11-2015 and 2018) Hyperlipidemia Glaucoma dx 2014, cataracts NEURO: L amaurosix fugax 03-2015, CVA -- saw opthalmology, started ASA  (02-2015); echo 04-09-15: nl EF, grade 2 Diast disfx -- saw neuro: Probable cryptogenic  --Headaches: Sees  neurology   CV: Carotid artery disease:    [place] 04-2015: Progression of the right ICA now 40-59%. Stable left ICA 1-39%. [place] 02/2018 : carotids  essentially normal GI: -GERD -Anemia: EGD colonoscopy 2017, Dr. Sherran Needs polyp removed + Covid 04-2019, 01-2021  PLAN: DM: Doing great with diet, + weight loss, CBGs 99, 100.  Patient is praised, continue metformin, check A1c. HTN: BP today is normal, very good ambulatory BPs, continue chlorthalidone, Avapro, metoprolol.  Check CMP High cholesterol: On Lipitor 80 mg, checking LFTs today, check FLP. Carotid artery disease?  Had a carotid ultrasound 07/30/2020: Essentially normal. Preventive care: Flu shot today  Strongly declines further COVID vaccines, reports she had COVID few weeks ago, she feels fully recuperated RTC 6 months CPX     This visit occurred during the SARS-CoV-2 public health emergency.  Safety protocols were in place, including screening questions prior to the visit, additional usage of staff PPE, and extensive cleaning of exam room while observing appropriate contact time as indicated for disinfecting solutions.

## 2021-03-03 NOTE — Assessment & Plan Note (Signed)
Assessment: DM HTN (intolerant amlodipine 11-2015 and 2018) Hyperlipidemia Glaucoma dx 2014, cataracts NEURO: L amaurosix fugax 03-2015, CVA -- saw opthalmology, started ASA  (02-2015); echo 04-09-15: nl EF, grade 2 Diast disfx -- saw neuro: Probable cryptogenic  --Headaches: Sees  neurology   CV: Carotid artery disease:    [place] 04-2015: Progression of the right ICA now 40-59%. Stable left ICA 1-39%. [place] 02/2018 : carotids essentially normal GI: -GERD -Anemia: EGD colonoscopy 2017, Dr. Sherran Needs polyp removed + Covid 04-2019, 01-2021  PLAN: DM: Doing great with diet, + weight loss, CBGs 99, 100.  Patient is praised, continue metformin, check A1c. HTN: BP today is normal, very good ambulatory BPs, continue chlorthalidone, Avapro, metoprolol.  Check CMP High cholesterol: On Lipitor 80 mg, checking LFTs today, check FLP. Carotid artery disease?  Had a carotid ultrasound 07/30/2020: Essentially normal. Preventive care: Flu shot today  Strongly declines further COVID vaccines, reports she had COVID few weeks ago, she feels fully recuperated RTC 6 months CPX

## 2021-03-12 ENCOUNTER — Other Ambulatory Visit: Payer: Self-pay | Admitting: Internal Medicine

## 2021-03-25 ENCOUNTER — Other Ambulatory Visit: Payer: Self-pay | Admitting: Internal Medicine

## 2021-03-25 DIAGNOSIS — Z1231 Encounter for screening mammogram for malignant neoplasm of breast: Secondary | ICD-10-CM

## 2021-04-29 ENCOUNTER — Ambulatory Visit
Admission: RE | Admit: 2021-04-29 | Discharge: 2021-04-29 | Disposition: A | Discharge status: Home / Self Care | Payer: Medicare Other | Source: Ambulatory Visit | Attending: Internal Medicine | Admitting: Internal Medicine

## 2021-04-29 DIAGNOSIS — Z1231 Encounter for screening mammogram for malignant neoplasm of breast: Secondary | ICD-10-CM | POA: Diagnosis not present

## 2021-05-10 ENCOUNTER — Other Ambulatory Visit: Payer: Self-pay | Admitting: Internal Medicine

## 2021-07-14 ENCOUNTER — Other Ambulatory Visit: Payer: Self-pay | Admitting: Internal Medicine

## 2021-08-04 ENCOUNTER — Ambulatory Visit: Payer: Medicare Other | Admitting: Internal Medicine

## 2021-08-05 ENCOUNTER — Encounter: Payer: Self-pay | Admitting: Internal Medicine

## 2021-08-05 ENCOUNTER — Ambulatory Visit (INDEPENDENT_AMBULATORY_CARE_PROVIDER_SITE_OTHER): Payer: Medicare Other | Admitting: Internal Medicine

## 2021-08-05 VITALS — BP 128/70 | HR 57 | Temp 97.8°F | Resp 16 | Ht 64.0 in | Wt 183.4 lb

## 2021-08-05 DIAGNOSIS — I10 Essential (primary) hypertension: Secondary | ICD-10-CM

## 2021-08-05 DIAGNOSIS — Z7185 Encounter for immunization safety counseling: Secondary | ICD-10-CM

## 2021-08-05 DIAGNOSIS — E785 Hyperlipidemia, unspecified: Secondary | ICD-10-CM

## 2021-08-05 DIAGNOSIS — Z794 Long term (current) use of insulin: Secondary | ICD-10-CM | POA: Diagnosis not present

## 2021-08-05 DIAGNOSIS — D649 Anemia, unspecified: Secondary | ICD-10-CM

## 2021-08-05 DIAGNOSIS — E119 Type 2 diabetes mellitus without complications: Secondary | ICD-10-CM

## 2021-08-05 LAB — CBC WITH DIFFERENTIAL/PLATELET
Basophils Absolute: 0 10*3/uL (ref 0.0–0.1)
Basophils Relative: 0.8 % (ref 0.0–3.0)
Eosinophils Absolute: 0.1 10*3/uL (ref 0.0–0.7)
Eosinophils Relative: 1.3 % (ref 0.0–5.0)
HCT: 34.7 % — ABNORMAL LOW (ref 36.0–46.0)
Hemoglobin: 11.4 g/dL — ABNORMAL LOW (ref 12.0–15.0)
Lymphocytes Relative: 25.3 % (ref 12.0–46.0)
Lymphs Abs: 1.3 10*3/uL (ref 0.7–4.0)
MCHC: 32.9 g/dL (ref 30.0–36.0)
MCV: 85.6 fl (ref 78.0–100.0)
Monocytes Absolute: 0.4 10*3/uL (ref 0.1–1.0)
Monocytes Relative: 6.7 % (ref 3.0–12.0)
Neutro Abs: 3.5 10*3/uL (ref 1.4–7.7)
Neutrophils Relative %: 65.9 % (ref 43.0–77.0)
Platelets: 191 10*3/uL (ref 150.0–400.0)
RBC: 4.05 Mil/uL (ref 3.87–5.11)
RDW: 14.8 % (ref 11.5–15.5)
WBC: 5.2 10*3/uL (ref 4.0–10.5)

## 2021-08-05 LAB — LIPID PANEL
Cholesterol: 145 mg/dL (ref 0–200)
HDL: 49.4 mg/dL (ref >39.00)
LDL Cholesterol: 79 mg/dL (ref 0–99)
NonHDL: 95.12
Total CHOL/HDL Ratio: 3
Triglycerides: 82 mg/dL (ref 0.0–149.0)
VLDL: 16.4 mg/dL (ref 0.0–40.0)

## 2021-08-05 LAB — BASIC METABOLIC PANEL
BUN: 25 mg/dL — ABNORMAL HIGH (ref 6–23)
CO2: 31 mEq/L (ref 19–32)
Calcium: 9.3 mg/dL (ref 8.4–10.5)
Chloride: 100 mEq/L (ref 96–112)
Creatinine, Ser: 0.92 mg/dL (ref 0.40–1.20)
GFR: 61.18 mL/min (ref >60.00)
Glucose, Bld: 100 mg/dL — ABNORMAL HIGH (ref 70–99)
Potassium: 3.6 mEq/L (ref 3.5–5.1)
Sodium: 140 mEq/L (ref 135–145)

## 2021-08-05 LAB — HEMOGLOBIN A1C: Hgb A1c MFr Bld: 6.5 % (ref 4.6–6.5)

## 2021-08-05 LAB — IRON: Iron: 90 ug/dL (ref 42–145)

## 2021-08-05 LAB — FERRITIN: Ferritin: 28.3 ng/mL (ref 10.0–291.0)

## 2021-08-05 MED ORDER — METFORMIN HCL 1000 MG PO TABS
ORAL_TABLET | ORAL | 1 refills | Status: DC
Start: 2021-08-05 — End: 2022-04-15

## 2021-08-05 MED ORDER — CHLORTHALIDONE 25 MG PO TABS
ORAL_TABLET | ORAL | 1 refills | Status: DC
Start: 2021-08-05 — End: 2022-01-14

## 2021-08-05 MED ORDER — ONETOUCH DELICA LANCETS 33G MISC: 12 refills | Status: DC

## 2021-08-05 MED ORDER — ATORVASTATIN CALCIUM 80 MG PO TABS: ORAL_TABLET | ORAL | 1 refills | Status: DC

## 2021-08-05 MED ORDER — ONETOUCH ULTRA VI STRP: ORAL_STRIP | 12 refills | Status: DC

## 2021-08-05 NOTE — Assessment & Plan Note (Signed)
DM: Last A1c below 6, currently on metformin twice a day, recheck A1c, she would like to decrease metformin dose if possible. ?HTN: Ambulatory BPs are within normal the majority of times, occasionally in the 150.  Recommend to continue chlorthalidone, avapro, metoprolol.  Check BMP. ?Hyperlipidemia: On atorvastatin 80, checking labs, history of amaurosis fugax, recommend LDL of 70 or less. ?Anemia: Noted on chart review, no GI symptoms.  Checking labs. ?Multiple refills sent ?Preventive care discussed ?RTC 4 months ?

## 2021-08-05 NOTE — Patient Instructions (Addendum)
Please schedule a Medicare Wellness visit.  ? ?Please read information about healthcare power of attorney ? ?Check the  blood pressure regularly ?BP GOAL is between 110/65 and  135/85. ?If it is consistently higher or lower, let me know ? ?  ? ?GO TO THE LAB : Get the blood work   ? ? ?[place], [place] ?Come back for   a checkup in 4 months ? ? ? ?"Living will", "Health Care Power of attorney": Advanced care planning ? ?(If you already have a living will or healthcare power of attorney, please bring the copy to be scanned in your chart.) ? ?Advance care planning is a process that supports adults in  understanding and sharing their preferences regarding future medical care.  ? ?The patient's preferences are recorded in documents called Advance Directives.    ?Advanced directives are completed (and can be modified at any time) while the patient is in full mental capacity.  ? ?The documentation should be available at all times to the patient, the family and the healthcare providers.  ?Bring in a copy to be scanned in your chart is an excellent idea and is recommended  ? ?This legal documents direct treatment decision making and/or appoint a surrogate to make the decision if the patient is not capable to do so.  ? ? ?Advance directives can be documented in many types of formats,  documents have names such as:  ?Lliving will  ?Durable power of attorney for healthcare (healthcare proxy or healthcare power of attorney)  ?Combined directives  ?Physician orders for life-sustaining treatment  ?  ?More information at: ? [email or web address]  ?

## 2021-08-05 NOTE — Progress Notes (Signed)
? ?Subjective:  ? ? Patient ID: Casey Bryan, female    DOB: March 09, 1946, 76 y.o.   MRN: [id number] ? ?DOS:  08/05/2021 ?Type of visit - description: Follow-up ? ?Here for a checkup. ?We addressed all her chronic medical problems. ?We also talk about preventive care. ?She is doing great, still losing some weight. ? ?Denies chest pain or difficulty breathing ?No nausea, vomiting.  No blood in the stools. ?Denies any cough ?Denies any vaginal bleeding or discharge. ? ?Wt Readings from Last 3 Encounters:  ?08/05/21 183 lb 6 oz (83.2 kg)  ?03/03/21 185 lb 4 oz (84 kg)  ?10/31/20 196 lb 6 oz (89.1 kg)  ? ? ? ?Review of Systems ?See above  ? ?Past Medical History:  ?Diagnosis Date  ? Amaurosis fugax 12/12/2015  ? Diabetes mellitus   ? GERD (gastroesophageal reflux disease)   ? Glaucoma 04-2012  ? Hyperlipemia   ? Hypertension   ? ? ?Past Surgical History:  ?Procedure Laterality Date  ? NO PAST SURGERIES    ? ? ?Current Outpatient Medications  ?Medication Instructions  ? aspirin EC 81 mg, Oral, Daily  ? atorvastatin (LIPITOR) 80 MG tablet TAKE 1 TABLET(80 MG) BY MOUTH DAILY  ? Blood Glucose Monitoring Suppl (ADVOCATE REDI-CODE) DEVI Does not apply, Reported on 04/22/2015  ? Calcium Carbonate-Vit D-Min (CALCIUM 1200 PO) 1,000 mg, Oral  ? chlorthalidone (HYGROTON) 25 MG tablet TAKE 1 TABLET(25 MG) BY MOUTH DAILY  ? esomeprazole (NEXIUM) 40 MG capsule TAKE 1 CAPSULE(40 MG) BY MOUTH DAILY BEFORE BREAKFAST  ? glucose blood (ONETOUCH ULTRA) test strip Check blood sugars once daily  ? irbesartan (AVAPRO) 300 MG tablet TAKE 1 TABLET(300 MG) BY MOUTH DAILY  ? lidocaine (XYLOCAINE) 5 % ointment 1 application., Topical, 2 times daily PRN  ? metFORMIN (GLUCOPHAGE) 1000 MG tablet TAKE 1 TABLET(1000 MG) BY MOUTH TWICE DAILY WITH A MEAL  ? metoprolol tartrate (LOPRESSOR) 50 MG tablet TAKE 2 TABLETS BY MOUTH TWICE DAILY  ? Multiple Vitamin (MULTIVITAMIN) tablet 1 tablet, Oral, Daily,    ? Multiple Vitamins-Minerals (EYE VITAMINS) CAPS 1 capsule,  Daily  ? OneTouch Delica Lancets 42P MISC Check blood sugars once daily  ? ? ?   ?Objective:  ? Physical Exam ?BP 128/70 (BP Location: Left Arm, Patient Position: Sitting, Cuff Size: Small)   Pulse (!) 57   Temp 97.8 ?F (36.6 ?C) (Oral)   Resp 16   Ht '5\' 4"'$  (1.626 m)   Wt 183 lb 6 oz (83.2 kg)   SpO2 98%   BMI 31.48 kg/m?  ?General: ?Well developed, NAD, BMI noted ?Neck: No  thyromegaly  ?HEENT:  ?Normocephalic . Face symmetric, atraumatic ?Lungs:  ?CTA B ?Normal respiratory effort, no intercostal retractions, no accessory muscle use. ?Heart: RRR,  no murmur.  ?Abdomen:  ?Not distended, soft, non-tender. No rebound or rigidity.   ?Lower extremities: no pretibial edema bilaterally  ?Skin: Exposed areas without rash. Not pale. Not jaundice ?Neurologic:  ?alert & oriented X3.  ?Speech normal, gait appropriate for age and unassisted ?Strength symmetric and appropriate for age.  ?Psych: ?Cognition and judgment appear intact.  ?Cooperative with normal attention span and concentration.  ?Behavior appropriate. ?No anxious or depressed appearing. ? ?   ?Assessment   ? ? Assessment: ?DM ?HTN (intolerant amlodipine 11-2015 and 2018) ?Hyperlipidemia ?Glaucoma dx 2014, cataracts ?NEURO: ?L amaurosix fugax 03-2015, CVA ?-- saw opthalmology, started ASA  (02-2015); echo 04-09-15: nl EF, grade 2 Diast disfx ?-- saw neuro: Probable cryptogenic  ?--Headaches: Sees  neurology   ?CV: ?Carotid artery disease:    ?[place] 04-2015: Progression of the right ICA now 40-59%. Stable left ICA 1-39%. ?[place] 02/2018 : carotids essentially normal ?GI: ?-GERD ?-Anemia: EGD colonoscopy 2017, Dr. Sherran Needs polyp removed ?+ Covid 04-2019, 01-2021 ? ?PLAN: ?DM: Last A1c below 6, currently on metformin twice a day, recheck A1c, she would like to decrease metformin dose if possible. ?HTN: Ambulatory BPs are within normal the majority of times, occasionally in the 150.  Recommend to continue chlorthalidone, avapro, metoprolol.  Check  BMP. ?Hyperlipidemia: On atorvastatin 80, checking labs, history of amaurosis fugax, recommend LDL of 70 or less. ?Anemia: Noted on chart review, no GI symptoms.  Checking labs. ?Multiple refills sent ?Preventive care discussed ?RTC 4 months ?  ? ? ?This visit occurred during the SARS-CoV-2 public health emergency.  Safety protocols were in place, including screening questions prior to the visit, additional usage of staff PPE, and extensive cleaning of exam room while observing appropriate contact time as indicated for disinfecting solutions.  ? ?

## 2021-08-05 NOTE — Assessment & Plan Note (Signed)
--  Td 2011: had a reaction ?-PNM 23: 2014; prevnar 2015 (had a reaction) ?- shingrix : not interested  ?- covid vax : strongly declined before ?- (+) FH CV disease ?- CCS: ?Cscope Dr Oletta Lamas ~ 2008, normal, cscope again 12-2011, + polyps, cscope 08-2015, no polyps, 5 years per cscope report. Pt aware, unable to schedule that at this point due to a number of issues.  No symptoms. ?-Female care: ?[facility] 04/2021 ([facility]) ?Has not seen gyn lately , no h/o  abnormal PAPs, no sxs, declined further testing   ?DEXA 12-2015: T score -1.5.   March 2022: T score  (-) 1.8.  ?ACP: Information provided ?  ?

## 2021-09-03 ENCOUNTER — Other Ambulatory Visit: Payer: Self-pay | Admitting: Internal Medicine

## 2021-10-06 ENCOUNTER — Encounter: Payer: Self-pay | Admitting: *Deleted

## 2021-11-07 ENCOUNTER — Other Ambulatory Visit: Payer: Self-pay | Admitting: Internal Medicine

## 2021-11-11 LAB — HM DIABETES EYE EXAM

## 2021-11-12 ENCOUNTER — Encounter: Payer: Self-pay | Admitting: Internal Medicine

## 2021-12-23 ENCOUNTER — Ambulatory Visit: Payer: Medicare Other | Admitting: Internal Medicine

## 2022-01-06 ENCOUNTER — Ambulatory Visit (INDEPENDENT_AMBULATORY_CARE_PROVIDER_SITE_OTHER): Payer: Medicare Other | Admitting: Internal Medicine

## 2022-01-06 ENCOUNTER — Encounter: Payer: Self-pay | Admitting: Internal Medicine

## 2022-01-06 VITALS — BP 130/76 | HR 56 | Temp 97.8°F | Resp 16 | Ht 64.0 in | Wt 184.1 lb

## 2022-01-06 DIAGNOSIS — E785 Hyperlipidemia, unspecified: Secondary | ICD-10-CM

## 2022-01-06 DIAGNOSIS — E119 Type 2 diabetes mellitus without complications: Secondary | ICD-10-CM

## 2022-01-06 DIAGNOSIS — Z23 Encounter for immunization: Secondary | ICD-10-CM

## 2022-01-06 DIAGNOSIS — I779 Disorder of arteries and arterioles, unspecified: Secondary | ICD-10-CM

## 2022-01-06 DIAGNOSIS — Z794 Long term (current) use of insulin: Secondary | ICD-10-CM | POA: Diagnosis not present

## 2022-01-06 LAB — BASIC METABOLIC PANEL
BUN: 24 mg/dL — ABNORMAL HIGH (ref 6–23)
CO2: 31 mEq/L (ref 19–32)
Calcium: 9.3 mg/dL (ref 8.4–10.5)
Chloride: 98 mEq/L (ref 96–112)
Creatinine, Ser: 0.95 mg/dL (ref 0.40–1.20)
GFR: 58.69 mL/min — ABNORMAL LOW (ref >60.00)
Glucose, Bld: 91 mg/dL (ref 70–99)
Potassium: 4.1 mEq/L (ref 3.5–5.1)
Sodium: 137 mEq/L (ref 135–145)

## 2022-01-06 LAB — HEMOGLOBIN A1C: Hgb A1c MFr Bld: 6.2 % (ref 4.6–6.5)

## 2022-01-06 LAB — ALT: ALT: 14 U/L (ref 0–35)

## 2022-01-06 LAB — AST: AST: 19 U/L (ref 0–37)

## 2022-01-06 NOTE — Patient Instructions (Addendum)
  Please think about getting a COVID-vaccine booster  Also a new vaccine called RSV  Continue checking your blood sugars  GO TO THE LAB : Get the blood work     Casey Bryan, Casey Bryan back for a checkup in 4 to 5 months

## 2022-01-06 NOTE — Progress Notes (Signed)
Subjective:    Patient ID: Casey Bryan, female    DOB: [birth year], 76 y.o.   MRN: [id number]  DOS:  01/06/2022 Type of visit - description: f/u  Since the last office visit is doing well and has no major concerns. Denies any lower extremity paresthesias Ambulatory CBGs are very good.  Wt Readings from Last 3 Encounters:  01/06/22 184 lb 2 oz (83.5 kg)  08/05/21 183 lb 6 oz (83.2 kg)  03/03/21 185 lb 4 oz (84 kg)   Review of Systems See above   Past Medical History:  Diagnosis Date   Amaurosis fugax 12/12/2015   Diabetes mellitus    GERD (gastroesophageal reflux disease)    Glaucoma 04-2012   Hyperlipemia    Hypertension     Past Surgical History:  Procedure Laterality Date   NO PAST SURGERIES      Current Outpatient Medications  Medication Instructions   aspirin EC 81 mg, Oral, Daily   atorvastatin (LIPITOR) 80 MG tablet TAKE 1 TABLET(80 MG) BY MOUTH DAILY   Blood Glucose Monitoring Suppl (ADVOCATE REDI-CODE) DEVI Does not apply, Reported on 04/22/2015   Calcium Carbonate-Vit D-Min (CALCIUM 1200 PO) 1,000 mg, Oral   chlorthalidone (HYGROTON) 25 MG tablet TAKE 1 TABLET(25 MG) BY MOUTH DAILY   esomeprazole (NEXIUM) 40 MG capsule TAKE 1 CAPSULE(40 MG) BY MOUTH DAILY BEFORE BREAKFAST   glucose blood (ONETOUCH ULTRA) test strip Check blood sugars once daily   irbesartan (AVAPRO) 300 MG tablet TAKE 1 TABLET(300 MG) BY MOUTH DAILY   lidocaine (XYLOCAINE) 5 % ointment 1 application , Topical, 2 times daily PRN   metFORMIN (GLUCOPHAGE) 1000 MG tablet TAKE 1 TABLET(1000 MG) BY MOUTH TWICE DAILY WITH A MEAL   metoprolol tartrate (LOPRESSOR) 100 mg, Oral, 2 times daily   Multiple Vitamin (MULTIVITAMIN) tablet 1 tablet, Oral, Daily,     Multiple Vitamins-Minerals (EYE VITAMINS) CAPS 1 capsule, Daily   OneTouch Delica Lancets 27C MISC Check blood sugars once daily       Objective:   Physical Exam BP 130/76   Pulse (!) 56   Temp 97.8 F (36.6 C) (Oral)   Resp 16   Ht '5\' 4"'$   (1.626 m)   Wt 184 lb 2 oz (83.5 kg)   SpO2 95%   BMI 31.60 kg/m  General:   Well developed, NAD, BMI noted. HEENT:  Normocephalic . Face symmetric, atraumatic Lungs:  CTA B Normal respiratory effort, no intercostal retractions, no accessory muscle use. Heart: RRR,  no murmur.  DM foot exam: No edema, good pedal pulses, pinprick examination normal Skin: Not pale. Not jaundice Neurologic:  alert & oriented X3.  Speech normal, gait appropriate for age and unassisted Psych--  Cognition and judgment appear intact.  Cooperative with normal attention span and concentration.  Behavior appropriate. No anxious or depressed appearing.      Assessment     Assessment: DM HTN (intolerant amlodipine 11-2015 and 2018) Hyperlipidemia Glaucoma dx 2014, cataracts NEURO: L amaurosix fugax 03-2015, CVA -- saw opthalmology, started ASA  (02-2015); echo 04-09-15: nl EF, grade 2 Diast disfx -- saw neuro: Probable cryptogenic  --Headaches: Sees  neurology   CV: Carotid artery disease:    [place] 04-2015: Progression of the right ICA now 40-59%. Stable left ICA 1-39%. [place] 02/2018, 07-30-2020 : carotids essentially normal GI: -GERD -Anemia: EGD colonoscopy 2017, Dr. Sherran Needs polyp removed + Covid 04-2019, 01-2021  PLAN: DM: Currently on metformin, last A1c 6.5, ambulatory CBGs never more than 110.  Recheck A1c. Feet exam negative today. HTN: Seems controlled, continue chlorthalidone, avapro, metoprolol.  Check a BMP High cholesterol: On high intensity statins, LDL 79 (close to goal), no change, check LFTs Vaccine advise: Flu shot today, rec COVID booster, she remains hesitant.  Also advised about RSV vaccine. RTC 4 to 5 months

## 2022-01-06 NOTE — Assessment & Plan Note (Signed)
DM: Currently on metformin, last A1c 6.5, ambulatory CBGs never more than 110.  Recheck A1c. Feet exam negative today. HTN: Seems controlled, continue chlorthalidone, avapro, metoprolol.  Check a BMP High cholesterol: On high intensity statins, LDL 79 (close to goal), no change, check LFTs Vaccine advise: Flu shot today, rec COVID booster, she remains hesitant.  Also advised about RSV vaccine. RTC 4 to 5 months

## 2022-01-11 ENCOUNTER — Encounter: Payer: Self-pay | Admitting: Internal Medicine

## 2022-01-14 ENCOUNTER — Other Ambulatory Visit: Payer: Self-pay | Admitting: Internal Medicine

## 2022-02-01 DIAGNOSIS — H04123 Dry eye syndrome of bilateral lacrimal glands: Secondary | ICD-10-CM | POA: Diagnosis not present

## 2022-02-01 DIAGNOSIS — H25812 Combined forms of age-related cataract, left eye: Secondary | ICD-10-CM | POA: Diagnosis not present

## 2022-02-01 DIAGNOSIS — E1165 Type 2 diabetes mellitus with hyperglycemia: Secondary | ICD-10-CM | POA: Diagnosis not present

## 2022-02-01 LAB — HM DIABETES EYE EXAM

## 2022-02-03 ENCOUNTER — Encounter: Payer: Self-pay | Admitting: Internal Medicine

## 2022-02-12 ENCOUNTER — Other Ambulatory Visit: Payer: Self-pay | Admitting: Internal Medicine

## 2022-02-24 ENCOUNTER — Other Ambulatory Visit: Payer: Self-pay | Admitting: Internal Medicine

## 2022-04-15 ENCOUNTER — Other Ambulatory Visit: Payer: Self-pay | Admitting: Internal Medicine

## 2022-05-06 ENCOUNTER — Other Ambulatory Visit: Payer: Self-pay | Admitting: Internal Medicine

## 2022-05-06 DIAGNOSIS — Z1231 Encounter for screening mammogram for malignant neoplasm of breast: Secondary | ICD-10-CM

## 2022-05-07 ENCOUNTER — Ambulatory Visit
Admission: RE | Admit: 2022-05-07 | Discharge: 2022-05-07 | Disposition: A | Discharge status: Home / Self Care | Payer: Medicare Other | Source: Ambulatory Visit | Attending: Internal Medicine | Admitting: Internal Medicine

## 2022-05-07 DIAGNOSIS — Z1231 Encounter for screening mammogram for malignant neoplasm of breast: Secondary | ICD-10-CM

## 2022-06-08 ENCOUNTER — Ambulatory Visit (INDEPENDENT_AMBULATORY_CARE_PROVIDER_SITE_OTHER): Payer: Medicare Other | Admitting: Internal Medicine

## 2022-06-08 ENCOUNTER — Encounter: Payer: Self-pay | Admitting: Internal Medicine

## 2022-06-08 VITALS — BP 126/74 | HR 59 | Temp 98.4°F | Resp 16 | Ht 64.0 in | Wt 184.2 lb

## 2022-06-08 DIAGNOSIS — E119 Type 2 diabetes mellitus without complications: Secondary | ICD-10-CM | POA: Diagnosis not present

## 2022-06-08 DIAGNOSIS — Z794 Long term (current) use of insulin: Secondary | ICD-10-CM

## 2022-06-08 DIAGNOSIS — I10 Essential (primary) hypertension: Secondary | ICD-10-CM

## 2022-06-08 DIAGNOSIS — E785 Hyperlipidemia, unspecified: Secondary | ICD-10-CM

## 2022-06-08 DIAGNOSIS — R5383 Other fatigue: Secondary | ICD-10-CM

## 2022-06-08 LAB — BASIC METABOLIC PANEL
BUN: 19 mg/dL (ref 6–23)
CO2: 32 mEq/L (ref 19–32)
Calcium: 9.4 mg/dL (ref 8.4–10.5)
Chloride: 98 mEq/L (ref 96–112)
Creatinine, Ser: 0.91 mg/dL (ref 0.40–1.20)
GFR: 61.62 mL/min (ref >60.00)
Glucose, Bld: 102 mg/dL — ABNORMAL HIGH (ref 70–99)
Potassium: 3.9 mEq/L (ref 3.5–5.1)
Sodium: 139 mEq/L (ref 135–145)

## 2022-06-08 LAB — LIPID PANEL
Cholesterol: 133 mg/dL (ref 0–200)
HDL: 49.2 mg/dL (ref >39.00)
LDL Cholesterol: 68 mg/dL (ref 0–99)
NonHDL: 83.32
Total CHOL/HDL Ratio: 3
Triglycerides: 79 mg/dL (ref 0.0–149.0)
VLDL: 15.8 mg/dL (ref 0.0–40.0)

## 2022-06-08 LAB — CBC WITH DIFFERENTIAL/PLATELET
Basophils Absolute: 0 10*3/uL (ref 0.0–0.1)
Basophils Relative: 0.3 % (ref 0.0–3.0)
Eosinophils Absolute: 0.1 10*3/uL (ref 0.0–0.7)
Eosinophils Relative: 2.7 % (ref 0.0–5.0)
HCT: 33.7 % — ABNORMAL LOW (ref 36.0–46.0)
Hemoglobin: 11.2 g/dL — ABNORMAL LOW (ref 12.0–15.0)
Lymphocytes Relative: 30.6 % (ref 12.0–46.0)
Lymphs Abs: 1.5 10*3/uL (ref 0.7–4.0)
MCHC: 33.4 g/dL (ref 30.0–36.0)
MCV: 87.2 fl (ref 78.0–100.0)
Monocytes Absolute: 0.4 10*3/uL (ref 0.1–1.0)
Monocytes Relative: 7.7 % (ref 3.0–12.0)
Neutro Abs: 2.9 10*3/uL (ref 1.4–7.7)
Neutrophils Relative %: 58.7 % (ref 43.0–77.0)
Platelets: 206 10*3/uL (ref 150.0–400.0)
RBC: 3.86 Mil/uL — ABNORMAL LOW (ref 3.87–5.11)
RDW: 14.9 % (ref 11.5–15.5)
WBC: 4.9 10*3/uL (ref 4.0–10.5)

## 2022-06-08 LAB — MICROALBUMIN / CREATININE URINE RATIO
Creatinine,U: 105.1 mg/dL
Microalb Creat Ratio: 3.8 mg/g (ref 0.0–30.0)
Microalb, Ur: 4 mg/dL — ABNORMAL HIGH (ref 0.0–1.9)

## 2022-06-08 LAB — HEMOGLOBIN A1C: Hgb A1c MFr Bld: 6.3 % (ref 4.6–6.5)

## 2022-06-08 NOTE — Progress Notes (Unsigned)
Subjective:    Patient ID: Casey Bryan, female    DOB: 05-26-1945, 77 y.o.   MRN: [id number]  DOS:  06/08/2022 Type of visit - description: Routine checkup  Reports that she is sometimes feels fatigue, usually in the context of cooking  for 25 family members which happened twice a week. Denies fever or chills. No headaches No chest pain no difficulty breathing. Edema at baseline.  Also, for the last month has noted occasional diarrhea, typically has a normal BM every morning but some afternoons has a loose BM. No nausea vomiting, no blood in the stools, no constipation episodes.  Chronic medical problems were addressed   Wt Readings from Last 3 Encounters:  06/08/22 184 lb 4 oz (83.6 kg)  01/06/22 184 lb 2 oz (83.5 kg)  08/05/21 183 lb 6 oz (83.2 kg)    Review of Systems See above   Past Medical History:  Diagnosis Date   Amaurosis fugax 12/12/2015   Diabetes mellitus    GERD (gastroesophageal reflux disease)    Glaucoma 04-2012   Hyperlipemia    Hypertension     Past Surgical History:  Procedure Laterality Date   NO PAST SURGERIES      Current Outpatient Medications  Medication Instructions   aspirin EC 81 mg, Oral, Daily   atorvastatin (LIPITOR) 80 mg, Oral, Daily   Blood Glucose Monitoring Suppl (ADVOCATE REDI-CODE) DEVI Does not apply, Reported on 04/22/2015   Calcium Carbonate-Vit D-Min (CALCIUM 1200 PO) 1,000 mg, Oral   chlorthalidone (HYGROTON) 25 mg, Oral, Daily   esomeprazole (NEXIUM) 40 mg, Oral, Daily before breakfast   glucose blood (ONETOUCH ULTRA) test strip Check blood sugars once daily   irbesartan (AVAPRO) 300 mg, Oral, Daily   lidocaine (XYLOCAINE) 5 % ointment 1 application , Topical, 2 times daily PRN   metFORMIN (GLUCOPHAGE) 1,000 mg, Oral, 2 times daily with meals   metoprolol tartrate (LOPRESSOR) 100 mg, Oral, 2 times daily   Multiple Vitamin (MULTIVITAMIN) tablet 1 tablet, Oral, Daily,     Multiple Vitamins-Minerals (EYE VITAMINS) CAPS 1  capsule, Daily   OneTouch Delica Lancets 99991111 MISC Check blood sugars once daily       Objective:   Physical Exam BP 126/74   Pulse (!) 59   Temp 98.4 F (36.9 C) (Oral)   Resp 16   Ht 5' 4"$  (1.626 m)   Wt 184 lb 4 oz (83.6 kg)   SpO2 97%   BMI 31.63 kg/m  General:   Well developed, NAD, BMI noted.  HEENT:  Normocephalic . Face symmetric, atraumatic Lungs:  CTA B Normal respiratory effort, no intercostal retractions, no accessory muscle use. Heart: RRR,  no murmur.  Abdomen:  Not distended, soft, non-tender. No rebound or rigidity.   Skin: Not pale. Not jaundice Lower extremities: no pretibial edema bilaterally  Neurologic:  alert & oriented X3.  Speech normal, gait appropriate for age and unassisted Psych--  Cognition and judgment appear intact.  Cooperative with normal attention span and concentration.  Behavior appropriate. No anxious or depressed appearing.     Assessment     Assessment: DM HTN (intolerant amlodipine 11-2015 and 2018) Hyperlipidemia Glaucoma dx 2014, cataracts NEURO: L amaurosix fugax 03-2015, CVA -- saw opthalmology, started ASA  (02-2015); echo 04-09-15: nl EF, grade 2 Diast disfx -- saw neuro: Probable cryptogenic  --Headaches: Sees  neurology   CV: Carotid artery disease:    [place] 04-2015: Progression of the right ICA now 40-59%. Stable left ICA 1-39%. [place]  02/2018, 07-30-2020 : carotids essentially normal GI: -GERD -Anemia: EGD colonoscopy 2017, Dr. Sherran Needs polyp removed + Covid 04-2019, 01-2021  PLAN: DM: Last A1c 6.2.  CBGs never more than 110.  Currently on metformin, check A1c. HTN: Last BMP very good.  Currently on chlorthalidone, Avapro, metoprolol.  Recheck a BMP-CBC. High cholesterol: On Lipitor 80 mg, Last LDL 79, close to her goal of 70.  Recheck FLP today, continue with high intensity statins. Fatigue: As described above, we are checking general labs today, recommend observation Diarrhea: As described above, no red  flags, recommend probiotics and observation.  Will need to see GI symptoms continue, also offered GI referral as she is due for a C-scope. RTC CPX 4 months

## 2022-06-08 NOTE — Patient Instructions (Addendum)
Check the  blood pressure regularly BP GOAL is between 110/65 and  135/85. If it is consistently higher or lower, let me know      GO TO THE LAB : Get the blood work     [place], [place] back for a physical exam in 4 months

## 2022-06-09 NOTE — Assessment & Plan Note (Deleted)
DM: Last A1c 6.2.  CBGs never more than 110.  Currently on metformin, check A1c. HTN: Last BMP very good.  Currently on chlorthalidone, Avapro, metoprolol.  Recheck a BMP-CBC. High cholesterol: On Lipitor 80 mg, Last LDL 79, close to her goal of 70.  Recheck FLP today, continue with high intensity statins. Fatigue: As described above, we are checking general labs today, recommend observation Diarrhea: As described above, no red flags, recommend probiotics and observation.  Will need to see GI symptoms continue, also offered GI referral as she is due for a C-scope. RTC CPX 4 months

## 2022-07-09 ENCOUNTER — Other Ambulatory Visit: Payer: Self-pay | Admitting: Internal Medicine

## 2022-07-12 ENCOUNTER — Other Ambulatory Visit: Payer: Self-pay | Admitting: Internal Medicine

## 2022-08-12 ENCOUNTER — Other Ambulatory Visit: Payer: Self-pay | Admitting: Internal Medicine

## 2022-10-06 ENCOUNTER — Other Ambulatory Visit: Payer: Self-pay | Admitting: Internal Medicine

## 2022-10-13 ENCOUNTER — Ambulatory Visit: Payer: Medicare Other | Admitting: Internal Medicine

## 2022-10-18 ENCOUNTER — Other Ambulatory Visit: Payer: Self-pay | Admitting: Internal Medicine

## 2022-10-20 ENCOUNTER — Other Ambulatory Visit: Payer: Self-pay | Admitting: Internal Medicine

## 2022-10-20 ENCOUNTER — Encounter: Payer: Self-pay | Admitting: Internal Medicine

## 2022-10-20 ENCOUNTER — Ambulatory Visit (INDEPENDENT_AMBULATORY_CARE_PROVIDER_SITE_OTHER): Payer: Medicare Other | Admitting: Internal Medicine

## 2022-10-20 VITALS — BP 126/70 | HR 56 | Temp 97.6°F | Resp 18 | Ht 64.0 in | Wt 182.1 lb

## 2022-10-20 DIAGNOSIS — E119 Type 2 diabetes mellitus without complications: Secondary | ICD-10-CM | POA: Diagnosis not present

## 2022-10-20 DIAGNOSIS — D649 Anemia, unspecified: Secondary | ICD-10-CM | POA: Diagnosis not present

## 2022-10-20 DIAGNOSIS — Z794 Long term (current) use of insulin: Secondary | ICD-10-CM

## 2022-10-20 DIAGNOSIS — I10 Essential (primary) hypertension: Secondary | ICD-10-CM | POA: Diagnosis not present

## 2022-10-20 DIAGNOSIS — R1013 Epigastric pain: Secondary | ICD-10-CM | POA: Diagnosis not present

## 2022-10-20 LAB — CBC WITH DIFFERENTIAL/PLATELET
Basophils Absolute: 39 cells/uL (ref 0–200)
Basophils Relative: 0.7 %
Lymphs Abs: 1635 cells/uL (ref 850–3900)
Neutro Abs: 3293 cells/uL (ref 1500–7800)

## 2022-10-20 MED ORDER — METOPROLOL TARTRATE 50 MG PO TABS: 100.0000 mg | ORAL_TABLET | Freq: Two times a day (BID) | ORAL | 1 refills | Status: DC

## 2022-10-20 MED ORDER — ESOMEPRAZOLE MAGNESIUM 40 MG PO CPDR: 40.0000 mg | DELAYED_RELEASE_CAPSULE | Freq: Two times a day (BID) | ORAL | 1 refills | Status: DC

## 2022-10-20 NOTE — Progress Notes (Unsigned)
Subjective:    Patient ID: Casey Bryan, female    DOB: Jan 04, 1946, 77 y.o.   MRN: [id number]  DOS:  10/20/2022 Type of visit - description: Routine checkup, here with her daughter  Chronic medical problems were addressed  She complains of epigastric pain for the last 4 weeks. It does not radiate, when it comes it lasts 1 or 2 days, increased by eating, slightly better when she adds Pepcid to her daily PPIs. When she is hurting she has heartburn but denies dysphagia or odynophagia.  Bowel movements are normal, stools normal in color.  No nausea or vomiting. Appetite is somewhat decreased due to pain. Denies any NSAIDs, no weight loss per our scales. Denies any exertional symptoms. Used to have more diarrhea before, that seems to be slightly better lately.   Wt Readings from Last 3 Encounters:  10/20/22 182 lb 2 oz (82.6 kg)  06/08/22 184 lb 4 oz (83.6 kg)  01/06/22 184 lb 2 oz (83.5 kg)     Review of Systems See above   Past Medical History:  Diagnosis Date   Amaurosis fugax 12/12/2015   Diabetes mellitus    GERD (gastroesophageal reflux disease)    Glaucoma 04-2012   Hyperlipemia    Hypertension     Past Surgical History:  Procedure Laterality Date   NO PAST SURGERIES      Current Outpatient Medications  Medication Instructions   aspirin EC 81 mg, Oral, Daily   atorvastatin (LIPITOR) 80 mg, Oral, Daily   Blood Glucose Monitoring Suppl (ADVOCATE REDI-CODE) DEVI Does not apply, Reported on 04/22/2015   Calcium Carbonate-Vit D-Min (CALCIUM 1200 PO) 1,000 mg, Oral   chlorthalidone (HYGROTON) 25 mg, Oral, Daily   esomeprazole (NEXIUM) 40 mg, Oral, 2 times daily before meals   glucose blood (ONETOUCH ULTRA) test strip USE TO CHECK BLOOD SUGARS ONCE DAILY   irbesartan (AVAPRO) 300 mg, Oral, Daily   Lancets (ONETOUCH DELICA PLUS LANCET33G) MISC USE TO CHECK BLOOD SUGARS ONCE DAILY   lidocaine (XYLOCAINE) 5 % ointment 1 application , Topical, 2 times daily PRN   metFORMIN  (GLUCOPHAGE) 1,000 mg, Oral, 2 times daily with meals   metoprolol tartrate (LOPRESSOR) 100 mg, Oral, 2 times daily   Multiple Vitamin (MULTIVITAMIN) tablet 1 tablet, Oral, Daily,     Multiple Vitamins-Minerals (EYE VITAMINS) CAPS 1 capsule, Daily       Objective:   Physical Exam BP 126/70   Pulse (!) 56   Temp 97.6 F (36.4 C) (Oral)   Resp 18   Ht 5\' 4"  (1.626 m)   Wt 182 lb 2 oz (82.6 kg)   SpO2 97%   BMI 31.26 kg/m  General:   Well developed, NAD, BMI noted.  HEENT:  Normocephalic . Face symmetric, atraumatic Lungs:  CTA B Normal respiratory effort, no intercostal retractions, no accessory muscle use. Heart: RRR,  no murmur.  Abdomen:  Not distended, soft, mild epigastric tenderness to palpation without mass or rebound.  No obvious organomegaly. Skin: Not pale. Not jaundice Lower extremities: no pretibial edema bilaterally  Neurologic:  alert & oriented X3.  Speech normal, gait appropriate for age and unassisted Psych--  Cognition and judgment appear intact.  Cooperative with normal attention span and concentration.  Behavior appropriate. No anxious or depressed appearing.     Assessment     Assessment: DM HTN (intolerant amlodipine 11-2015 and 2018) Hyperlipidemia Glaucoma dx 2014, cataracts NEURO: L amaurosix fugax 03-2015, CVA -- saw opthalmology, started ASA  (02-2015); echo  04-09-15: nl EF, grade 2 Diast disfx -- saw neuro: Probable cryptogenic  --Headaches: Sees  neurology   CV: + FH CV Dz Carotid artery disease:    [place] 04-2015: Progression of the right ICA now 40-59%. Stable left ICA 1-39%. [place] 02/2018, 07-30-2020 : carotids essentially normal GI: -GERD -Anemia: EGD colonoscopy 2017, Dr. Randa Evens,  Claudell Kyle polyp removed   PLAN: Epigastric pain: This is going on for about 2 months, as described above, DDx include PUD, GB, less likely mesenteric insufficiency versus others. Plan: Increase PPIs to twice daily. LFTs, CBC, anemia panel.  Also an  abdominal ultrasound. Refer to GI, Dr. Randa Evens retired, would like to Dr. Leone Payor or associate. DM: On metformin, last A1c satisfactory. HTN: BP is very good, continue chlorthalidone, avapro, metoprolol, last BMP okay. Preventive care reviewed:  -Td 2011: had a reaction -PNM 23: 2014; prevnar 2015 (had a reaction) -Vaccines I recommend: Shingrix, COVID-vaccine, RSV, flu shot every fall. - CCS: Cscope Dr Randa Evens ~ 2008, normal, cscope again 12-2011, + polyps, cscope 08-2015, no polyps.  Due for colonoscopy, referred to GI. -Female care: [facility] 04/2022 ([facility]) Has not seen gynecology recently, declined further eval. DEXA 12-2015: T score -1.5.   March 2022: T score  (-) 1.8.  Healthcare POA: See AVS RTC 4 months

## 2022-10-20 NOTE — Assessment & Plan Note (Signed)
DM: Last A1c 6.2.  CBGs never more than 110.  Currently on metformin, check A1c. HTN: Last BMP very good.  Currently on chlorthalidone, Avapro, metoprolol.  Recheck a BMP-CBC. High cholesterol: On Lipitor 80 mg, Last LDL 79, close to her goal of 70.  Recheck FLP today, continue with high intensity statins. Fatigue: As described above, we are checking general labs today, recommend observation Diarrhea: As described above, no red flags, recommend probiotics and observation.  Will need to see GI symptoms continue, also offered GI referral as she is due for a C-scope. RTC CPX 4 months 

## 2022-10-20 NOTE — Telephone Encounter (Signed)
Appt later today.

## 2022-10-20 NOTE — Patient Instructions (Addendum)
We are referring you to [facility] gastro[facility]ology. In addition to discuss your stomach pain you can talk to them about a colonoscopy  Increased Nexium to 1 tablet twice daily on an empty stomach  Vaccines I recommend: Flu shot this fall RSV vaccine Shingrix COVID-vaccine  Call anytime if you have severe stomach pain or change in the color of the stools   GO TO THE LAB : Get the blood work     GO TO THE FRONT DESK, PLEASE SCHEDULE YOUR APPOINTMENTS Come back for checkup in 4 months  STOP BY THE FIRST FLOOR: Arrange for a ultrasound of your abdomen

## 2022-10-21 LAB — HEPATIC FUNCTION PANEL
AG Ratio: 1.6 (calc) (ref 1.0–2.5)
ALT: 14 U/L (ref 6–29)
AST: 23 U/L (ref 10–35)
Albumin: 4.4 g/dL (ref 3.6–5.1)
Alkaline phosphatase (APISO): 43 U/L (ref 37–153)
Bilirubin, Direct: 0.1 mg/dL (ref 0.0–0.2)
Globulin: 2.7 g/dL (calc) (ref 1.9–3.7)
Indirect Bilirubin: 0.4 mg/dL (calc) (ref 0.2–1.2)
Total Bilirubin: 0.5 mg/dL (ref 0.2–1.2)
Total Protein: 7.1 g/dL (ref 6.1–8.1)

## 2022-10-21 LAB — HEMOGLOBIN A1C
Hgb A1c MFr Bld: 6.2 % of total Hgb — ABNORMAL HIGH (ref <5.7)
Mean Plasma Glucose: 131 mg/dL
eAG (mmol/L): 7.3 mmol/L

## 2022-10-21 LAB — CBC WITH DIFFERENTIAL/PLATELET
Absolute Monocytes: 493 cells/uL (ref 200–950)
Eosinophils Absolute: 140 cells/uL (ref 15–500)
Eosinophils Relative: 2.5 %
HCT: 32.3 % — ABNORMAL LOW (ref 35.0–45.0)
Hemoglobin: 10.8 g/dL — ABNORMAL LOW (ref 11.7–15.5)
MCH: 28.7 pg (ref 27.0–33.0)
MCHC: 33.4 g/dL (ref 32.0–36.0)
MCV: 85.9 fL (ref 80.0–100.0)
MPV: 10.4 fL (ref 7.5–12.5)
Monocytes Relative: 8.8 %
Neutrophils Relative %: 58.8 %
Platelets: 229 10*3/uL (ref 140–400)
RBC: 3.76 10*6/uL — ABNORMAL LOW (ref 3.80–5.10)
RDW: 13.2 % (ref 11.0–15.0)
Total Lymphocyte: 29.2 %
WBC: 5.6 10*3/uL (ref 3.8–10.8)

## 2022-10-21 LAB — IRON,TIBC AND FERRITIN PANEL
%SAT: 17 % (calc) (ref 16–45)
Ferritin: 31 ng/mL (ref 16–288)
Iron: 57 ug/dL (ref 45–160)
TIBC: 332 mcg/dL (calc) (ref 250–450)

## 2022-10-21 NOTE — Assessment & Plan Note (Signed)
Preventive care reviewed:  -Td 2011: had a reaction -PNM 23: 2014; prevnar 2015 (had a reaction) -Vaccines I recommend: Shingrix, COVID-vaccine, RSV, flu shot every fall. - CCS: Cscope Dr Randa Evens ~ 2008, normal, cscope again 12-2011, + polyps, cscope 08-2015, no polyps.  Due for colonoscopy, referred to GI. -Female care: [facility] 04/2022 ([facility]) Has not seen gynecology recently, declined further eval. DEXA 12-2015: T score -1.5.   March 2022: T score  (-) 1.8.  Healthcare POA: See AVS

## 2022-10-21 NOTE — Assessment & Plan Note (Signed)
Epigastric pain: This is going on for about 2 months, as described above, DDx include PUD, GB, less likely mesenteric insufficiency versus others. Plan: Increase PPIs to twice daily. LFTs, CBC, anemia panel.  Also an abdominal ultrasound. Refer to GI, Dr. Randa Evens retired, would like to Dr. Leone Payor or associate. DM: On metformin, last A1c satisfactory. HTN: BP is very good, continue chlorthalidone, avapro, metoprolol, last BMP okay. Preventive care reviewed RTC 4 months

## 2022-10-26 ENCOUNTER — Telehealth: Payer: Self-pay

## 2022-10-26 ENCOUNTER — Ambulatory Visit (HOSPITAL_BASED_OUTPATIENT_CLINIC_OR_DEPARTMENT_OTHER)
Admission: RE | Admit: 2022-10-26 | Discharge: 2022-10-26 | Disposition: A | Discharge status: Home / Self Care | Payer: Medicare Other | Source: Ambulatory Visit | Attending: Internal Medicine | Admitting: Internal Medicine

## 2022-10-26 DIAGNOSIS — R1013 Epigastric pain: Secondary | ICD-10-CM | POA: Diagnosis not present

## 2022-10-26 DIAGNOSIS — N1330 Unspecified hydronephrosis: Secondary | ICD-10-CM

## 2022-10-26 NOTE — Telephone Encounter (Signed)
IMPRESSION: There is a 4.7 cm right renal sinus cyst versus moderate right hydronephrosis. Consider further assessment with CT abdomen and pelvis for further characterization.   These results will be called to the ordering clinician or representative by the Radiologist Assistant, and communication documented in the PACS or [employer].  Received call Casey Bryan at [facility] imaging regarding above report. PCP verbally made aware by [facility].

## 2022-10-26 NOTE — Telephone Encounter (Signed)
Mychart message sent to Pt. CT ordered STAT.

## 2022-10-26 NOTE — Telephone Encounter (Signed)
Last read by Thayer Ohm at  4:53 PM on 10/26/2022.

## 2022-10-26 NOTE — Telephone Encounter (Signed)
Spoke w/ [facility]- they received increased dose of esomeprazole on 7/3- however Pt filled prescription for 90 day supply on 10/07/22 and should have enough to take bid until she can pick up next on refill date of 11/10/22. Will inform Pt.

## 2022-10-26 NOTE — Telephone Encounter (Signed)
Proceed with a CT abdomen and pelvis. Dx Abnormal renal ultrasound. Let the patient know

## 2022-10-26 NOTE — Addendum Note (Signed)
Addended byConrad [place] D on: 10/26/2022 04:07 PM   Modules accepted: Orders

## 2022-10-26 NOTE — Telephone Encounter (Signed)
Informed today by Pt that pharmacy has not filled nexium 40mg  bid prescription that was sent on 10/20/2022. Tried initiating PA via Covermymeds; KEY: [id number]. Informed that it was already been approved (04/19/22 to 04/19/23). Tried Environmental education officer at [place], they are on lunch until 2pm. Will try again later.

## 2022-10-27 ENCOUNTER — Telehealth: Payer: Self-pay | Admitting: Internal Medicine

## 2022-10-27 ENCOUNTER — Ambulatory Visit (HOSPITAL_BASED_OUTPATIENT_CLINIC_OR_DEPARTMENT_OTHER)
Admission: RE | Admit: 2022-10-27 | Discharge: 2022-10-27 | Disposition: A | Discharge status: Home / Self Care | Payer: Medicare Other | Source: Ambulatory Visit | Attending: Internal Medicine | Admitting: Internal Medicine

## 2022-10-27 DIAGNOSIS — N1330 Unspecified hydronephrosis: Secondary | ICD-10-CM | POA: Diagnosis not present

## 2022-10-27 NOTE — Telephone Encounter (Signed)
Arrange referral to urology.  Dx R hydronephrosis. ==== See summary of results below.  She has atheromatous plaques at the celiac trunk, GI claudication is in the DDx, will defer to GI for further evaluation for that finding. Results discussed with the patient's daughter, to let me know if severe symptoms. -Mild to moderate right hydronephrosis terminating at the UPJ, suggesting chronic UPJ narrowing. No urinary tract calculi identified. - Atheromatous plaque is present dorsally at the origins of the celiac trunk and SMA. -Aortic sclerosis

## 2022-10-27 NOTE — Telephone Encounter (Signed)
Urology referral placed

## 2022-10-27 NOTE — Telephone Encounter (Signed)
CT scheduled today at 3pm.

## 2022-10-28 NOTE — Telephone Encounter (Signed)
Appt scheduled w/ urology on 11/04/22.

## 2022-11-04 ENCOUNTER — Encounter: Payer: Self-pay | Admitting: Urology

## 2022-11-04 ENCOUNTER — Ambulatory Visit (INDEPENDENT_AMBULATORY_CARE_PROVIDER_SITE_OTHER): Payer: Medicare Other | Admitting: Urology

## 2022-11-04 VITALS — BP 163/74 | HR 61 | Ht 64.0 in | Wt 182.0 lb

## 2022-11-04 DIAGNOSIS — N1330 Unspecified hydronephrosis: Secondary | ICD-10-CM | POA: Insufficient documentation

## 2022-11-04 NOTE — Progress Notes (Signed)
Assessment: 1. Hydronephrosis of right kidney, likely chronic UPJ obstruction     Plan: I personally reviewed the patient's chart including provider notes, lab and imaging results. I personally reviewed the ultrasound from 10/26/2022 and the CT from 10/27/2022 with results as noted below. The findings are consistent with a chronic UPJ obstruction.  There appears to be healthy parenchyma of the right kidney. I discussed the diagnosis and management of a UPJ obstruction with the patient and her daughter today. She is currently asymptomatic with normal renal function.  I do not feel that further evaluation or any intervention is necessary for the chronic UPJ obstruction at the present time. Recommend following renal function and repeat evaluation if there is any worsening or if she develops symptoms. They are in agreement with this plan. Return to office as needed.  Chief Complaint:  Chief Complaint  Patient presents with   Hydronephrosis    History of Present Illness:  Casey Bryan is a 77 y.o. female who is seen in consultation from Wanda Plump, MD for evaluation of right hydronephrosis.  She underwent imaging studies for evaluation of intermittent epigastric pain x 6 months. Renal ultrasound from 10/26/2022 done for evaluation of epigastric pain showed moderate right hydronephrosis. CT abdomen and pelvis without contrast on 10/27/2022 showed a prominent right renal pelvis and a large infundibulum compatible with chronic UPJ obstruction. No history of flank pain.  No history of kidney stone nodes. She reports a UTI approximately 1 year ago.  No urinary symptoms at the present time.  No dysuria or gross hematuria. She does have right sided low back pain with sciatica. Cr from 2/24:  0.91  Past Medical History:  Past Medical History:  Diagnosis Date   Amaurosis fugax 12/12/2015   Diabetes mellitus    GERD (gastroesophageal reflux disease)    Glaucoma 04-2012   Hyperlipemia     Hypertension     Past Surgical History:  Past Surgical History:  Procedure Laterality Date   NO PAST SURGERIES      Allergies:  Allergies  Allergen Reactions   Prevnar [Pneumococcal 13-Val Conj Vacc] Nausea And Vomiting and Other (See Comments)    Fever of 102.    Clarithromycin [Clarithromycin]     Contradictions w/Vytorin   Penicillins    Tetanus-Diphtheria Toxoids Td     REACTION: see OV note  02-11-10    Family History:  Family History  Problem Relation Age of Onset   Diabetes Mother        uncles, mother , father    Coronary artery disease Other        F age 69, B age 73, uncle age 38 : MI   Heart attack Brother 68   Breast cancer Sister    Multiple myeloma Sister    Colon cancer Neg Hx     Social History:  Social History   Tobacco Use   Smoking status: Never   Smokeless tobacco: Never  Substance Use Topics   Alcohol use: Yes    Comment: socially   Drug use: No    Review of symptoms:  Constitutional:  Negative for unexplained weight loss, night sweats, fever, chills ENT:  Negative for nose bleeds, sinus pain, painful swallowing CV:  Negative for chest pain, shortness of breath, exercise intolerance, palpitations, loss of consciousness Resp:  Negative for cough, wheezing, shortness of breath GI:  Negative for nausea, vomiting, diarrhea, bloody stools GU:  Positives noted in HPI; otherwise negative for gross hematuria, dysuria,  urinary incontinence Neuro:  Negative for seizures, poor balance, limb weakness, slurred speech Psych:  Negative for lack of energy, depression, anxiety Endocrine:  Negative for polydipsia, polyuria, symptoms of hypoglycemia (dizziness, hunger, sweating) Hematologic:  Negative for anemia, purpura, petechia, prolonged or excessive bleeding, use of anticoagulants  Allergic:  Negative for difficulty breathing or choking as a result of exposure to anything; no shellfish allergy; no allergic response (rash/itch) to materials,  foods  Physical exam: BP (!) 163/74   Pulse 61   Ht 5\' 4"  (1.626 m)   Wt 182 lb (82.6 kg)   BMI 31.24 kg/m  GENERAL APPEARANCE:  Well appearing, well developed, well nourished, NAD HEENT: Atraumatic, Normocephalic, oropharynx clear. NECK: Supple without lymphadenopathy or thyromegaly. LUNGS: Clear to auscultation bilaterally. HEART: Regular Rate and Rhythm without murmurs, gallops, or rubs. ABDOMEN: Soft, non-tender, No Masses. EXTREMITIES: Moves all extremities well.  Without clubbing, cyanosis, or edema. NEUROLOGIC:  Alert and oriented x 3, normal gait, CN II-XII grossly intact.  MENTAL STATUS:  Appropriate. BACK:  Non-tender to palpation.  No CVAT SKIN:  Warm, dry and intact.    Results: U/A:  6-10 WBC, 0-2 RBC

## 2022-11-05 LAB — URINALYSIS, ROUTINE W REFLEX MICROSCOPIC
Bilirubin, UA: NEGATIVE
Glucose, UA: NEGATIVE
Ketones, UA: NEGATIVE
Nitrite, UA: NEGATIVE
RBC, UA: NEGATIVE
Specific Gravity, UA: 1.015 (ref 1.005–1.030)
Urobilinogen, Ur: 1 mg/dL (ref 0.2–1.0)
pH, UA: 7 (ref 5.0–7.5)

## 2022-11-05 LAB — MICROSCOPIC EXAMINATION

## 2022-11-11 ENCOUNTER — Telehealth: Payer: Self-pay | Admitting: Internal Medicine

## 2022-11-11 NOTE — Telephone Encounter (Signed)
Casey Bryan [facility]) called to verify if pt is on insulin for diabetic supplies. All CMAs were unavailable at the time and advised a note would be routed back to look into this and give her a call back.

## 2022-11-12 NOTE — Telephone Encounter (Signed)
Form received, completed and faxed back to [facility] at [phone]. Form sent for scanning.

## 2023-01-07 ENCOUNTER — Other Ambulatory Visit: Payer: Self-pay | Admitting: Internal Medicine

## 2023-02-21 ENCOUNTER — Ambulatory Visit (INDEPENDENT_AMBULATORY_CARE_PROVIDER_SITE_OTHER): Payer: Medicare Other | Admitting: Internal Medicine

## 2023-02-21 ENCOUNTER — Encounter: Payer: Self-pay | Admitting: Internal Medicine

## 2023-02-21 VITALS — BP 130/82 | HR 55 | Temp 97.8°F | Resp 16 | Ht 64.0 in | Wt 180.4 lb

## 2023-02-21 DIAGNOSIS — N1330 Unspecified hydronephrosis: Secondary | ICD-10-CM | POA: Diagnosis not present

## 2023-02-21 DIAGNOSIS — I10 Essential (primary) hypertension: Secondary | ICD-10-CM | POA: Diagnosis not present

## 2023-02-21 DIAGNOSIS — D509 Iron deficiency anemia, unspecified: Secondary | ICD-10-CM | POA: Diagnosis not present

## 2023-02-21 DIAGNOSIS — E119 Type 2 diabetes mellitus without complications: Secondary | ICD-10-CM | POA: Diagnosis not present

## 2023-02-21 DIAGNOSIS — Z794 Long term (current) use of insulin: Secondary | ICD-10-CM

## 2023-02-21 DIAGNOSIS — Z23 Encounter for immunization: Secondary | ICD-10-CM | POA: Diagnosis not present

## 2023-02-21 NOTE — Progress Notes (Unsigned)
Subjective:    Patient ID: Casey Bryan, female    DOB: 1946/04/12, 77 y.o.   MRN: [id number]  DOS:  02/21/2023 Type of visit - description: f/u, here with her daughter.  At LOV complain of epigastric pain.  That is essentially resolved. Denies also nausea vomiting.  Bowel movements normal.  No blood in the stools.  Incidentally was found to have a R hydronephrosis: Chart is reviewed and summarized assessment and plan.  Review of Systems See above   Past Medical History:  Diagnosis Date   Amaurosis fugax 12/12/2015   Diabetes mellitus    GERD (gastroesophageal reflux disease)    Glaucoma 04-2012   Hyperlipemia    Hypertension     Past Surgical History:  Procedure Laterality Date   NO PAST SURGERIES      Current Outpatient Medications  Medication Instructions   aspirin EC 81 mg, Oral, Daily   atorvastatin (LIPITOR) 80 mg, Oral, Daily   Blood Glucose Monitoring Suppl (ADVOCATE REDI-CODE) DEVI Does not apply, Reported on 04/22/2015   Calcium Carbonate-Vit D-Min (CALCIUM 1200 PO) 1,000 mg, Oral   chlorthalidone (HYGROTON) 25 mg, Oral, Daily   esomeprazole (NEXIUM) 40 mg, Oral, 2 times daily before meals   glucose blood (ONETOUCH ULTRA) test strip USE TO CHECK BLOOD SUGARS ONCE DAILY   irbesartan (AVAPRO) 300 mg, Oral, Daily   Lancets (ONETOUCH DELICA PLUS LANCET33G) MISC USE TO CHECK BLOOD SUGARS ONCE DAILY   lidocaine (XYLOCAINE) 5 % ointment 1 application , Topical, 2 times daily PRN   metFORMIN (GLUCOPHAGE) 1,000 mg, Oral, 2 times daily with meals   metoprolol tartrate (LOPRESSOR) 100 mg, Oral, 2 times daily   Multiple Vitamin (MULTIVITAMIN) tablet 1 tablet, Oral, Daily,     Multiple Vitamins-Minerals (EYE VITAMINS) CAPS 1 capsule, Daily       Objective:   Physical Exam BP 130/82   Pulse (!) 55   Temp 97.8 F (36.6 C) (Oral)   Resp 16   Ht 5\' 4"  (1.626 m)   Wt 180 lb 6 oz (81.8 kg)   SpO2 96%   BMI 30.96 kg/m  General:   Well developed, NAD, BMI  noted. HEENT:  Normocephalic . Face symmetric, atraumatic Lungs:  CTA B Normal respiratory effort, no intercostal retractions, no accessory muscle use. Heart: RRR,  no murmur.  Lower extremities: no pretibial edema bilaterally  Skin: Not pale. Not jaundice Neurologic:  alert & oriented X3.  Speech normal, gait appropriate for age and unassisted Psych--  Cognition and judgment appear intact.  Cooperative with normal attention span and concentration.  Behavior appropriate. No anxious or depressed appearing.      Assessment     Assessment: DM HTN (intolerant amlodipine 11-2015 and 2018) Hyperlipidemia Glaucoma dx 2014, cataracts NEURO: L amaurosix fugax 03-2015, CVA -- saw opthalmology, started ASA  (02-2015); echo 04-09-15: nl EF, grade 2 Diast disfx -- saw neuro: Probable cryptogenic  --Headaches: Sees  neurology   CV: + FH CV Dz Carotid artery disease:    [place] 04-2015: Progression of the right ICA now 40-59%. Stable left ICA 1-39%. [place] 02/2018, 07-30-2020 : carotids essentially normal GI: -GERD -Anemia: EGD colonoscopy 2017, Dr. Randa Evens,  Claudell Kyle polyp removed   PLAN: DM: On metformin, check A1c and micro.  Ambulatory CBGs typically in the 90s. HTN: On chlorthalidone, Avapro, metoprolol.  No recent ambulatory BPs, BP today is very good, check a BMP. R hydronephrosis: Since the last visit had a ultrasound of the abdomen, 4.7 cm R renal  sinus cyst with moderate right hydronephrosis. Follow-up CT confirmed hydronephrosis. Saw urology, changes felt to be chronic due to UPJ obstruction, kidney looks healthy.  Normal renal function.  No further workup recommended Epigastric pain: See visit from July, symptoms completely resolved. Mild anemia: Per chart review, hemoglobin is slightly decreased, recheck hemoglobin, iron and ferritin. Preventive care: Flu shot today, recommend COVID and RSV vaccines. Pros> cons RTC 4 to 5 months 7-3 Epigastric pain: This is going on for about 2  months, as described above, DDx include PUD, GB, less likely mesenteric insufficiency versus others. Plan: Increase PPIs to twice daily. LFTs, CBC, anemia panel.  Also an abdominal ultrasound. Refer to GI, Dr. Randa Evens retired, would like to Dr. Leone Payor or associate. DM: On metformin, last A1c satisfactory. HTN: BP is very good, continue chlorthalidone, avapro, metoprolol, last BMP okay. Preventive care reviewed:  -Td 2011: had a reaction -PNM 23: 2014; prevnar 2015 (had a reaction) -Vaccines I recommend: Shingrix, COVID-vaccine, RSV, flu shot every fall. - CCS: Cscope Dr Randa Evens ~ 2008, normal, cscope again 12-2011, + polyps, cscope 08-2015, no polyps.  Due for colonoscopy, referred to GI. -Female care: [facility] 04/2022 ([facility]) Has not seen gynecology recently, declined further eval. DEXA 12-2015: T score -1.5.   March 2022: T score  (-) 1.8.  Healthcare POA: See AVS RTC 4 months

## 2023-02-21 NOTE — Patient Instructions (Addendum)
Vaccines I recommend: RSV vaccine COVID-vaccine     GO TO THE LAB : Get the blood work     Next visit with me in 4 to 5 months for a checkup    Please schedule it at the front desk    Per our records you are due for your diabetic eye exam. Please contact your eye doctor to schedule an appointment. Please have them send copies of your office visit notes to [place]. Our fax number is [phone]. If you need a referral to an eye doctor please let us know.

## 2023-02-22 LAB — MICROALBUMIN / CREATININE URINE RATIO
Creatinine,U: 216.6 mg/dL
Microalb Creat Ratio: 4.6 mg/g (ref 0.0–30.0)
Microalb, Ur: 9.9 mg/dL — ABNORMAL HIGH (ref 0.0–1.9)

## 2023-02-22 LAB — BASIC METABOLIC PANEL
BUN: 19 mg/dL (ref 6–23)
CO2: 29 meq/L (ref 19–32)
Calcium: 8.8 mg/dL (ref 8.4–10.5)
Chloride: 98 meq/L (ref 96–112)
Creatinine, Ser: 1.05 mg/dL (ref 0.40–1.20)
GFR: 51.64 mL/min — ABNORMAL LOW (ref >60.00)
Glucose, Bld: 76 mg/dL (ref 70–99)
Potassium: 3.7 meq/L (ref 3.5–5.1)
Sodium: 137 meq/L (ref 135–145)

## 2023-02-22 LAB — HEMOGLOBIN A1C: Hgb A1c MFr Bld: 6.1 % (ref 4.6–6.5)

## 2023-02-22 LAB — HEMOGLOBIN: Hemoglobin: 10.6 g/dL — ABNORMAL LOW (ref 12.0–15.0)

## 2023-02-22 LAB — IRON: Iron: 48 ug/dL (ref 42–145)

## 2023-02-22 LAB — FERRITIN: Ferritin: 15.5 ng/mL (ref 10.0–291.0)

## 2023-02-22 NOTE — Assessment & Plan Note (Signed)
DM: On metformin, check A1c and micro.  Ambulatory CBGs typically in the 90s. HTN: On chlorthalidone, Avapro, metoprolol.  No recent ambulatory BPs, BP today is very good, check a BMP. R hydronephrosis: Since the last visit had a ultrasound of the abdomen, 4.7 cm R renal sinus cyst with moderate right hydronephrosis. Follow-up CT confirmed hydronephrosis. Saw urology, changes felt to be chronic due to UPJ obstruction, kidney looks healthy.  Normal renal function.  No further w/u. Epigastric pain: See visit from July, symptoms completely resolved. Mild anemia: Per chart review, hemoglobin is slightly decreased, recheck hemoglobin, iron and ferritin. Preventive care: Flu shot today, recommend COVID and RSV vaccines. Pros> cons RTC 4 to 5 months

## 2023-03-20 ENCOUNTER — Other Ambulatory Visit: Payer: Self-pay | Admitting: Internal Medicine

## 2023-03-25 ENCOUNTER — Other Ambulatory Visit: Payer: Self-pay | Admitting: Internal Medicine

## 2023-03-25 DIAGNOSIS — Z1231 Encounter for screening mammogram for malignant neoplasm of breast: Secondary | ICD-10-CM

## 2023-05-10 ENCOUNTER — Ambulatory Visit
Admission: RE | Admit: 2023-05-10 | Discharge: 2023-05-10 | Disposition: A | Discharge status: Home / Self Care | Payer: Medicare Other | Source: Ambulatory Visit | Attending: Internal Medicine | Admitting: Internal Medicine

## 2023-05-10 DIAGNOSIS — Z1231 Encounter for screening mammogram for malignant neoplasm of breast: Secondary | ICD-10-CM | POA: Diagnosis not present

## 2023-05-16 ENCOUNTER — Other Ambulatory Visit: Payer: Self-pay | Admitting: Internal Medicine

## 2023-05-24 ENCOUNTER — Other Ambulatory Visit: Payer: Self-pay | Admitting: Internal Medicine

## 2023-06-21 ENCOUNTER — Ambulatory Visit: Payer: Medicare Other | Admitting: Internal Medicine

## 2023-06-24 ENCOUNTER — Ambulatory Visit (INDEPENDENT_AMBULATORY_CARE_PROVIDER_SITE_OTHER): Admitting: Internal Medicine

## 2023-06-24 ENCOUNTER — Encounter: Payer: Self-pay | Admitting: Internal Medicine

## 2023-06-24 VITALS — BP 124/70 | HR 52 | Temp 98.0°F | Resp 16 | Ht 64.0 in | Wt 173.5 lb

## 2023-06-24 DIAGNOSIS — I10 Essential (primary) hypertension: Secondary | ICD-10-CM

## 2023-06-24 DIAGNOSIS — Z794 Long term (current) use of insulin: Secondary | ICD-10-CM

## 2023-06-24 DIAGNOSIS — E119 Type 2 diabetes mellitus without complications: Secondary | ICD-10-CM | POA: Diagnosis not present

## 2023-06-24 DIAGNOSIS — R634 Abnormal weight loss: Secondary | ICD-10-CM

## 2023-06-24 DIAGNOSIS — Z7984 Long term (current) use of oral hypoglycemic drugs: Secondary | ICD-10-CM | POA: Diagnosis not present

## 2023-06-24 DIAGNOSIS — E785 Hyperlipidemia, unspecified: Secondary | ICD-10-CM

## 2023-06-24 DIAGNOSIS — F4321 Adjustment disorder with depressed mood: Secondary | ICD-10-CM | POA: Diagnosis not present

## 2023-06-24 MED ORDER — METOPROLOL TARTRATE 50 MG PO TABS: 100.0000 mg | ORAL_TABLET | Freq: Two times a day (BID) | ORAL | 1 refills | Status: DC

## 2023-06-24 MED ORDER — IRBESARTAN 300 MG PO TABS: 300.0000 mg | ORAL_TABLET | Freq: Every day | ORAL | 1 refills | Status: DC

## 2023-06-24 MED ORDER — ESOMEPRAZOLE MAGNESIUM 40 MG PO CPDR: 40.0000 mg | DELAYED_RELEASE_CAPSULE | Freq: Two times a day (BID) | ORAL | 1 refills | Status: DC

## 2023-06-24 MED ORDER — METFORMIN HCL 1000 MG PO TABS: 1000.0000 mg | ORAL_TABLET | Freq: Two times a day (BID) | ORAL | 1 refills | Status: DC

## 2023-06-24 MED ORDER — CHLORTHALIDONE 25 MG PO TABS: 25.0000 mg | ORAL_TABLET | Freq: Every day | ORAL | 1 refills | Status: DC

## 2023-06-24 MED ORDER — ATORVASTATIN CALCIUM 80 MG PO TABS: 80.0000 mg | ORAL_TABLET | Freq: Every day | ORAL | 1 refills | Status: DC

## 2023-06-24 NOTE — Patient Instructions (Addendum)
 Diabetes: You can check your sugars  - early in AM fasting  ( blood sugar goal 70-130) - 2 hours after a meal (blood sugar goal less than 180) - bedtime (goal 90-150)    Check the  blood pressure regularly Blood pressure goal:  between 110/65 and  135/85. If it is consistently higher or lower, let me know     GO TO THE LAB : Get the blood work     Please go to the front desk: Arrange for a a physical exam in 4 months    Per our records you are due for your diabetic eye exam. Please contact your eye doctor to schedule an appointment. Please have them send copies of your office visit notes to [place]. Our fax number is [phone]. If you need a referral to an eye doctor please let us know.

## 2023-06-24 NOTE — Progress Notes (Signed)
 Subjective:    Patient ID: Casey Bryan, female    DOB: 02-Mar-1946, 78 y.o.   MRN: [id number]  DOS:  06/24/2023 Type of visit - description: Follow-up  Routine follow-up. Since LOV, she lost her husband who was also my patient. Obviously sad and affected by her loss. We talk about her chronic medical problems. I noted weight loss, she admits to decreased appetite but denies fever or chills; no nausea or vomiting.  No blood in the stools.   Wt Readings from Last 3 Encounters:  06/24/23 173 lb 8 oz (78.7 kg)  02/21/23 180 lb 6 oz (81.8 kg)  11/04/22 182 lb (82.6 kg)    Review of Systems See above   Past Medical History:  Diagnosis Date   Amaurosis fugax 12/12/2015   Diabetes mellitus    GERD (gastroesophageal reflux disease)    Glaucoma 04-2012   Hyperlipemia    Hypertension     Past Surgical History:  Procedure Laterality Date   NO PAST SURGERIES      Current Outpatient Medications  Medication Instructions   aspirin EC 81 mg, Daily   atorvastatin (LIPITOR) 80 mg, Oral, Daily   Blood Glucose Monitoring Suppl (ADVOCATE REDI-CODE) DEVI by Does not apply route. Reported on 04/22/2015   Calcium Carbonate-Vit D-Min (CALCIUM 1200 PO) 1,000 mg   chlorthalidone (HYGROTON) 25 mg, Oral, Daily   esomeprazole (NEXIUM) 40 mg, Oral, 2 times daily before meals   glucose blood (ONETOUCH ULTRA) test strip USE TO CHECK BLOOD SUGARS ONCE DAILY   irbesartan (AVAPRO) 300 mg, Oral, Daily   Lancets (ONETOUCH DELICA PLUS LANCET33G) MISC USE TO CHECK BLOOD SUGARS ONCE DAILY   lidocaine (XYLOCAINE) 5 % ointment 1 application , 2 times daily PRN   metFORMIN (GLUCOPHAGE) 1,000 mg, Oral, 2 times daily with meals   metoprolol tartrate (LOPRESSOR) 100 mg, Oral, 2 times daily   Multiple Vitamin (MULTIVITAMIN) tablet 1 tablet, Daily   Multiple Vitamins-Minerals (EYE VITAMINS) CAPS 1 capsule, Daily       Objective:   Physical Exam BP 124/70   Pulse (!) 52   Temp 98 F (36.7 C) (Oral)   Resp 16    Ht 5\' 4"  (1.626 m)   Wt 173 lb 8 oz (78.7 kg)   SpO2 95%   BMI 29.78 kg/m  General:   Well developed, NAD, BMI noted. HEENT:  Normocephalic . Face symmetric, atraumatic Lungs:  CTA B Normal respiratory effort, no intercostal retractions, no accessory muscle use. Heart: RRR,  no murmur.  Diabetic foot exam: No pitting edema, good pedal pulses, pinprick examination normal Skin: Not pale. Not jaundice Neurologic:  alert & oriented X3.  Speech normal, gait appropriate for age and unassisted Psych--  Cognition and judgment appear intact.  Cooperative with normal attention span and concentration.  Behavior appropriate. Tearful when we talk about her husband     Assessment    Assessment: DM HTN (intolerant amlodipine 11-2015 and 2018) Hyperlipidemia Glaucoma dx 2014, cataracts NEURO: L amaurosix fugax 03-2015, CVA -- saw opthalmology, started ASA  (02-2015); echo 04-09-15: nl EF, grade 2 Diast disfx -- saw neuro: Probable cryptogenic  --Headaches: Sees  neurology   CV: + FH CV Dz Carotid artery disease:    [place] 04-2015: Progression of the right ICA now 40-59%. Stable left ICA 1-39%. [place] 02/2018, 07-30-2020 : carotids essentially normal GI: -GERD -Anemia: EGD colonoscopy 2017, Dr. Randa Evens,  Claudell Kyle polyp removed   PLAN: Grieving: Lost her husband of 50 years in early 2025.  Grieving, tearful. Fortunately she has a large family, and she has constant company. Lives by herself but a niece is w/ her for now  (as off 06/2023) Condolences provided, encouraged to let me know if she feels her grieving is too severe or not evolving in a positive way. DM: On metformin, feet exam negative, ambulatory CBGs in the 110s, check labs. HTN: On chlorthalidone, Avapro, metoprolol.  Checking a BMP. High cholesterol: On Lipitor 80 mg, check FLP. History of anemia: Check a CBC Weight loss: Likely related to grieving, check a TSH for completeness. RTC 4 months CPX

## 2023-06-25 LAB — BASIC METABOLIC PANEL
BUN: 15 mg/dL (ref 7–25)
CO2: 29 mmol/L (ref 20–32)
Calcium: 9.5 mg/dL (ref 8.6–10.4)
Chloride: 94 mmol/L — ABNORMAL LOW (ref 98–110)
Creat: 0.93 mg/dL (ref 0.60–1.00)
Glucose, Bld: 94 mg/dL (ref 65–99)
Potassium: 3.8 mmol/L (ref 3.5–5.3)
Sodium: 133 mmol/L — ABNORMAL LOW (ref 135–146)

## 2023-06-25 LAB — CBC WITH DIFFERENTIAL/PLATELET
Absolute Lymphocytes: 1605 {cells}/uL (ref 850–3900)
Absolute Monocytes: 340 {cells}/uL (ref 200–950)
Basophils Absolute: 18 {cells}/uL (ref 0–200)
Basophils Relative: 0.4 %
Eosinophils Absolute: 69 {cells}/uL (ref 15–500)
Eosinophils Relative: 1.5 %
HCT: 35.2 % (ref 35.0–45.0)
Hemoglobin: 11.5 g/dL — ABNORMAL LOW (ref 11.7–15.5)
MCH: 29 pg (ref 27.0–33.0)
MCHC: 32.7 g/dL (ref 32.0–36.0)
MCV: 88.9 fL (ref 80.0–100.0)
MPV: 10.4 fL (ref 7.5–12.5)
Monocytes Relative: 7.4 %
Neutro Abs: 2567 {cells}/uL (ref 1500–7800)
Neutrophils Relative %: 55.8 %
Platelets: 235 10*3/uL (ref 140–400)
RBC: 3.96 10*6/uL (ref 3.80–5.10)
RDW: 12.7 % (ref 11.0–15.0)
Total Lymphocyte: 34.9 %
WBC: 4.6 10*3/uL (ref 3.8–10.8)

## 2023-06-25 LAB — HEMOGLOBIN A1C
Hgb A1c MFr Bld: 6 %{Hb} — ABNORMAL HIGH (ref <5.7)
Mean Plasma Glucose: 126 mg/dL
eAG (mmol/L): 7 mmol/L

## 2023-06-25 LAB — LIPID PANEL
Cholesterol: 129 mg/dL (ref <200)
HDL: 53 mg/dL (ref >=50)
LDL Cholesterol (Calc): 61 mg/dL
Non-HDL Cholesterol (Calc): 76 mg/dL (ref <130)
Total CHOL/HDL Ratio: 2.4 (calc) (ref <5.0)
Triglycerides: 73 mg/dL (ref <150)

## 2023-06-25 LAB — TSH: TSH: 0.85 m[IU]/L (ref 0.40–4.50)

## 2023-06-25 NOTE — Assessment & Plan Note (Signed)
 Grieving: Lost her husband of 50 years in early 2025.  Grieving, tearful. Fortunately she has a large family, and she has constant company. Lives by herself but a niece is w/ her for now  (as off 06/2023) Condolences provided, encouraged to let me know if she feels her grieving is too severe or not evolving in a positive way. DM: On metformin, feet exam negative, ambulatory CBGs in the 110s, check labs. HTN: On chlorthalidone, Avapro, metoprolol.  Checking a BMP. High cholesterol: On Lipitor 80 mg, check FLP. History of anemia: Check a CBC Weight loss: Likely related to grieving, check a TSH for completeness. RTC 4 months CPX

## 2023-06-26 ENCOUNTER — Encounter: Payer: Self-pay | Admitting: Internal Medicine

## 2023-08-11 ENCOUNTER — Encounter: Payer: Self-pay | Admitting: Internal Medicine

## 2023-08-12 DIAGNOSIS — R5383 Other fatigue: Secondary | ICD-10-CM | POA: Diagnosis not present

## 2023-08-12 DIAGNOSIS — R350 Frequency of micturition: Secondary | ICD-10-CM | POA: Diagnosis not present

## 2023-08-12 DIAGNOSIS — N3000 Acute cystitis without hematuria: Secondary | ICD-10-CM | POA: Diagnosis not present

## 2023-08-15 ENCOUNTER — Encounter: Payer: Self-pay | Admitting: Family Medicine

## 2023-08-15 ENCOUNTER — Ambulatory Visit: Admitting: Internal Medicine

## 2023-08-15 ENCOUNTER — Emergency Department (HOSPITAL_BASED_OUTPATIENT_CLINIC_OR_DEPARTMENT_OTHER)

## 2023-08-15 ENCOUNTER — Telehealth: Payer: Self-pay | Admitting: *Deleted

## 2023-08-15 ENCOUNTER — Other Ambulatory Visit: Payer: Self-pay

## 2023-08-15 ENCOUNTER — Telehealth: Payer: Self-pay

## 2023-08-15 ENCOUNTER — Inpatient Hospital Stay (HOSPITAL_BASED_OUTPATIENT_CLINIC_OR_DEPARTMENT_OTHER)
Admission: EM | Admit: 2023-08-15 | Discharge: 2023-08-18 | DRG: 643 | Disposition: A | Discharge status: Home / Self Care | Attending: Internal Medicine | Admitting: Internal Medicine

## 2023-08-15 ENCOUNTER — Ambulatory Visit: Payer: Self-pay

## 2023-08-15 ENCOUNTER — Encounter (HOSPITAL_BASED_OUTPATIENT_CLINIC_OR_DEPARTMENT_OTHER): Payer: Self-pay

## 2023-08-15 ENCOUNTER — Ambulatory Visit (INDEPENDENT_AMBULATORY_CARE_PROVIDER_SITE_OTHER): Admitting: Family Medicine

## 2023-08-15 DIAGNOSIS — Z881 Allergy status to other antibiotic agents status: Secondary | ICD-10-CM

## 2023-08-15 DIAGNOSIS — Z833 Family history of diabetes mellitus: Secondary | ICD-10-CM

## 2023-08-15 DIAGNOSIS — Z887 Allergy status to serum and vaccine status: Secondary | ICD-10-CM

## 2023-08-15 DIAGNOSIS — E860 Dehydration: Secondary | ICD-10-CM | POA: Diagnosis present

## 2023-08-15 DIAGNOSIS — E222 Syndrome of inappropriate secretion of antidiuretic hormone: Secondary | ICD-10-CM | POA: Diagnosis not present

## 2023-08-15 DIAGNOSIS — E785 Hyperlipidemia, unspecified: Secondary | ICD-10-CM | POA: Diagnosis present

## 2023-08-15 DIAGNOSIS — R350 Frequency of micturition: Secondary | ICD-10-CM | POA: Diagnosis not present

## 2023-08-15 DIAGNOSIS — E876 Hypokalemia: Secondary | ICD-10-CM | POA: Diagnosis not present

## 2023-08-15 DIAGNOSIS — Z8249 Family history of ischemic heart disease and other diseases of the circulatory system: Secondary | ICD-10-CM

## 2023-08-15 DIAGNOSIS — Z7984 Long term (current) use of oral hypoglycemic drugs: Secondary | ICD-10-CM | POA: Diagnosis not present

## 2023-08-15 DIAGNOSIS — E119 Type 2 diabetes mellitus without complications: Secondary | ICD-10-CM | POA: Diagnosis not present

## 2023-08-15 DIAGNOSIS — D638 Anemia in other chronic diseases classified elsewhere: Secondary | ICD-10-CM | POA: Diagnosis present

## 2023-08-15 DIAGNOSIS — Z7982 Long term (current) use of aspirin: Secondary | ICD-10-CM | POA: Diagnosis not present

## 2023-08-15 DIAGNOSIS — K219 Gastro-esophageal reflux disease without esophagitis: Secondary | ICD-10-CM | POA: Diagnosis present

## 2023-08-15 DIAGNOSIS — I4891 Unspecified atrial fibrillation: Secondary | ICD-10-CM | POA: Diagnosis not present

## 2023-08-15 DIAGNOSIS — R5383 Other fatigue: Secondary | ICD-10-CM | POA: Diagnosis not present

## 2023-08-15 DIAGNOSIS — J9600 Acute respiratory failure, unspecified whether with hypoxia or hypercapnia: Secondary | ICD-10-CM | POA: Diagnosis not present

## 2023-08-15 DIAGNOSIS — I480 Paroxysmal atrial fibrillation: Secondary | ICD-10-CM | POA: Diagnosis not present

## 2023-08-15 DIAGNOSIS — Z803 Family history of malignant neoplasm of breast: Secondary | ICD-10-CM | POA: Diagnosis not present

## 2023-08-15 DIAGNOSIS — K529 Noninfective gastroenteritis and colitis, unspecified: Secondary | ICD-10-CM | POA: Diagnosis present

## 2023-08-15 DIAGNOSIS — I10 Essential (primary) hypertension: Secondary | ICD-10-CM | POA: Diagnosis present

## 2023-08-15 DIAGNOSIS — R109 Unspecified abdominal pain: Secondary | ICD-10-CM

## 2023-08-15 DIAGNOSIS — Z79899 Other long term (current) drug therapy: Secondary | ICD-10-CM

## 2023-08-15 DIAGNOSIS — N390 Urinary tract infection, site not specified: Secondary | ICD-10-CM | POA: Diagnosis not present

## 2023-08-15 DIAGNOSIS — R112 Nausea with vomiting, unspecified: Secondary | ICD-10-CM

## 2023-08-15 DIAGNOSIS — Z8673 Personal history of transient ischemic attack (TIA), and cerebral infarction without residual deficits: Secondary | ICD-10-CM

## 2023-08-15 DIAGNOSIS — K649 Unspecified hemorrhoids: Secondary | ICD-10-CM | POA: Diagnosis present

## 2023-08-15 DIAGNOSIS — Z807 Family history of other malignant neoplasms of lymphoid, hematopoietic and related tissues: Secondary | ICD-10-CM | POA: Diagnosis not present

## 2023-08-15 DIAGNOSIS — I358 Other nonrheumatic aortic valve disorders: Secondary | ICD-10-CM | POA: Diagnosis not present

## 2023-08-15 DIAGNOSIS — R7989 Other specified abnormal findings of blood chemistry: Secondary | ICD-10-CM

## 2023-08-15 DIAGNOSIS — E871 Hypo-osmolality and hyponatremia: Principal | ICD-10-CM | POA: Diagnosis present

## 2023-08-15 DIAGNOSIS — R0602 Shortness of breath: Secondary | ICD-10-CM | POA: Diagnosis not present

## 2023-08-15 DIAGNOSIS — N179 Acute kidney failure, unspecified: Secondary | ICD-10-CM | POA: Diagnosis not present

## 2023-08-15 DIAGNOSIS — J189 Pneumonia, unspecified organism: Secondary | ICD-10-CM

## 2023-08-15 DIAGNOSIS — Z880 Allergy status to penicillin: Secondary | ICD-10-CM

## 2023-08-15 DIAGNOSIS — R0989 Other specified symptoms and signs involving the circulatory and respiratory systems: Secondary | ICD-10-CM | POA: Diagnosis not present

## 2023-08-15 DIAGNOSIS — H409 Unspecified glaucoma: Secondary | ICD-10-CM | POA: Diagnosis present

## 2023-08-15 LAB — COMPREHENSIVE METABOLIC PANEL WITH GFR
ALT: 19 U/L (ref 0–35)
ALT: 28 U/L (ref 0–44)
AST: 35 U/L (ref 0–37)
AST: 51 U/L — ABNORMAL HIGH (ref 15–41)
Albumin: 3.5 g/dL (ref 3.5–5.2)
Albumin: 4 g/dL (ref 3.5–5.0)
Alkaline Phosphatase: 44 U/L (ref 39–117)
Alkaline Phosphatase: 61 U/L (ref 38–126)
Anion gap: 14 (ref 5–15)
BUN: 16 mg/dL (ref 6–23)
BUN: 16 mg/dL (ref 8–23)
CO2: 27 meq/L (ref 19–32)
CO2: 25 mmol/L (ref 22–32)
Calcium: 8.2 mg/dL — ABNORMAL LOW (ref 8.4–10.5)
Calcium: 9 mg/dL (ref 8.9–10.3)
Chloride: 77 meq/L — ABNORMAL LOW (ref 96–112)
Chloride: 76 mmol/L — ABNORMAL LOW (ref 98–111)
Creatinine, Ser: 1 mg/dL (ref 0.40–1.20)
Creatinine, Ser: 1.24 mg/dL — ABNORMAL HIGH (ref 0.44–1.00)
GFR, Estimated: 45 mL/min — ABNORMAL LOW (ref >60)
GFR: 54.57 mL/min — ABNORMAL LOW (ref >60.00)
Glucose, Bld: 135 mg/dL — ABNORMAL HIGH (ref 70–99)
Glucose, Bld: 156 mg/dL — ABNORMAL HIGH (ref 70–99)
Potassium: 3.2 meq/L — ABNORMAL LOW (ref 3.5–5.1)
Potassium: 2.9 mmol/L — ABNORMAL LOW (ref 3.5–5.1)
Sodium: 115 meq/L — CL (ref 135–145)
Sodium: 115 mmol/L — CL (ref 135–145)
Total Bilirubin: 0.9 mg/dL (ref 0.2–1.2)
Total Bilirubin: 0.8 mg/dL (ref 0.0–1.2)
Total Protein: 6.3 g/dL (ref 6.0–8.3)
Total Protein: 7 g/dL (ref 6.5–8.1)

## 2023-08-15 LAB — URINALYSIS, ROUTINE W REFLEX MICROSCOPIC
Bilirubin Urine: NEGATIVE
Bilirubin Urine: NEGATIVE
Glucose, UA: NEGATIVE mg/dL
Ketones, ur: NEGATIVE
Ketones, ur: NEGATIVE mg/dL
Leukocytes,Ua: NEGATIVE
Leukocytes,Ua: NEGATIVE
Nitrite: NEGATIVE
Nitrite: NEGATIVE
Protein, ur: 30 mg/dL — AB
Specific Gravity, Urine: 1.015 (ref 1.000–1.030)
Specific Gravity, Urine: 1.019 (ref 1.005–1.030)
Total Protein, Urine: 100 — AB
Urine Glucose: NEGATIVE
Urobilinogen, UA: 0.2 (ref 0.0–1.0)
pH: 6 (ref 5.0–8.0)
pH: 6.5 (ref 5.0–8.0)

## 2023-08-15 LAB — BASIC METABOLIC PANEL WITH GFR
Anion gap: 13 (ref 5–15)
BUN: 15 mg/dL (ref 8–23)
CO2: 24 mmol/L (ref 22–32)
Calcium: 8.2 mg/dL — ABNORMAL LOW (ref 8.9–10.3)
Chloride: 83 mmol/L — ABNORMAL LOW (ref 98–111)
Creatinine, Ser: 1.06 mg/dL — ABNORMAL HIGH (ref 0.44–1.00)
GFR, Estimated: 54 mL/min — ABNORMAL LOW (ref >60)
Glucose, Bld: 144 mg/dL — ABNORMAL HIGH (ref 70–99)
Potassium: 2.8 mmol/L — ABNORMAL LOW (ref 3.5–5.1)
Sodium: 120 mmol/L — ABNORMAL LOW (ref 135–145)

## 2023-08-15 LAB — CBC WITH DIFFERENTIAL/PLATELET
Abs Immature Granulocytes: 0.05 10*3/uL (ref 0.00–0.07)
Basophils Absolute: 0 10*3/uL (ref 0.0–0.1)
Basophils Absolute: 0 10*3/uL (ref 0.0–0.1)
Basophils Relative: 0.2 % (ref 0.0–3.0)
Basophils Relative: 0 %
Eosinophils Absolute: 0 10*3/uL (ref 0.0–0.7)
Eosinophils Absolute: 0 10*3/uL (ref 0.0–0.5)
Eosinophils Relative: 0 % (ref 0.0–5.0)
Eosinophils Relative: 0 %
HCT: 31.3 % — ABNORMAL LOW (ref 36.0–46.0)
HCT: 30 % — ABNORMAL LOW (ref 36.0–46.0)
Hemoglobin: 11 g/dL — ABNORMAL LOW (ref 12.0–15.0)
Hemoglobin: 10.8 g/dL — ABNORMAL LOW (ref 12.0–15.0)
Immature Granulocytes: 1 %
Lymphocytes Relative: 4.7 % — ABNORMAL LOW (ref 12.0–46.0)
Lymphocytes Relative: 5 %
Lymphs Abs: 0.5 10*3/uL — ABNORMAL LOW (ref 0.7–4.0)
Lymphs Abs: 0.4 10*3/uL — ABNORMAL LOW (ref 0.7–4.0)
MCH: 28.7 pg (ref 26.0–34.0)
MCHC: 35 g/dL (ref 30.0–36.0)
MCHC: 36 g/dL (ref 30.0–36.0)
MCV: 83.2 fl (ref 78.0–100.0)
MCV: 79.8 fL — ABNORMAL LOW (ref 80.0–100.0)
Monocytes Absolute: 0.5 10*3/uL (ref 0.1–1.0)
Monocytes Absolute: 0.5 10*3/uL (ref 0.1–1.0)
Monocytes Relative: 5.2 % (ref 3.0–12.0)
Monocytes Relative: 5 %
Neutro Abs: 8.7 10*3/uL — ABNORMAL HIGH (ref 1.4–7.7)
Neutro Abs: 8.3 10*3/uL — ABNORMAL HIGH (ref 1.7–7.7)
Neutrophils Relative %: 89.9 % — ABNORMAL HIGH (ref 43.0–77.0)
Neutrophils Relative %: 89 %
Platelets: 203 10*3/uL (ref 150.0–400.0)
Platelets: 192 10*3/uL (ref 150–400)
RBC: 3.77 Mil/uL — ABNORMAL LOW (ref 3.87–5.11)
RBC: 3.76 MIL/uL — ABNORMAL LOW (ref 3.87–5.11)
RDW: 14.5 % (ref 11.5–15.5)
RDW: 13.6 % (ref 11.5–15.5)
WBC: 9.7 10*3/uL (ref 4.0–10.5)
WBC: 9.3 10*3/uL (ref 4.0–10.5)
nRBC: 0 % (ref 0.0–0.2)

## 2023-08-15 LAB — MAGNESIUM: Magnesium: 0.9 mg/dL — CL (ref 1.7–2.4)

## 2023-08-15 LAB — RESP PANEL BY RT-PCR (RSV, FLU A&B, COVID)  RVPGX2
Influenza A by PCR: NEGATIVE
Influenza B by PCR: NEGATIVE
Resp Syncytial Virus by PCR: NEGATIVE
SARS Coronavirus 2 by RT PCR: NEGATIVE

## 2023-08-15 LAB — TYPE AND SCREEN
ABO/RH(D): A POS
Antibody Screen: NEGATIVE

## 2023-08-15 LAB — GLUCOSE, CAPILLARY: Glucose-Capillary: 156 mg/dL — ABNORMAL HIGH (ref 70–99)

## 2023-08-15 LAB — SEDIMENTATION RATE: Sed Rate: 26 mm/h (ref 0–30)

## 2023-08-15 LAB — TROPONIN I (HIGH SENSITIVITY): Troponin I (High Sensitivity): 8 ng/L (ref <18)

## 2023-08-15 LAB — CK: Total CK: 586 U/L — ABNORMAL HIGH (ref 38–234)

## 2023-08-15 LAB — OSMOLALITY: Osmolality: 247 mosm/kg — CL (ref 275–295)

## 2023-08-15 LAB — SODIUM, URINE, RANDOM: Sodium, Ur: 36 mmol/L

## 2023-08-15 MED ORDER — ONDANSETRON HCL 4 MG PO TABS: 4.0000 mg | ORAL_TABLET | Freq: Three times a day (TID) | ORAL | 0 refills | Status: DC | PRN

## 2023-08-15 MED ORDER — LACTATED RINGERS IV SOLN: INTRAVENOUS | Status: DC

## 2023-08-15 MED ORDER — MAGNESIUM SULFATE 4 GM/100ML IV SOLN
4.0000 g | Freq: Once | INTRAVENOUS | Status: AC
  Administered 2023-08-16: 4 g via INTRAVENOUS
  Filled 2023-08-15: qty 100

## 2023-08-15 MED ORDER — IOHEXOL 300 MG/ML  SOLN
100.0000 mL | Freq: Once | INTRAMUSCULAR | Status: AC | PRN
  Administered 2023-08-15: 100 mL via INTRAVENOUS

## 2023-08-15 MED ORDER — NITROFURANTOIN MONOHYD MACRO 100 MG PO CAPS: 100.0000 mg | ORAL_CAPSULE | Freq: Two times a day (BID) | ORAL | 0 refills | Status: DC

## 2023-08-15 MED ORDER — INSULIN ASPART 100 UNIT/ML IJ SOLN
0.0000 [IU] | Freq: Every day | INTRAMUSCULAR | Status: DC
  Administered 2023-08-17: 3 [IU] via SUBCUTANEOUS

## 2023-08-15 MED ORDER — LABETALOL HCL 5 MG/ML IV SOLN
20.0000 mg | INTRAVENOUS | Status: DC | PRN
  Administered 2023-08-16: 20 mg via INTRAVENOUS
  Filled 2023-08-15: qty 4

## 2023-08-15 MED ORDER — ACETAMINOPHEN 650 MG RE SUPP: 650.0000 mg | Freq: Four times a day (QID) | RECTAL | Status: DC | PRN

## 2023-08-15 MED ORDER — PANTOPRAZOLE SODIUM 40 MG PO TBEC
40.0000 mg | DELAYED_RELEASE_TABLET | Freq: Every day | ORAL | Status: DC
  Administered 2023-08-15 – 2023-08-18 (×4): 40 mg via ORAL
  Filled 2023-08-15 (×4): qty 1

## 2023-08-15 MED ORDER — POTASSIUM CHLORIDE 10 MEQ/100ML IV SOLN
10.0000 meq | INTRAVENOUS | Status: AC
  Administered 2023-08-15 – 2023-08-16 (×2): 10 meq via INTRAVENOUS
  Filled 2023-08-15 (×2): qty 100

## 2023-08-15 MED ORDER — SODIUM CHLORIDE 0.9 % IV SOLN
1.0000 g | INTRAVENOUS | Status: DC
  Administered 2023-08-16 – 2023-08-17 (×2): 1 g via INTRAVENOUS
  Filled 2023-08-15 (×2): qty 10

## 2023-08-15 MED ORDER — POLYETHYLENE GLYCOL 3350 17 G PO PACK: 17.0000 g | PACK | Freq: Every day | ORAL | Status: DC | PRN

## 2023-08-15 MED ORDER — POTASSIUM CHLORIDE CRYS ER 20 MEQ PO TBCR
40.0000 meq | EXTENDED_RELEASE_TABLET | ORAL | Status: AC
  Administered 2023-08-15 – 2023-08-16 (×2): 40 meq via ORAL
  Filled 2023-08-15 (×2): qty 2

## 2023-08-15 MED ORDER — METRONIDAZOLE 500 MG/100ML IV SOLN
500.0000 mg | Freq: Two times a day (BID) | INTRAVENOUS | Status: DC
  Administered 2023-08-15 – 2023-08-16 (×2): 500 mg via INTRAVENOUS
  Filled 2023-08-15 (×2): qty 100

## 2023-08-15 MED ORDER — SODIUM CHLORIDE 0.9% FLUSH
3.0000 mL | Freq: Two times a day (BID) | INTRAVENOUS | Status: DC
  Administered 2023-08-15 – 2023-08-18 (×6): 3 mL via INTRAVENOUS

## 2023-08-15 MED ORDER — ATORVASTATIN CALCIUM 40 MG PO TABS
80.0000 mg | ORAL_TABLET | Freq: Every day | ORAL | Status: DC
  Filled 2023-08-15: qty 2

## 2023-08-15 MED ORDER — ACETAMINOPHEN 325 MG PO TABS
650.0000 mg | ORAL_TABLET | Freq: Four times a day (QID) | ORAL | Status: DC | PRN
  Administered 2023-08-15: 650 mg via ORAL
  Filled 2023-08-15 (×2): qty 2

## 2023-08-15 MED ORDER — POTASSIUM CHLORIDE 20 MEQ PO PACK
40.0000 meq | PACK | ORAL | Status: DC
  Filled 2023-08-15: qty 2

## 2023-08-15 MED ORDER — POTASSIUM CHLORIDE 20 MEQ PO PACK: 80.0000 meq | PACK | Freq: Once | ORAL | Status: DC

## 2023-08-15 MED ORDER — AZITHROMYCIN 500 MG IV SOLR
500.0000 mg | INTRAVENOUS | Status: DC
  Administered 2023-08-15 – 2023-08-16 (×2): 500 mg via INTRAVENOUS
  Filled 2023-08-15 (×3): qty 5

## 2023-08-15 MED ORDER — ASPIRIN 81 MG PO TBEC
81.0000 mg | DELAYED_RELEASE_TABLET | Freq: Every day | ORAL | Status: DC
  Administered 2023-08-15 – 2023-08-18 (×4): 81 mg via ORAL
  Filled 2023-08-15 (×4): qty 1

## 2023-08-15 MED ORDER — ENOXAPARIN SODIUM 40 MG/0.4ML IJ SOSY
40.0000 mg | PREFILLED_SYRINGE | INTRAMUSCULAR | Status: DC
Start: 2023-08-16 — End: 2023-08-15

## 2023-08-15 MED ORDER — SODIUM CHLORIDE 0.9 % IV BOLUS
1000.0000 mL | Freq: Once | INTRAVENOUS | Status: AC
  Administered 2023-08-15: 1000 mL via INTRAVENOUS

## 2023-08-15 MED ORDER — HYDRALAZINE HCL 20 MG/ML IJ SOLN
10.0000 mg | INTRAMUSCULAR | Status: DC | PRN
  Administered 2023-08-16: 10 mg via INTRAVENOUS
  Filled 2023-08-15: qty 1

## 2023-08-15 MED ORDER — INSULIN ASPART 100 UNIT/ML IJ SOLN
0.0000 [IU] | Freq: Three times a day (TID) | INTRAMUSCULAR | Status: DC
  Administered 2023-08-16 (×2): 3 [IU] via SUBCUTANEOUS
  Administered 2023-08-17 (×3): 2 [IU] via SUBCUTANEOUS
  Administered 2023-08-18: 5 [IU] via SUBCUTANEOUS
  Administered 2023-08-18: 2 [IU] via SUBCUTANEOUS

## 2023-08-15 MED ORDER — SODIUM CHLORIDE 0.9 % IV SOLN
1.0000 g | Freq: Once | INTRAVENOUS | Status: AC
  Administered 2023-08-15: 1 g via INTRAVENOUS
  Filled 2023-08-15: qty 10

## 2023-08-15 NOTE — Progress Notes (Signed)
   Subjective:    Patient ID: Casey Bryan, female    DOB: Jun 19, 1945, 78 y.o.   MRN: [id number]  DOS:  08/15/2023 Type of visit - description:     Review of Systems See above   Past Medical History:  Diagnosis Date   Amaurosis fugax 12/12/2015   Diabetes mellitus    GERD (gastroesophageal reflux disease)    Glaucoma 04-2012   Hyperlipemia    Hypertension     Past Surgical History:  Procedure Laterality Date   NO PAST SURGERIES      Current Outpatient Medications  Medication Instructions   aspirin EC 81 mg, Daily   atorvastatin  (LIPITOR) 80 mg, Oral, Daily   Blood Glucose Monitoring Suppl (ADVOCATE REDI-CODE) DEVI by Does not apply route. Reported on 04/22/2015   Calcium  Carbonate-Vit D-Min (CALCIUM  1200 PO) 1,000 mg   cephALEXin (KEFLEX) 500 mg, 4 times daily   chlorthalidone  (HYGROTON ) 25 mg, Oral, Daily   esomeprazole  (NEXIUM ) 40 mg, Oral, 2 times daily before meals   glucose blood (ONETOUCH ULTRA) test strip USE TO CHECK BLOOD SUGARS ONCE DAILY   irbesartan  (AVAPRO ) 300 mg, Oral, Daily   Lancets (ONETOUCH DELICA PLUS LANCET33G) MISC USE TO CHECK BLOOD SUGARS ONCE DAILY   lidocaine  (XYLOCAINE ) 5 % ointment 1 application , 2 times daily PRN   metFORMIN  (GLUCOPHAGE ) 1,000 mg, Oral, 2 times daily with meals   metoprolol  tartrate (LOPRESSOR ) 100 mg, Oral, 2 times daily   Multiple Vitamin (MULTIVITAMIN) tablet 1 tablet, Daily   Multiple Vitamins-Minerals (EYE VITAMINS) CAPS 1 capsule, Daily   nitrofurantoin (macrocrystal-monohydrate) (MACROBID) 100 mg, Oral, 2 times daily   ondansetron  (ZOFRAN ) 4 mg, Oral, Every 8 hours PRN       Objective:   Physical Exam BP 124/66   Pulse 88   Temp 98.3 F (36.8 C)   Ht 5\' 4"  (1.626 m)   Wt 171 lb 9.6 oz (77.8 kg)   SpO2 95%   BMI 29.46 kg/m      Assessment

## 2023-08-15 NOTE — Assessment & Plan Note (Signed)
 Patient baseline creatinine seems to be normal based on review of prior labs.  Currently is 1.24.  Given finding of severe hyponatremia, reported vomiting and diarrhea and poor p.o. intake, most consistent with a prerenal state.  Patient already received a liter of saline bolus in the ER.  Urinalysis showing hemoglobin positive by dipstick but no red blood cells in the microscopic exam.  There is some proteinuria as well.  Concern for ATN.  I will check a CK level.

## 2023-08-15 NOTE — Assessment & Plan Note (Signed)
 Hold chlorthalidone , avapro  and lopressor  . Use PRN labetalol and hydraalazine tonight.

## 2023-08-15 NOTE — Assessment & Plan Note (Signed)
,   Rate controlled.  Check magnesium , correct potassium.  Check TSH and echo in the morning I sent a message to Reno Cash to kindly assign the cardiology service in the morning.

## 2023-08-15 NOTE — Progress Notes (Signed)
 Dr. Urban Garden giving report.  78 yo lady cc abdnormal labs. PMH HTN DM II.   Cough, sob weakness X 3 days. Seen on Friday Dx with UTI . S.p keflex. Saw pcp today still fatigue, in bed all weekend, cough. Not been eating and drinking well.   Labs done today - sodium 115. Asked to come to ER.  Exam is benign. Vitals stable. Left lower chest crackles. Some abd tenderness on exam by ER doc. CT abd pelvis - left lower lobe consolidation. S/p NS bolus.   Ceftriaxone.    A/p : PNA hyponatremia - will admit to step down. Check serum osm and urine sodium. Plan to trend sodium. Will assume care once patient arrives at [facility]. Full H&P at that time. ______________________________________________________________________________________  Labs on Admission:  Results for orders placed or performed during the hospital encounter of 08/15/23 (from the past 24 hours)  Comprehensive metabolic panel     Status: Abnormal   Collection Time: 08/15/23  3:38 PM  Result Value Ref Range   Sodium 115 (LL) 135 - 145 mmol/L   Potassium 2.9 (L) 3.5 - 5.1 mmol/L   Chloride 76 (L) 98 - 111 mmol/L   CO2 25 22 - 32 mmol/L   Glucose, Bld 156 (H) 70 - 99 mg/dL   BUN 16 8 - 23 mg/dL   Creatinine, Ser 1.61 (H) 0.44 - 1.00 mg/dL   Calcium  9.0 8.9 - 10.3 mg/dL   Total Protein 7.0 6.5 - 8.1 g/dL   Albumin 4.0 3.5 - 5.0 g/dL   AST 51 (H) 15 - 41 U/L   ALT 28 0 - 44 U/L   Alkaline Phosphatase 61 38 - 126 U/L   Total Bilirubin 0.8 0.0 - 1.2 mg/dL   GFR, Estimated 45 (L) >60 mL/min   Anion gap 14 5 - 15  CBC with Differential     Status: Abnormal   Collection Time: 08/15/23  3:38 PM  Result Value Ref Range   WBC 9.3 4.0 - 10.5 K/uL   RBC 3.76 (L) 3.87 - 5.11 MIL/uL   Hemoglobin 10.8 (L) 12.0 - 15.0 g/dL   HCT 09.6 (L) 04.5 - 40.9 %   MCV 79.8 (L) 80.0 - 100.0 fL   MCH 28.7 26.0 - 34.0 pg   MCHC 36.0 30.0 - 36.0 g/dL   RDW 81.1 91.4 - 78.2 %   Platelets 192 150 - 400 K/uL   nRBC 0.0 0.0 - 0.2 %   Neutrophils  Relative % 89 %   Neutro Abs 8.3 (H) 1.7 - 7.7 K/uL   Lymphocytes Relative 5 %   Lymphs Abs 0.4 (L) 0.7 - 4.0 K/uL   Monocytes Relative 5 %   Monocytes Absolute 0.5 0.1 - 1.0 K/uL   Eosinophils Relative 0 %   Eosinophils Absolute 0.0 0.0 - 0.5 K/uL   Basophils Relative 0 %   Basophils Absolute 0.0 0.0 - 0.1 K/uL   Immature Granulocytes 1 %   Abs Immature Granulocytes 0.05 0.00 - 0.07 K/uL  Resp panel by RT-PCR (RSV, Flu A&B, Covid) Anterior Nasal Swab     Status: None   Collection Time: 08/15/23  4:03 PM   Specimen: Anterior Nasal Swab  Result Value Ref Range   SARS Coronavirus 2 by RT PCR NEGATIVE NEGATIVE   Influenza A by PCR NEGATIVE NEGATIVE   Influenza B by PCR NEGATIVE NEGATIVE   Resp Syncytial Virus by PCR NEGATIVE NEGATIVE   Basic Metabolic Panel: Recent Labs  Lab 08/15/23 1349 08/15/23  1538  NA 115* 115*  K 3.2* 2.9*  CL 77* 76*  CO2 27 25  GLUCOSE 135* 156*  BUN 16 16  CREATININE 1.00 1.24*  CALCIUM  8.2* 9.0   Liver Function Tests: Recent Labs  Lab 08/15/23 1349 08/15/23 1538  AST 35 51*  ALT 19 28  ALKPHOS 44 61  BILITOT 0.9 0.8  PROT 6.3 7.0  ALBUMIN 3.5 4.0   No results for input(s): "LIPASE", "AMYLASE" in the last 168 hours. No results for input(s): "AMMONIA" in the last 168 hours. CBC: Recent Labs  Lab 08/15/23 1349 08/15/23 1538  WBC 9.7 9.3  NEUTROABS 8.7* 8.3*  HGB 11.0* 10.8*  HCT 31.3* 30.0*  MCV 83.2 79.8*  PLT 203.0 192   Cardiac Enzymes: No results for input(s): "CKTOTAL", "CKMB", "CKMBINDEX", "TROPONINIHS" in the last 168 hours.  BNP (last 3 results) No results for input(s): "PROBNP" in the last 8760 hours. CBG: No results for input(s): "GLUCAP" in the last 168 hours.  Radiological Exams on Admission:  CT ABDOMEN PELVIS W CONTRAST Result Date: 08/15/2023 CLINICAL DATA:  Abdominal pain. EXAM: CT ABDOMEN AND PELVIS WITH CONTRAST TECHNIQUE: Multidetector CT imaging of the abdomen and pelvis was performed using the standard  protocol following bolus administration of intravenous contrast. RADIATION DOSE REDUCTION: This exam was performed according to the departmental dose-optimization program which includes automated exposure control, adjustment of the mA and/or kV according to patient size and/or use of iterative reconstruction technique. CONTRAST:  100mL OMNIPAQUE IOHEXOL 300 MG/ML  SOLN COMPARISON:  CT abdomen pelvis dated 10/27/2022. FINDINGS: Lower chest: Patchy area of airspace opacity in the visualized left lower lobe concerning for pneumonia or aspiration. Clinical correlation and follow-up to resolution recommended. Trace pericardial effusion. No intra-abdominal free air or free fluid. Hepatobiliary: The liver is unremarkable. No biliary dilatation. The gallbladder is unremarkable. Pancreas: Unremarkable. No pancreatic ductal dilatation or surrounding inflammatory changes. Spleen: Normal in size without focal abnormality. Adrenals/Urinary Tract: The adrenal glands are unremarkable. Right renal parapelvic cyst. There is no hydronephrosis on either side. There is symmetric enhancement and excretion of contrast by both kidneys. The visualized ureters and urinary bladder appear unremarkable. Stomach/Bowel: There is no bowel obstruction or active inflammation. The appendix is normal. Vascular/Lymphatic: Advanced aortoiliac atherosclerotic disease. The IVC is unremarkable. No portal venous gas. There is no adenopathy. Reproductive: The uterus is grossly unremarkable. No suspicious adnexal masses. Other: None Musculoskeletal: Osteopenia with degenerative changes of the spine. No acute osseous pathology. IMPRESSION: 1. No acute intra-abdominal or pelvic pathology. 2. Left lower lobe pneumonia or aspiration. 3.  Aortic Atherosclerosis (ICD10-I70.0). Electronically Signed   By: Angus Bark M.D.   On: 08/15/2023 17:32   DG Chest Portable 1 View Result Date: 08/15/2023 CLINICAL DATA:  Short of breath EXAM: PORTABLE CHEST 1 VIEW  COMPARISON:  09/10/2003 FINDINGS: Single frontal view of the chest demonstrates mild enlargement the cardiac silhouette. There is increased pulmonary vascular congestion and interstitial prominence, compatible with fluid overload and interstitial edema. No acute airspace disease, effusion, or pneumothorax. No acute bony abnormalities. IMPRESSION: 1. Constellation of findings most consistent with mild congestive heart failure and developing interstitial edema. Electronically Signed   By: Bobbye Burrow M.D.   On: 08/15/2023 16:50    chest X-ray   No intake/output data recorded. No intake/output data recorded.     _______________________________

## 2023-08-15 NOTE — Telephone Encounter (Signed)
 Information obtained from daughter Producer, television/film/video Complaint: fatigue, N/V/D Symptoms: fatigue, abd pain, N/V/D Frequency: about 3 days ago Pertinent Negatives: Patient denies fever, SOB, GU s/s, dizziness Disposition: [] ED /[] Urgent Care (no appt availability in office) / [x] Appointment(In office/virtual)/ []  [facility]/ [] Home Care/ [] Refused Recommended Disposition /[] [facility]/ []  Follow-up with PCP Additional Notes: Pt daughter states that daughter was seen in the UC a couple days ago and dx with UTI. Daughter states that they started her on abx. Pt has increased fatigue and now is experiencing upper abd pain. Pt still has GB and appendix. Per daughter pt has been drinking water since UC. Pt admits to hemorrhoids also bleeding recently. Pt states that the blood is bright red.  Pt scheduled today.  Copied from CRM [id number]. Topic: Clinical - Red Word Triage >> Aug 15, 2023  8:43 AM Juluis Ok wrote: [facility] that prompted transfer to Nurse Triage: Extreme fatigue, headaches, bitter taste in mouth, vomiting, diarrhea Reason for Disposition  [1] MODERATE weakness (i.e., interferes with work, school, normal activities) AND [2] cause unknown  (Exceptions: Weakness from acute minor illness or poor fluid intake; weakness is chronic and not worse.)  Answer Assessment - Initial Assessment Questions 1. DESCRIPTION: "Describe how you are feeling."     Fatigue, N/V/D, bitter taste in mouth, HA 2. SEVERITY: "How bad is it?"  "Can you stand and walk?"   - MILD (0-3): Feels weak or tired, but does not interfere with work, school or normal activities.   - MODERATE (4-7): Able to stand and walk; weakness interferes with work, school, or normal activities.   - SEVERE (8-10): Unable to stand or walk; unable to do usual activities.     moderate 3. ONSET: "When did these symptoms begin?" (e.g., hours, days, weeks, months)     About 3 days ago 4. CAUSE: "What do you think is causing the  weakness or fatigue?" (e.g., not drinking enough fluids, medical problem, trouble sleeping)     Pt daughter states that she is drinking waters,  5. NEW MEDICINES:  "Have you started on any new medicines recently?" (e.g., opioid pain medicines, benzodiazepines, muscle relaxants, antidepressants, antihistamines, neuroleptics, beta blockers)     Abx started for possible UTI, pt was seen in UC about 3 days ago 6. OTHER SYMPTOMS: "Do you have any other symptoms?" (e.g., chest pain, fever, cough, SOB, vomiting, diarrhea, bleeding, other areas of pain)     ABD upper pain, N/V/D,  Protocols used: Weakness (Generalized) and Fatigue-A-AH

## 2023-08-15 NOTE — Patient Instructions (Addendum)
 AZO cranberry tab daily Probiotic daily Increase fluids, no caffeine, no milk  Food Choices to Help Relieve Diarrhea, Adult Diarrhea can make you feel weak and cause you to become dehydrated. Dehydration is a condition in which there is not enough water or other fluids in the body. It is important to choose the right foods and drinks to: Relieve diarrhea. Replace lost fluids and nutrients. Prevent dehydration. What are tips for following this plan? Relieving diarrhea Avoid foods that make your diarrhea worse. These may include: Foods and drinks that are sweetened with high-fructose corn syrup, honey, or sweeteners such as xylitol, sorbitol, and mannitol. Check food labels for these ingredients. Fried, greasy, or spicy foods. Raw fruits and vegetables. Eat foods that are rich in probiotics. These include foods such as yogurt and fermented milk products. Probiotics can help increase healthy bacteria in your stomach and intestines (gastrointestinal or GI tract). This may help digestion and stop diarrhea. If you have lactose intolerance, avoid dairy products. These may make your diarrhea worse. Take medicine to help stop diarrhea only as told by your health care provider. Replacing nutrients  Eat bland, easy-to-digest foods in small amounts as you are able, until your diarrhea starts to get better. These foods include bananas, applesauce, rice, toast, and crackers. Over time, add nutrient-rich foods as your body tolerates them or as told by your health care provider. These include: Well-cooked protein foods, such as eggs, lean meats like fish or chicken without skin, and tofu. Peeled, seeded, and soft-cooked fruits and vegetables. Low-fat dairy products. Whole grains. Take vitamin and mineral supplements as told by your health care provider. Preventing dehydration  Start by sipping water or a solution to prevent dehydration (oral rehydration solution, or ORS). This is a drink that helps  replace fluids and minerals your body has lost. You can buy an ORS at pharmacies and retail stores. Try to drink at least 8-10 cups (2,000-2,500 mL) of fluid each day to help replace lost fluids. If your urine is pale yellow, you are getting enough fluids. You may drink other liquids in addition to water, such as fruit juice that you have added water to (diluted fruit juice) or low-calorie sports drinks, as tolerated or as told by your health care provider. Avoid drinks with caffeine, such as coffee, tea, or soft drinks. Avoid alcohol. This information is not intended to replace advice given to you by your health care provider. Make sure you discuss any questions you have with your health care provider. Document Revised: 09/22/2021 Document Reviewed: 09/22/2021 Elsevier Patient Education  2024 [employer].

## 2023-08-15 NOTE — Assessment & Plan Note (Signed)
 This really is incidentally discovered as patient did not report any respiratory symptoms.  Although there was some crackles on the left base.  Given history of vomiting, we had to consider aspiration as well.  Empiric treatment has been started with ceftriaxone plus azithromycin plus metronidazole.

## 2023-08-15 NOTE — Assessment & Plan Note (Addendum)
 This presumed chronic, severe and without associated severe symptoms.  At this time patient's urine sodium is pending.  However my presumption is that this is prerenal given the presentation as mentioned above.  We will monitor patient's basic metabolic panel frequently and target about 6 to 8 mEq correction in the next 24 hours. Which would include correction of potasssium. Will allow diet with fluid restriction.  However, in case of any concern of hypovolemia or hypotension, will give fluid boluses as needed.  Check cortisol tSH.

## 2023-08-15 NOTE — ED Provider Notes (Signed)
 [facility] AT [facility] Provider Note   CSN: [id number] Arrival date & time: 08/15/23  1529     History Chief Complaint  Patient presents with   Abnormal Lab    sodium    HPI Casey Bryan is a 78 y.o. female presenting for chief complaint of abnormal labs. Has been sick since Saturday. Fatigue and acid reflux and abdominal pain all since Saturday. No URI symptoms.   Patient's recorded medical, surgical, social, medication list and allergies were reviewed in the Snapshot window as part of the initial history.   Review of Systems   Review of Systems  Constitutional:  Positive for chills, fatigue and fever.  HENT:  Negative for ear pain and sore throat.   Eyes:  Negative for pain and visual disturbance.  Respiratory:  Positive for cough. Negative for shortness of breath.   Cardiovascular:  Negative for chest pain and palpitations.  Gastrointestinal:  Negative for abdominal pain and vomiting.  Genitourinary:  Negative for dysuria and hematuria.  Musculoskeletal:  Negative for arthralgias and back pain.  Skin:  Negative for color change and rash.  Neurological:  Negative for seizures and syncope.  All other systems reviewed and are negative.   Physical Exam Updated Vital Signs BP (!) 136/48   Pulse (!) 114   Temp 99 F (37.2 C) (Oral)   Resp (!) 23   Wt 76.1 kg   SpO2 95%   BMI 28.80 kg/m  Physical Exam Vitals and nursing note reviewed.  Constitutional:      General: She is not in acute distress.    Appearance: She is well-developed.  HENT:     Head: Normocephalic and atraumatic.  Eyes:     Conjunctiva/sclera: Conjunctivae normal.  Cardiovascular:     Rate and Rhythm: Normal rate and regular rhythm.     Heart sounds: No murmur heard. Pulmonary:     Effort: Pulmonary effort is normal. No respiratory distress.     Breath sounds: Normal breath sounds.  Abdominal:     General: There is no distension.     Palpations: Abdomen is  soft.     Tenderness: There is no abdominal tenderness. There is no right CVA tenderness or left CVA tenderness.  Musculoskeletal:        General: No swelling or tenderness. Normal range of motion.     Cervical back: Neck supple.  Skin:    General: Skin is warm and dry.  Neurological:     General: No focal deficit present.     Mental Status: She is alert and oriented to person, place, and time. Mental status is at baseline.     Cranial Nerves: No cranial nerve deficit.      ED Course/ Medical Decision Making/ A&P    Procedures .Critical Care  Performed by: Onetha Bile, MD Authorized by: Onetha Bile, MD   Critical care provider statement:    Critical care time (minutes):  30   Critical care was necessary to treat or prevent imminent or life-threatening deterioration of the following conditions:  Metabolic crisis   Critical care was time spent personally by me on the following activities:  Development of treatment plan with patient or surrogate, discussions with consultants, evaluation of patient's response to treatment, examination of patient, ordering and review of laboratory studies, ordering and review of radiographic studies, ordering and performing treatments and interventions, pulse oximetry, re-evaluation of patient's condition and review of old charts    Medications Ordered in ED Medications  insulin aspart (novoLOG) injection 0-15 Units (has no administration in time range)  insulin aspart (novoLOG) injection 0-5 Units ( Subcutaneous Not Given 08/15/23 2136)  labetalol (NORMODYNE) injection 20 mg (has no administration in time range)  hydrALAZINE (APRESOLINE) injection 10 mg (has no administration in time range)  aspirin EC tablet 81 mg (81 mg Oral Given 08/15/23 2129)  pantoprazole (PROTONIX) EC tablet 40 mg (40 mg Oral Given 08/15/23 2129)  cefTRIAXone (ROCEPHIN) 1 g in sodium chloride 0.9 % 100 mL IVPB (has no administration in time range)  azithromycin  (ZITHROMAX) 500 mg in sodium chloride 0.9 % 250 mL IVPB (0 mg Intravenous Stopped 08/15/23 2332)  metroNIDAZOLE (FLAGYL) IVPB 500 mg (0 mg Intravenous Stopped 08/15/23 2329)  potassium chloride 10 mEq in 100 mL IVPB (10 mEq Intravenous New Bag/Given 08/15/23 2331)  acetaminophen (TYLENOL) tablet 650 mg (650 mg Oral Given 08/15/23 2214)    Or  acetaminophen (TYLENOL) suppository 650 mg ( Rectal See Alternative 08/15/23 2214)  polyethylene glycol (MIRALAX / GLYCOLAX) packet 17 g (has no administration in time range)  sodium chloride flush (NS) 0.9 % injection 3 mL (3 mLs Intravenous Given 08/15/23 2215)  lactated ringers infusion ( Intravenous New Bag/Given 08/15/23 2333)  potassium chloride SA (KLOR-CON M) CR tablet 40 mEq (40 mEq Oral Given 08/15/23 2316)  magnesium  sulfate IVPB 4 g 100 mL (has no administration in time range)  sodium chloride 0.9 % bolus 1,000 mL (0 mLs Intravenous Stopped 08/15/23 1830)  iohexol (OMNIPAQUE) 300 MG/ML solution 100 mL (100 mLs Intravenous Contrast Given 08/15/23 1701)  cefTRIAXone (ROCEPHIN) 1 g in sodium chloride 0.9 % 100 mL IVPB (0 g Intravenous Stopped 08/15/23 1835)    Medical Decision Making:   Casey Bryan is a 78 y.o. female who presented to the ED today with fever cough malaise detailed above.    Additional history discussed with patient's family/caregivers.  Patient placed on continuous vitals and telemetry monitoring while in ED which was reviewed periodically.  Complete initial physical exam performed, notably the patient  was hemodynamically stable no acute distress.    Reviewed and confirmed nursing documentation for past medical history, family history, social history.    Initial Assessment:   With the patient's presentation of fever cough malaise, most likely diagnosis is pneumonia. Other diagnoses were considered including (but not limited to) ACS, PE, viral syndrome, pneumothorax. These are considered less likely due to history of present illness and  physical exam findings.   This is most consistent with an acute life/limb threatening illness complicated by underlying chronic conditions.  Initial Plan:  Screening labs including CBC and Metabolic panel to evaluate for infectious or metabolic etiology of disease.  Urinalysis with reflex culture ordered to evaluate for UTI or relevant urologic/nephrologic pathology.  CXR to evaluate for structural/infectious intrathoracic pathology.  EKG to evaluate for cardiac pathology Objective evaluation as below reviewed   Initial Study Results:   Laboratory  Hyponatremia 115  EKG EKG was reviewed independently. Rate, rhythm, axis, intervals all examined and without medically relevant abnormality. ST segments without concerns for elevations.    Radiology:  All images reviewed independently. Agree with radiology report at this time.   CT ABDOMEN PELVIS W CONTRAST Result Date: 08/15/2023 CLINICAL DATA:  Abdominal pain. EXAM: CT ABDOMEN AND PELVIS WITH CONTRAST TECHNIQUE: Multidetector CT imaging of the abdomen and pelvis was performed using the standard protocol following bolus administration of intravenous contrast. RADIATION DOSE REDUCTION: This exam was performed according to the departmental dose-optimization program  which includes automated exposure control, adjustment of the mA and/or kV according to patient size and/or use of iterative reconstruction technique. CONTRAST:  100mL OMNIPAQUE IOHEXOL 300 MG/ML  SOLN COMPARISON:  CT abdomen pelvis dated 10/27/2022. FINDINGS: Lower chest: Patchy area of airspace opacity in the visualized left lower lobe concerning for pneumonia or aspiration. Clinical correlation and follow-up to resolution recommended. Trace pericardial effusion. No intra-abdominal free air or free fluid. Hepatobiliary: The liver is unremarkable. No biliary dilatation. The gallbladder is unremarkable. Pancreas: Unremarkable. No pancreatic ductal dilatation or surrounding inflammatory  changes. Spleen: Normal in size without focal abnormality. Adrenals/Urinary Tract: The adrenal glands are unremarkable. Right renal parapelvic cyst. There is no hydronephrosis on either side. There is symmetric enhancement and excretion of contrast by both kidneys. The visualized ureters and urinary bladder appear unremarkable. Stomach/Bowel: There is no bowel obstruction or active inflammation. The appendix is normal. Vascular/Lymphatic: Advanced aortoiliac atherosclerotic disease. The IVC is unremarkable. No portal venous gas. There is no adenopathy. Reproductive: The uterus is grossly unremarkable. No suspicious adnexal masses. Other: None Musculoskeletal: Osteopenia with degenerative changes of the spine. No acute osseous pathology. IMPRESSION: 1. No acute intra-abdominal or pelvic pathology. 2. Left lower lobe pneumonia or aspiration. 3.  Aortic Atherosclerosis (ICD10-I70.0). Electronically Signed   By: Angus Bark M.D.   On: 08/15/2023 17:32   DG Chest Portable 1 View Result Date: 08/15/2023 CLINICAL DATA:  Short of breath EXAM: PORTABLE CHEST 1 VIEW COMPARISON:  09/10/2003 FINDINGS: Single frontal view of the chest demonstrates mild enlargement the cardiac silhouette. There is increased pulmonary vascular congestion and interstitial prominence, compatible with fluid overload and interstitial edema. No acute airspace disease, effusion, or pneumothorax. No acute bony abnormalities. IMPRESSION: 1. Constellation of findings most consistent with mild congestive heart failure and developing interstitial edema. Electronically Signed   By: Bobbye Burrow M.D.   On: 08/15/2023 16:50      Consults: Case discussed with hospitalist.   Reassessment and Plan:   It appears the patient has a left lower lobe pneumonia.  CT abdomen pelvis added on due to localizing tenderness in her left side. No acute intra-abdominal pathology.  Started on Rocephin arrange for admission to medicine for further care management  and stabilization.  1 L IV fluid for management of hyponatremia. Disposition:   Based on the above findings, I believe this patient is stable for admission.    Patient/family educated about specific findings on our evaluation and explained exact reasons for admission.  Patient/family educated about clinical situation and time was allowed to answer questions.   Admission team communicated with and agreed with need for admission. Patient admitted. Patient ready to move at this time.     Emergency Department Medication Summary:   Medications  insulin aspart (novoLOG) injection 0-15 Units (has no administration in time range)  insulin aspart (novoLOG) injection 0-5 Units ( Subcutaneous Not Given 08/15/23 2136)  labetalol (NORMODYNE) injection 20 mg (has no administration in time range)  hydrALAZINE (APRESOLINE) injection 10 mg (has no administration in time range)  aspirin EC tablet 81 mg (81 mg Oral Given 08/15/23 2129)  pantoprazole (PROTONIX) EC tablet 40 mg (40 mg Oral Given 08/15/23 2129)  cefTRIAXone (ROCEPHIN) 1 g in sodium chloride 0.9 % 100 mL IVPB (has no administration in time range)  azithromycin (ZITHROMAX) 500 mg in sodium chloride 0.9 % 250 mL IVPB (0 mg Intravenous Stopped 08/15/23 2332)  metroNIDAZOLE (FLAGYL) IVPB 500 mg (0 mg Intravenous Stopped 08/15/23 2329)  potassium chloride 10 mEq  in 100 mL IVPB (10 mEq Intravenous New Bag/Given 08/15/23 2331)  acetaminophen (TYLENOL) tablet 650 mg (650 mg Oral Given 08/15/23 2214)    Or  acetaminophen (TYLENOL) suppository 650 mg ( Rectal See Alternative 08/15/23 2214)  polyethylene glycol (MIRALAX / GLYCOLAX) packet 17 g (has no administration in time range)  sodium chloride flush (NS) 0.9 % injection 3 mL (3 mLs Intravenous Given 08/15/23 2215)  lactated ringers infusion ( Intravenous New Bag/Given 08/15/23 2333)  potassium chloride SA (KLOR-CON M) CR tablet 40 mEq (40 mEq Oral Given 08/15/23 2316)  magnesium  sulfate IVPB 4 g 100 mL (has no  administration in time range)  sodium chloride 0.9 % bolus 1,000 mL (0 mLs Intravenous Stopped 08/15/23 1830)  iohexol (OMNIPAQUE) 300 MG/ML solution 100 mL (100 mLs Intravenous Contrast Given 08/15/23 1701)  cefTRIAXone (ROCEPHIN) 1 g in sodium chloride 0.9 % 100 mL IVPB (0 g Intravenous Stopped 08/15/23 1835)         Clinical Impression: No diagnosis found.   Admit   Final Clinical Impression(s) / ED Diagnoses Final diagnoses:  None    Rx / DC Orders ED Discharge Orders     None         Onetha Bile, MD 08/15/23 2337

## 2023-08-15 NOTE — Assessment & Plan Note (Signed)
 Hold atorvastatin , til we have ck back

## 2023-08-15 NOTE — H&P (Addendum)
 History and Physical    Patient: Casey Bryan:[id number] DOB: 15-Aug-1945 DOA: 08/15/2023 DOS: the patient was seen and examined on 08/15/2023 PCP: Casey Hollow, MD  Patient coming from: Home  Chief Complaint:  Chief Complaint  Patient presents with   Abnormal Lab    sodium   HPI: hisotry from patient and daughter and niece at bedsdie. Patient says she understand english and declined interpretor services.   Casey Bryan is a 78 y.o. female with medical history significant of medical issues as listed below.  Patient seems to have been in her usual state of health till approximately 4 days ago when she had a new onset of "frequent micturition.  The following day patient was seen at an urgent care facility and diagnosed with urinary tract infection and treated with Keflex.  Patient's frequent micturition has since then resolved.  However since 2 days ago patient has had loose bowel movements, it is hard to ascertain exactly how many a day but seems to be at least twice a day.  With associated at least 1 episode of vomiting, abdominal discomfort epigastric versus left lumbar area as well as subjective fevers.  Patient has had extremely poor appetite and has had progressive fatigue develop.  Patient was evaluated by primary care provider today and had blood work done which revealed a mild hyponatremia prompting the patient to be sent to the ER.  During evaluation in the ER, patient was still complaining of abdominal pain, CT abdomen, report below revealed left lower lung field pneumonia.  Patient is s/p ceftriaxone in the ER.  Further patient's a severe hyponatremia was corroborated, patient is s/p 1 L normal saline.  Patient is transferred to the stepdown unit at [facility] and is still reporting abdominal discomfort.  Medical evaluation is sought.  Review of Systems: As mentioned in the history of present illness. All other systems reviewed and are negative. Past Medical History:  Diagnosis  Date   Amaurosis fugax 12/12/2015   Diabetes mellitus    GERD (gastroesophageal reflux disease)    Glaucoma 04-2012   Hyperlipemia    Hypertension    Past Surgical History:  Procedure Laterality Date   NO PAST SURGERIES     Social History:  reports that she has never smoked. She has never used smokeless tobacco. She reports current alcohol use. She reports that she does not use drugs.  Allergies  Allergen Reactions   Prevnar [Pneumococcal 13-Val Conj Vacc] Nausea And Vomiting and Other (See Comments)    Fever of 102.    Clarithromycin  [Clarithromycin ]     Contradictions w/Vytorin    Penicillins    Tetanus-Diphtheria Toxoids Td     REACTION: see OV note  02-11-10    Family History  Problem Relation Age of Onset   Diabetes Mother        uncles, mother , father    Coronary artery disease Other        F age 66, B age 19, uncle age 1 : MI   Heart attack Brother 75   Breast cancer Sister    Multiple myeloma Sister    Colon cancer Neg Hx     Prior to Admission medications   Medication Sig Start Date End Date Taking? Authorizing Provider  aspirin EC 81 MG tablet Take 81 mg by mouth daily.    [provider]  atorvastatin  (LIPITOR) 80 MG tablet Take 1 tablet (80 mg total) by mouth daily. 06/24/23   Casey Hollow, MD  Blood Glucose Monitoring Suppl (ADVOCATE REDI-CODE) DEVI by Does not apply route. Reported on 04/22/2015    [provider]  Calcium  Carbonate-Vit D-Min (CALCIUM  1200 PO) Take 1,000 mg by mouth.    [provider]  cephALEXin (KEFLEX) 500 MG capsule Take 500 mg by mouth 4 (four) times daily. 08/12/23 08/19/23  [provider]  chlorthalidone  (HYGROTON ) 25 MG tablet Take 1 tablet (25 mg total) by mouth daily. 06/24/23   Casey Hollow, MD  esomeprazole  (NEXIUM ) 40 MG capsule Take 1 capsule (40 mg total) by mouth 2 (two) times daily before a meal. 06/24/23   Paz, Casey Ket, MD  glucose blood [facility] ULTRA) test strip USE TO CHECK BLOOD SUGARS ONCE  DAILY 10/20/22   Paz, Casey E, MD  irbesartan  (AVAPRO ) 300 MG tablet Take 1 tablet (300 mg total) by mouth daily. 06/24/23   Paz, Casey E, MD  Lancets [facility] DELICA PLUS [place]) MISC USE TO CHECK BLOOD SUGARS ONCE DAILY 10/20/22   Paz, Casey E, MD  lidocaine  (XYLOCAINE ) 5 % ointment Apply 1 application topically 2 (two) times daily as needed.     [provider]  metFORMIN  (GLUCOPHAGE ) 1000 MG tablet Take 1 tablet (1,000 mg total) by mouth 2 (two) times daily with a meal. 06/24/23   Casey Hollow, MD  metoprolol  tartrate (LOPRESSOR ) 50 MG tablet Take 2 tablets (100 mg total) by mouth 2 (two) times daily. 06/24/23   Paz, Casey E, MD  Multiple Vitamin (MULTIVITAMIN) tablet Take 1 tablet by mouth daily.    [provider]  Multiple Vitamins-Minerals (EYE VITAMINS) CAPS Take 1 capsule by mouth daily.    [provider]  nitrofurantoin, macrocrystal-monohydrate, (MACROBID) 100 MG capsule Take 1 capsule (100 mg total) by mouth 2 (two) times daily. 08/15/23   Casey Balk, MD  ondansetron  (ZOFRAN ) 4 MG tablet Take 1 tablet (4 mg total) by mouth every 8 (eight) hours as needed for nausea or vomiting. 08/15/23   Casey Balk, MD    Physical Exam: Vitals:   08/15/23 1613 08/15/23 1630 08/15/23 2014 08/15/23 2100  BP:  139/72 (!) 136/101 (!) 144/123  Pulse: 85 80 (!) 101 (!) 114  Resp:   18 16  Temp:   99 F (37.2 C)   TempSrc:   Oral   SpO2: 98% 97% 100% 94%  Weight:   76.1 kg    General: Patient is alert and awake mild painful discomfort otherwise appears to be in no distress. Respiratory exam: Left basilar crackles.  Clearly Cardiovascular exam S1-S2 normal Abdomen all quadrants are soft nontender bowel sounds are hyperactive, Extremities warm no edema. Data Reviewed:  Labs on Admission:  Results for orders placed or performed during the hospital encounter of 08/15/23 (from the past 24 hours)  Comprehensive metabolic panel     Status: Abnormal   Collection Time:  08/15/23  3:38 PM  Result Value Ref Range   Sodium 115 (LL) 135 - 145 mmol/L   Potassium 2.9 (L) 3.5 - 5.1 mmol/L   Chloride 76 (L) 98 - 111 mmol/L   CO2 25 22 - 32 mmol/L   Glucose, Bld 156 (H) 70 - 99 mg/dL   BUN 16 8 - 23 mg/dL   Creatinine, Ser 1.61 (H) 0.44 - 1.00 mg/dL   Calcium  9.0 8.9 - 10.3 mg/dL   Total Protein 7.0 6.5 - 8.1 g/dL   Albumin 4.0 3.5 - 5.0 g/dL   AST 51 (H) 15 - 41 U/L  ALT 28 0 - 44 U/L   Alkaline Phosphatase 61 38 - 126 U/L   Total Bilirubin 0.8 0.0 - 1.2 mg/dL   GFR, Estimated 45 (L) >60 mL/min   Anion gap 14 5 - 15  CBC with Differential     Status: Abnormal   Collection Time: 08/15/23  3:38 PM  Result Value Ref Range   WBC 9.3 4.0 - 10.5 K/uL   RBC 3.76 (L) 3.87 - 5.11 MIL/uL   Hemoglobin 10.8 (L) 12.0 - 15.0 g/dL   HCT 16.1 (L) 09.6 - 04.5 %   MCV 79.8 (L) 80.0 - 100.0 fL   MCH 28.7 26.0 - 34.0 pg   MCHC 36.0 30.0 - 36.0 g/dL   RDW 40.9 81.1 - 91.4 %   Platelets 192 150 - 400 K/uL   nRBC 0.0 0.0 - 0.2 %   Neutrophils Relative % 89 %   Neutro Abs 8.3 (H) 1.7 - 7.7 K/uL   Lymphocytes Relative 5 %   Lymphs Abs 0.4 (L) 0.7 - 4.0 K/uL   Monocytes Relative 5 %   Monocytes Absolute 0.5 0.1 - 1.0 K/uL   Eosinophils Relative 0 %   Eosinophils Absolute 0.0 0.0 - 0.5 K/uL   Basophils Relative 0 %   Basophils Absolute 0.0 0.0 - 0.1 K/uL   Immature Granulocytes 1 %   Abs Immature Granulocytes 0.05 0.00 - 0.07 K/uL  Urinalysis, Routine w reflex microscopic -Urine, Clean Catch     Status: Abnormal   Collection Time: 08/15/23  3:38 PM  Result Value Ref Range   Color, Urine YELLOW YELLOW   APPearance CLEAR CLEAR   Specific Gravity, Urine 1.019 1.005 - 1.030   pH 6.5 5.0 - 8.0   Glucose, UA NEGATIVE NEGATIVE mg/dL   Hgb urine dipstick TRACE (A) NEGATIVE   Bilirubin Urine NEGATIVE NEGATIVE   Ketones, ur NEGATIVE NEGATIVE mg/dL   Protein, ur 30 (A) NEGATIVE mg/dL   Nitrite NEGATIVE NEGATIVE   Leukocytes,Ua NEGATIVE NEGATIVE   RBC / HPF 0-5 0 - 5  RBC/hpf   WBC, UA 0-5 0 - 5 WBC/hpf   Bacteria, UA RARE (A) NONE SEEN   Squamous Epithelial / HPF 0-5 0 - 5 /HPF  Resp panel by RT-PCR (RSV, Flu A&B, Covid) Anterior Nasal Swab     Status: None   Collection Time: 08/15/23  4:03 PM   Specimen: Anterior Nasal Swab  Result Value Ref Range   SARS Coronavirus 2 by RT PCR NEGATIVE NEGATIVE   Influenza A by PCR NEGATIVE NEGATIVE   Influenza B by PCR NEGATIVE NEGATIVE   Resp Syncytial Virus by PCR NEGATIVE NEGATIVE   Basic Metabolic Panel: Recent Labs  Lab 08/15/23 1349 08/15/23 1538  NA 115* 115*  K 3.2* 2.9*  CL 77* 76*  CO2 27 25  GLUCOSE 135* 156*  BUN 16 16  CREATININE 1.00 1.24*  CALCIUM  8.2* 9.0   Liver Function Tests: Recent Labs  Lab 08/15/23 1349 08/15/23 1538  AST 35 51*  ALT 19 28  ALKPHOS 44 61  BILITOT 0.9 0.8  PROT 6.3 7.0  ALBUMIN 3.5 4.0   No results for input(s): "LIPASE", "AMYLASE" in the last 168 hours. No results for input(s): "AMMONIA" in the last 168 hours. CBC: Recent Labs  Lab 08/15/23 1349 08/15/23 1538  WBC 9.7 9.3  NEUTROABS 8.7* 8.3*  HGB 11.0* 10.8*  HCT 31.3* 30.0*  MCV 83.2 79.8*  PLT 203.0 192   Cardiac Enzymes: No results for input(s): "CKTOTAL", "  CKMB", "CKMBINDEX", "TROPONINIHS" in the last 168 hours.  BNP (last 3 results) No results for input(s): "PROBNP" in the last 8760 hours. CBG: No results for input(s): "GLUCAP" in the last 168 hours.  Radiological Exams on Admission:  CT ABDOMEN PELVIS W CONTRAST Result Date: 08/15/2023 CLINICAL DATA:  Abdominal pain. EXAM: CT ABDOMEN AND PELVIS WITH CONTRAST TECHNIQUE: Multidetector CT imaging of the abdomen and pelvis was performed using the standard protocol following bolus administration of intravenous contrast. RADIATION DOSE REDUCTION: This exam was performed according to the departmental dose-optimization program which includes automated exposure control, adjustment of the mA and/or kV according to patient size and/or use of  iterative reconstruction technique. CONTRAST:  100mL OMNIPAQUE IOHEXOL 300 MG/ML  SOLN COMPARISON:  CT abdomen pelvis dated 10/27/2022. FINDINGS: Lower chest: Patchy area of airspace opacity in the visualized left lower lobe concerning for pneumonia or aspiration. Clinical correlation and follow-up to resolution recommended. Trace pericardial effusion. No intra-abdominal free air or free fluid. Hepatobiliary: The liver is unremarkable. No biliary dilatation. The gallbladder is unremarkable. Pancreas: Unremarkable. No pancreatic ductal dilatation or surrounding inflammatory changes. Spleen: Normal in size without focal abnormality. Adrenals/Urinary Tract: The adrenal glands are unremarkable. Right renal parapelvic cyst. There is no hydronephrosis on either side. There is symmetric enhancement and excretion of contrast by both kidneys. The visualized ureters and urinary bladder appear unremarkable. Stomach/Bowel: There is no bowel obstruction or active inflammation. The appendix is normal. Vascular/Lymphatic: Advanced aortoiliac atherosclerotic disease. The IVC is unremarkable. No portal venous gas. There is no adenopathy. Reproductive: The uterus is grossly unremarkable. No suspicious adnexal masses. Other: None Musculoskeletal: Osteopenia with degenerative changes of the spine. No acute osseous pathology. IMPRESSION: 1. No acute intra-abdominal or pelvic pathology. 2. Left lower lobe pneumonia or aspiration. 3.  Aortic Atherosclerosis (ICD10-I70.0). Electronically Signed   By: Angus Bark M.D.   On: 08/15/2023 17:32   DG Chest Portable 1 View Result Date: 08/15/2023 CLINICAL DATA:  Short of breath EXAM: PORTABLE CHEST 1 VIEW COMPARISON:  09/10/2003 FINDINGS: Single frontal view of the chest demonstrates mild enlargement the cardiac silhouette. There is increased pulmonary vascular congestion and interstitial prominence, compatible with fluid overload and interstitial edema. No acute airspace disease,  effusion, or pneumothorax. No acute bony abnormalities. IMPRESSION: 1. Constellation of findings most consistent with mild congestive heart failure and developing interstitial edema. Electronically Signed   By: Bobbye Burrow M.D.   On: 08/15/2023 16:50    chest X-ray  EKG: Independently reviewed. Patient seems to have MAT/AFIB  I/O last 3 completed shifts: In: 1000 [IV Piggyback:1000] Out: -  Total I/O In: 33.3 [IV Piggyback:33.3] Out: 200 [Urine:200]    Assessment and Plan: * Hyponatremia This presumed chronic, severe and without associated severe symptoms.  At this time patient's urine sodium is pending.  However my presumption is that this is prerenal given the presentation as mentioned above.  We will monitor patient's basic metabolic panel frequently and target about 6 to 8 mEq correction in the next 24 hours. Which would include correction of potasssium. Will allow diet with fluid restriction.  However, in case of any concern of hypovolemia or hypotension, will give fluid boluses as needed.  Check cortisol tSH.  A-fib (HCC) , Rate controlled.  Check magnesium , correct potassium.  Check TSH and echo in the morning I sent a message to Reno Cash to kindly assign the cardiology service in the morning.  Gastroenteritis Seems to have developed in the last 48 hours following outpatient treatment with Keflex.  Will check C. difficile.  Pneumonia This really is incidentally discovered as patient did not report any respiratory symptoms.  Although there was some crackles on the left base.  Given history of vomiting, we had to consider aspiration as well.  Empiric treatment has been started with ceftriaxone plus azithromycin plus metronidazole.  AKI (acute kidney injury) (HCC) Patient baseline creatinine seems to be normal based on review of prior labs.  Currently is 1.24.  Given finding of severe hyponatremia, reported vomiting and diarrhea and poor p.o. intake, most consistent  with a prerenal state.  Patient already received a liter of saline bolus in the ER.  Urinalysis showing hemoglobin positive by dipstick but no red blood cells in the microscopic exam.  There is some proteinuria as well.  Concern for ATN.  I will check a CK level.  GERD Omeprazole  changed to pantop  Essential hypertension Hold chlorthalidone , avapro  and lopressor  . Use PRN labetalol and hydraalazine tonight.  Hyperlipidemia Hold atorvastatin , til we have ck back  DMII (diabetes mellitus, type 2) (HCC) Hold metformin . Rx with insulin sliding scale.   DVT ppx - SCD. Poatinet has chronic hemorrhoids and tends to bleed from same. No activebleeding.    Advance Care Planning:   Code Status: Not on file full cdoe.  Consults: sent a message to cardiology as above.  Family Communication: daughter at bedside. All questions answered.  Severity of Illness: The appropriate patient status for this patient is INPATIENT. Inpatient status is judged to be reasonable and necessary in order to provide the required intensity of service to ensure the patient's safety. The patient's presenting symptoms, physical exam findings, and initial radiographic and laboratory data in the context of their chronic comorbidities is felt to place them at high risk for further clinical deterioration. Furthermore, it is not anticipated that the patient will be medically stable for discharge from the hospital within 2 midnights of admission.   * I certify that at the point of admission it is my clinical judgment that the patient will require inpatient hospital care spanning beyond 2 midnights from the point of admission due to high intensity of service, high risk for further deterioration and high frequency of surveillance required.*  Author: Bennie Brave, MD 08/15/2023 9:21 PM  For on call review [email or web address].

## 2023-08-15 NOTE — Assessment & Plan Note (Signed)
 Seems to have developed in the last 48 hours following outpatient treatment with Keflex.  Will check C. difficile.

## 2023-08-15 NOTE — Assessment & Plan Note (Signed)
Omeprazole changed to pantop

## 2023-08-15 NOTE — ED Notes (Signed)
 Clydie Braun with [facility] called for transport

## 2023-08-15 NOTE — Telephone Encounter (Signed)
 CRITICAL VALUE STICKER  CRITICAL VALUE:  Sodium 115.3  MESSENGER (representative from lab): Hope

## 2023-08-15 NOTE — Progress Notes (Signed)
 Subjective:    Patient ID: Casey Bryan, female    DOB: 10-03-1945, 78 y.o.   MRN: [id number]  Chief Complaint  Patient presents with   Acute Visit    Patient presents today for fatigue, abdominal pain and headaches.     HPI Discussed the use of AI scribe software for clinical note transcription with the patient, who gave verbal consent to proceed.  History of Present Illness Casey Bryan is a 78 year old female with diabetes, hypertension, and hyperlipidemia who presents with fatigue and urinary symptoms.  She experiences significant fatigue and urinary symptoms that began acutely on August 12, 2023, including frequent urination, urgency, and occasional incontinence. She also has a low-grade fever, headache, and a bitter taste in her mouth. A urine culture performed at [facility] urgent care showed no uropathogens.  On August 14, 2023, she developed abdominal pain, accompanied by one episode of vomiting and diarrhea. She describes ongoing nausea and a lack of appetite, having eaten very little, including yogurt and hummus. She has been experiencing significant fatigue, stating she 'just wants to sleep'.  Her past medical history includes diabetes, hypertension, hyperlipidemia, mild anemia, and low vitamin D  levels. She is allergic to penicillin. Her daughter monitors her blood sugar, which was 120 mg/dL yesterday. She has not taken her diabetes medication today due to poor oral intake.  She has been hydrating with water, consuming about five 8-ounce bottles yesterday. No respiratory symptoms, ear pain, or current headache. She denies any history of migraines. She reports bilateral leg pain, described as myalgias, which she attributes to illness and dehydration.  Her daughter mentions that she has been following a BRAT diet and has been taking probiotics. She has not been out much recently, and her daughter is concerned about her current condition.    Past Medical History:  Diagnosis  Date   Amaurosis fugax 12/12/2015   Diabetes mellitus    GERD (gastroesophageal reflux disease)    Glaucoma 04-2012   Hyperlipemia    Hypertension     Past Surgical History:  Procedure Laterality Date   NO PAST SURGERIES      Family History  Problem Relation Age of Onset   Diabetes Mother        uncles, mother , father    Coronary artery disease Other        F age 53, B age 21, uncle age 64 : MI   Heart attack Brother 1   Breast cancer Sister    Multiple myeloma Sister    Colon cancer Neg Hx     Social History   Socioeconomic History   Marital status: Widowed    Spouse name: Not on file   Number of children: 3   Years of education: Not on file   Highest education level: Associate degree: academic program  Occupational History   Occupation: semi-retired,own a busines, Airline pilot  Tobacco Use   Smoking status: Never   Smokeless tobacco: Never  Substance and Sexual Activity   Alcohol use: Yes    Comment: socially   Drug use: No   Sexual activity: Not on file  Other Topics Concern   Not on file  Social History Narrative   Lost her husband in early 2025   Lives by herself but a niece is w/ her for now  (as off 06/2023)   Original [place]    Social Drivers of Health   Financial Resource Strain: Low Risk  (06/23/2023)   Overall Financial Resource  Strain (CARDIA)    Difficulty of Paying Living Expenses: Not hard at all  Food Insecurity: No Food Insecurity (06/23/2023)   Hunger Vital Sign    Worried About Running Out of Food in the Last Year: Never true    Ran Out of Food in the Last Year: Never true  Transportation Needs: No Transportation Needs (06/23/2023)   PRAPARE - Administrator, Civil Service (Medical): No    Lack of Transportation (Non-Medical): No  Physical Activity: Insufficiently Active (06/23/2023)   Exercise Vital Sign    Days of Exercise per Week: 2 days    Minutes of Exercise per Session: 20 min  Stress: No Stress Concern Present  (06/23/2023)   [employer] of Occupational Health - Occupational Stress Questionnaire    Feeling of Stress : Not at all  Social Connections: Moderately Integrated (06/23/2023)   Social Connection and Isolation Panel [NHANES]    Frequency of Communication with Friends and Family: More than three times a week    Frequency of Social Gatherings with Friends and Family: More than three times a week    Attends Religious Services: More than 4 times per year    Active Member of [employer] or Organizations: Yes    Attends Banker Meetings: More than 4 times per year    Marital Status: Widowed  Intimate Partner Violence: Not on file    No facility-administered medications prior to visit.   Outpatient Medications Prior to Visit  Medication Sig Dispense Refill   aspirin EC 81 MG tablet Take 81 mg by mouth daily.     atorvastatin  (LIPITOR) 80 MG tablet Take 1 tablet (80 mg total) by mouth daily. 90 tablet 1   Blood Glucose Monitoring Suppl (ADVOCATE REDI-CODE) DEVI by Does not apply route. Reported on 04/22/2015     Calcium  Carbonate-Vit D-Min (CALCIUM  1200 PO) Take 1,000 mg by mouth.     cephALEXin (KEFLEX) 500 MG capsule Take 500 mg by mouth 4 (four) times daily.     chlorthalidone  (HYGROTON ) 25 MG tablet Take 1 tablet (25 mg total) by mouth daily. 90 tablet 1   esomeprazole  (NEXIUM ) 40 MG capsule Take 1 capsule (40 mg total) by mouth 2 (two) times daily before a meal. 180 capsule 1   glucose blood (ONETOUCH ULTRA) test strip USE TO CHECK BLOOD SUGARS ONCE DAILY 100 strip 12   irbesartan  (AVAPRO ) 300 MG tablet Take 1 tablet (300 mg total) by mouth daily. 90 tablet 1   Lancets (ONETOUCH DELICA PLUS LANCET33G) MISC USE TO CHECK BLOOD SUGARS ONCE DAILY 100 each 12   lidocaine  (XYLOCAINE ) 5 % ointment Apply 1 application topically 2 (two) times daily as needed.      metFORMIN  (GLUCOPHAGE ) 1000 MG tablet Take 1 tablet (1,000 mg total) by mouth 2 (two) times daily with a meal. 180 tablet 1    metoprolol  tartrate (LOPRESSOR ) 50 MG tablet Take 2 tablets (100 mg total) by mouth 2 (two) times daily. 360 tablet 1   Multiple Vitamin (MULTIVITAMIN) tablet Take 1 tablet by mouth daily.     Multiple Vitamins-Minerals (EYE VITAMINS) CAPS Take 1 capsule by mouth daily.      Allergies  Allergen Reactions   Prevnar [Pneumococcal 13-Val Conj Vacc] Nausea And Vomiting and Other (See Comments)    Fever of 102.    Clarithromycin  [Clarithromycin ]     Contradictions w/Vytorin    Penicillins    Tetanus-Diphtheria Toxoids Td     REACTION: see OV note  02-11-10  Review of Systems  Constitutional:  Positive for fever and malaise/fatigue.  HENT:  Negative for congestion.   Eyes:  Negative for blurred vision.  Respiratory:  Negative for shortness of breath.   Cardiovascular:  Negative for chest pain, palpitations and leg swelling.  Gastrointestinal:  Positive for abdominal pain, diarrhea, nausea and vomiting. Negative for blood in stool.  Genitourinary:  Positive for frequency and urgency. Negative for dysuria, flank pain and hematuria.  Musculoskeletal:  Negative for falls and myalgias.  Skin:  Negative for rash.  Neurological:  Negative for dizziness, loss of consciousness and headaches.  Endo/Heme/Allergies:  Negative for environmental allergies.  Psychiatric/Behavioral:  Negative for depression. The patient is not nervous/anxious.        Objective:    Physical Exam Constitutional:      General: She is not in acute distress.    Appearance: Normal appearance. She is well-developed. She is not toxic-appearing.  HENT:     Head: Normocephalic and atraumatic.     Right Ear: External ear normal.     Left Ear: External ear normal.     Nose: Nose normal.  Eyes:     General:        Right eye: No discharge.        Left eye: No discharge.     Conjunctiva/sclera: Conjunctivae normal.  Neck:     Thyroid : No thyromegaly.  Cardiovascular:     Rate and Rhythm: Normal rate and regular  rhythm.     Heart sounds: Normal heart sounds. No murmur heard. Pulmonary:     Effort: Pulmonary effort is normal. No respiratory distress.     Breath sounds: Normal breath sounds.  Abdominal:     General: Bowel sounds are normal.     Palpations: Abdomen is soft.     Tenderness: There is no abdominal tenderness. There is no guarding.  Musculoskeletal:        General: Normal range of motion.     Cervical back: Neck supple.  Lymphadenopathy:     Cervical: No cervical adenopathy.  Skin:    General: Skin is warm and dry.  Neurological:     Mental Status: She is alert and oriented to person, place, and time.  Psychiatric:        Mood and Affect: Mood normal.        Behavior: Behavior normal.        Thought Content: Thought content normal.        Judgment: Judgment normal.     BP 124/66   Pulse 88   Temp 98.3 F (36.8 C)   Ht 5\' 4"  (1.626 m)   Wt 171 lb 9.6 oz (77.8 kg)   SpO2 95%   BMI 29.46 kg/m  Wt Readings from Last 3 Encounters:  08/15/23 167 lb 12.3 oz (76.1 kg)  08/15/23 171 lb 9.6 oz (77.8 kg)  06/24/23 173 lb 8 oz (78.7 kg)    Diabetic Foot Exam - Simple   No data filed    Lab Results  Component Value Date   WBC 9.3 08/15/2023   HGB 10.8 (L) 08/15/2023   HCT 30.0 (L) 08/15/2023   PLT 192 08/15/2023   GLUCOSE 156 (H) 08/15/2023   CHOL 129 06/24/2023   TRIG 73 06/24/2023   HDL 53 06/24/2023   LDLCALC 61 06/24/2023   ALT 28 08/15/2023   AST 51 (H) 08/15/2023   NA 115 (LL) 08/15/2023   K 2.9 (L) 08/15/2023   CL 76 (L) 08/15/2023  CREATININE 1.24 (H) 08/15/2023   BUN 16 08/15/2023   CO2 25 08/15/2023   TSH 0.85 06/24/2023   HGBA1C 6.0 (H) 06/24/2023   MICROALBUR 9.9 (H) 02/21/2023    Lab Results  Component Value Date   TSH 0.85 06/24/2023   Lab Results  Component Value Date   WBC 9.3 08/15/2023   HGB 10.8 (L) 08/15/2023   HCT 30.0 (L) 08/15/2023   MCV 79.8 (L) 08/15/2023   PLT 192 08/15/2023   Lab Results  Component Value Date   NA 115  (LL) 08/15/2023   K 2.9 (L) 08/15/2023   CO2 25 08/15/2023   GLUCOSE 156 (H) 08/15/2023   BUN 16 08/15/2023   CREATININE 1.24 (H) 08/15/2023   BILITOT 0.8 08/15/2023   ALKPHOS 61 08/15/2023   AST 51 (H) 08/15/2023   ALT 28 08/15/2023   PROT 7.0 08/15/2023   ALBUMIN 4.0 08/15/2023   CALCIUM  9.0 08/15/2023   ANIONGAP 14 08/15/2023   GFR 54.57 (L) 08/15/2023   Lab Results  Component Value Date   CHOL 129 06/24/2023   Lab Results  Component Value Date   HDL 53 06/24/2023   Lab Results  Component Value Date   LDLCALC 61 06/24/2023   Lab Results  Component Value Date   TRIG 73 06/24/2023   Lab Results  Component Value Date   CHOLHDL 2.4 06/24/2023   Lab Results  Component Value Date   HGBA1C 6.0 (H) 06/24/2023       Assessment & Plan:  Abdominal pain, unspecified abdominal location -     CBC with Differential/Platelet -     Comprehensive metabolic panel with GFR -     Sedimentation rate -     Urinalysis, Routine w reflex microscopic -     Urine Culture  Nausea and vomiting, unspecified vomiting type  Urinary frequency -     Urinalysis, Routine w reflex microscopic  Other orders -     Ondansetron  HCl; Take 1 tablet (4 mg total) by mouth every 8 (eight) hours as needed for nausea or vomiting.  Dispense: 20 tablet; Refill: 0 -     Nitrofurantoin Monohyd Macro; Take 1 capsule (100 mg total) by mouth 2 (two) times daily.  Dispense: 10 capsule; Refill: 0    Assessment and Plan Assessment & Plan Dehydration Signs of dehydration due to recent vomiting, diarrhea, and low fluid intake. Increased risk of fatigue and hospitalization if unmanaged. - Encourage fluid intake to 80 ounces per day using water, Gatorade, Sprite, or broth. - Avoid caffeine and milk.  Abdominal pain Abdominal pain possibly related to viral gastroenteritis. No evidence of diverticulitis or other specific pathology. - Continue BRAT diet with bland proteins like chicken breast and non-spicy  hummus.  Nausea and vomiting Nausea and vomiting likely due to gastrointestinal disturbance. Ondansetron  prescribed to manage symptoms. - Prescribe ondansetron  for nausea. - Use ginger tea or chews to help settle the stomach.  Diarrhea Diarrhea likely related to gastrointestinal disturbance, possibly viral. Symptoms mild and self-limiting. - Continue BRAT diet.  Diabetes Mellitus Type 2 Diabetes increases infection risk and complicates recovery. Blood sugar levels well-managed, recent readings around 120 mg/dL. - Monitor blood sugar levels closely. - Adjust diabetes medications as needed based on food intake and blood sugar levels.  Fatigue Fatigue likely multifactorial, related to illness, dehydration, and emotional stress. Diabetes may contribute. - Encourage rest and adequate hydration.  Urinary symptoms without infection Urinary frequency and urgency without infection. Possible transient irritation or early infection not  detected. - Discontinue Keflex as urine culture was negative. - Consider cranberry tablets to prevent urinary tract infections. - Prescribe nitrofurantoin (Macrobid) on hold for potential use if urinary symptoms worsen.     Randie Bustle, MD

## 2023-08-15 NOTE — Assessment & Plan Note (Signed)
 Hold metformin . Rx with insulin sliding scale.

## 2023-08-15 NOTE — Telephone Encounter (Signed)
 Initial Comment Caller states her mom went to urgent care and has a UTI. Was prescribed antibiotic and today she vomited and headache. No blood in vomit and urinating normal. Diarrhea also. Translation No Nurse Assessment Nurse: Angus Kenning, RN, Mariah Shines Date/Time [facility] Time): 08/14/2023 6:27:10 PM Confirm and document reason for call. If symptomatic, describe symptoms. ---Caller states her mother has a UTI, is on abx. Today she has a bitter taste in her mouth, fatigue, nausea, vomiting, abd pain, HA and diarrhea. She has had four doses. Nausea started the first day, vomiting started today. Afebrile. Denies severe pain. BS normal. Does the patient have any new or worsening symptoms? ---Yes Will a triage be completed? ---Yes Related visit to physician within the last 2 weeks? ---Yes Does the PT have any chronic conditions? (i.e. diabetes, asthma, this includes High risk factors for pregnancy, etc.) ---Yes List chronic conditions. ---HTN, DM, high cholesterol. Is this a behavioral health or substance abuse call? ---No Guidelines Guideline Title Affirmed Question Affirmed Notes Nurse Date/Time (Eastern Time) Urinary Tract Infection on Antibiotic Follow-up Call - Female Diabetes mellitus or weak immune system (e.g., HIV positive, cancer chemo, splenectomy, Angus Kenning, RN, Mariah Shines 08/14/2023 6:30:55 PM PLEASE NOTE: All timestamps contained within this report are represented as [place] Standard Time. CONFIDENTIALTY NOTICE: This fax transmission is intended only for the addressee. It contains information that is legally privileged, confidential or otherwise protected from use or disclosure. If you are not the intended recipient, you are strictly prohibited from reviewing, disclosing, copying using or disseminating any of this information or taking any action in reliance on or regarding this information. If you have received this fax in error, please notify us  immediately by telephone so that we  can arrange for its return to us . Phone: [phone], Toll-Free: [phone], Fax: [phone] [facility] Feb 06, 1946 Page: 2 of 2 CallId: [id number] Guidelines Guideline Title Affirmed Question Affirmed Notes Nurse Date/Time [facility] Time) organ transplant, chronic steroids) Disp. Time [facility] Time) Disposition Final User 08/14/2023 6:35:16 PM SEE PCP WITHIN 3 DAYS Yes Angus Kenning RN, Mariah Shines Final Disposition 08/14/2023 6:35:16 PM SEE PCP WITHIN 3 DAYS Yes Angus Kenning, RN, Angelene Barbone Disagree/Comply Comply Caller Understands Yes PreDisposition InappropriateToAsk Care Advice Given Per Guideline SEE PCP WITHIN 3 DAYS: * You need to be seen within 2 or 3 days. DRINK EXTRA FLUIDS: * Drink extra fluids. CALL BACK IF: * Fever occurs * Pain does not improve by day 4 on antibiotics * Urine symptoms do not improve by day 4 on antibiotics * You become worse CARE ADVICE given per Urinary Tract Infection on Antibiotic Follow-up Call - Female (Adult) guideline. Comments User: Harrold Lincoln, RN Date/Time [facility] Time): 08/14/2023 6:32:16 PM Vomited once and diarrhea once, too. User: Harrold Lincoln, RN Date/Time [facility] Time): 08/14/2023 6:39:27 PM Caller asking about changing abx. She said she took her abx at 0600, didn't vomit until 1400. Explained that it likely isn't related and it won't help to change abx. Instructed her to encourage her mother to sip slowly on fluids to stay hydrated and eat things like crackers, graham crackers as tolerated with the abx. Instructed her that yogurt is great if she can eat that. She asked if she shouldn't be better by now. Instructed her that, no, she shouldn't be better by now. Often, first 24 hours there is no improvement and sometimes on the second there isn't a lot but fever should be gone and by the third day she should see some improvement. She verbalized understanding

## 2023-08-15 NOTE — ED Triage Notes (Signed)
 Pt advises NVD x 1 day, recent UTI. Seen for same at PCP today, labs drawn & pt called w critical sodium- 115. Son also advises poor PO, fatigue.

## 2023-08-16 ENCOUNTER — Inpatient Hospital Stay (HOSPITAL_COMMUNITY)

## 2023-08-16 DIAGNOSIS — E871 Hypo-osmolality and hyponatremia: Secondary | ICD-10-CM | POA: Diagnosis not present

## 2023-08-16 DIAGNOSIS — I480 Paroxysmal atrial fibrillation: Secondary | ICD-10-CM

## 2023-08-16 DIAGNOSIS — I4891 Unspecified atrial fibrillation: Secondary | ICD-10-CM | POA: Diagnosis not present

## 2023-08-16 DIAGNOSIS — N179 Acute kidney failure, unspecified: Secondary | ICD-10-CM | POA: Diagnosis not present

## 2023-08-16 DIAGNOSIS — E876 Hypokalemia: Secondary | ICD-10-CM | POA: Diagnosis not present

## 2023-08-16 DIAGNOSIS — I10 Essential (primary) hypertension: Secondary | ICD-10-CM | POA: Diagnosis not present

## 2023-08-16 LAB — PROTIME-INR
INR: 1.2 (ref 0.8–1.2)
Prothrombin Time: 15.6 s — ABNORMAL HIGH (ref 11.4–15.2)

## 2023-08-16 LAB — CBC
HCT: 27.3 % — ABNORMAL LOW (ref 36.0–46.0)
Hemoglobin: 9.7 g/dL — ABNORMAL LOW (ref 12.0–15.0)
MCH: 28.7 pg (ref 26.0–34.0)
MCHC: 35.5 g/dL (ref 30.0–36.0)
MCV: 80.8 fL (ref 80.0–100.0)
Platelets: 187 10*3/uL (ref 150–400)
RBC: 3.38 MIL/uL — ABNORMAL LOW (ref 3.87–5.11)
RDW: 13.7 % (ref 11.5–15.5)
WBC: 8.5 10*3/uL (ref 4.0–10.5)
nRBC: 0 % (ref 0.0–0.2)

## 2023-08-16 LAB — OSMOLALITY, URINE: Osmolality, Ur: 261 mosm/kg — ABNORMAL LOW (ref 300–900)

## 2023-08-16 LAB — ECHOCARDIOGRAM COMPLETE
AR max vel: 1.49 cm2
AV Area VTI: 1.55 cm2
AV Area mean vel: 1.37 cm2
AV Mean grad: 11 mmHg
AV Peak grad: 19.2 mmHg
Ao pk vel: 2.19 m/s
Area-P 1/2: 5.36 cm2
Calc EF: 63.4 %
Height: 64 in
S' Lateral: 3.5 cm
Single Plane A2C EF: 60.9 %
Single Plane A4C EF: 65.7 %
Weight: 2684.32 [oz_av]

## 2023-08-16 LAB — BRAIN NATRIURETIC PEPTIDE: B Natriuretic Peptide: 582.2 pg/mL — ABNORMAL HIGH (ref 0.0–100.0)

## 2023-08-16 LAB — URINE CULTURE
MICRO NUMBER:: 16382968
Result:: NO GROWTH
SPECIMEN QUALITY:: ADEQUATE

## 2023-08-16 LAB — APTT: aPTT: 40 s — ABNORMAL HIGH (ref 24–36)

## 2023-08-16 LAB — BASIC METABOLIC PANEL WITH GFR
Anion gap: 12 (ref 5–15)
BUN: 14 mg/dL (ref 8–23)
CO2: 21 mmol/L — ABNORMAL LOW (ref 22–32)
Calcium: 8.1 mg/dL — ABNORMAL LOW (ref 8.9–10.3)
Chloride: 87 mmol/L — ABNORMAL LOW (ref 98–111)
Creatinine, Ser: 0.85 mg/dL (ref 0.44–1.00)
GFR, Estimated: >60 mL/min (ref >60)
Glucose, Bld: 159 mg/dL — ABNORMAL HIGH (ref 70–99)
Potassium: 3.4 mmol/L — ABNORMAL LOW (ref 3.5–5.1)
Sodium: 120 mmol/L — ABNORMAL LOW (ref 135–145)

## 2023-08-16 LAB — GLUCOSE, CAPILLARY
Glucose-Capillary: 178 mg/dL — ABNORMAL HIGH (ref 70–99)
Glucose-Capillary: 169 mg/dL — ABNORMAL HIGH (ref 70–99)
Glucose-Capillary: 119 mg/dL — ABNORMAL HIGH (ref 70–99)
Glucose-Capillary: 178 mg/dL — ABNORMAL HIGH (ref 70–99)

## 2023-08-16 LAB — SODIUM
Sodium: 118 mmol/L — CL (ref 135–145)
Sodium: 120 mmol/L — ABNORMAL LOW (ref 135–145)
Sodium: 122 mmol/L — ABNORMAL LOW (ref 135–145)

## 2023-08-16 LAB — OSMOLALITY: Osmolality: 250 mosm/kg — ABNORMAL LOW (ref 275–295)

## 2023-08-16 LAB — TSH: TSH: 0.794 u[IU]/mL (ref 0.350–4.500)

## 2023-08-16 LAB — ABO/RH

## 2023-08-16 LAB — SODIUM, URINE, RANDOM: Sodium, Ur: 30 mmol/L

## 2023-08-16 LAB — PROCALCITONIN: Procalcitonin: 0.32 ng/mL

## 2023-08-16 LAB — MAGNESIUM: Magnesium: 2.1 mg/dL (ref 1.7–2.4)

## 2023-08-16 MED ORDER — SALINE SPRAY 0.65 % NA SOLN
1.0000 | NASAL | Status: DC | PRN
  Administered 2023-08-16: 1 via NASAL
  Filled 2023-08-16: qty 44

## 2023-08-16 MED ORDER — POTASSIUM CHLORIDE CRYS ER 20 MEQ PO TBCR
40.0000 meq | EXTENDED_RELEASE_TABLET | Freq: Once | ORAL | Status: AC
  Administered 2023-08-16: 40 meq via ORAL
  Filled 2023-08-16: qty 2

## 2023-08-16 MED ORDER — IPRATROPIUM-ALBUTEROL 0.5-2.5 (3) MG/3ML IN SOLN
3.0000 mL | Freq: Four times a day (QID) | RESPIRATORY_TRACT | Status: DC | PRN
  Administered 2023-08-16: 3 mL via RESPIRATORY_TRACT
  Filled 2023-08-16: qty 3

## 2023-08-16 MED ORDER — SODIUM CHLORIDE 0.9 % IV SOLN: INTRAVENOUS | Status: AC

## 2023-08-16 MED ORDER — CHLORHEXIDINE GLUCONATE CLOTH 2 % EX PADS
6.0000 | MEDICATED_PAD | Freq: Every day | CUTANEOUS | Status: DC
  Administered 2023-08-16 – 2023-08-18 (×3): 6 via TOPICAL

## 2023-08-16 MED ORDER — ORAL CARE MOUTH RINSE: 15.0000 mL | OROMUCOSAL | Status: DC | PRN

## 2023-08-16 MED ORDER — GUAIFENESIN 100 MG/5ML PO LIQD
5.0000 mL | ORAL | Status: DC | PRN
  Administered 2023-08-16 – 2023-08-17 (×3): 5 mL via ORAL
  Filled 2023-08-16 (×3): qty 10

## 2023-08-16 MED ORDER — METOPROLOL TARTRATE 50 MG PO TABS
100.0000 mg | ORAL_TABLET | Freq: Two times a day (BID) | ORAL | Status: DC
  Administered 2023-08-16 – 2023-08-18 (×5): 100 mg via ORAL
  Filled 2023-08-16: qty 4
  Filled 2023-08-16: qty 2
  Filled 2023-08-16 (×2): qty 4
  Filled 2023-08-16: qty 2

## 2023-08-16 NOTE — Plan of Care (Signed)
  Problem: Clinical Measurements: Goal: Cardiovascular complication will be avoided Outcome: Not Progressing   Problem: Activity: Goal: Risk for activity intolerance will decrease Outcome: Not Progressing   Problem: Coping: Goal: Ability to adjust to condition or change in health will improve Outcome: Not Progressing   Problem: Fluid Volume: Goal: Ability to maintain a balanced intake and output will improve Outcome: Not Progressing

## 2023-08-16 NOTE — Hospital Course (Signed)
 Casey Bryan is a 78 y.o. female with a history of diabetes mellitus, GERD, hyperlipidemia, hypertension.  Patient presented secondary to an abnormal lab significant for hyponatremia.  Sodium on admission of 115 with associated hypokalemia with evidence of likely dehydration.  IV fluids started and electrolyte supplementation started.

## 2023-08-16 NOTE — Consult Note (Signed)
 Cardiology Consultation   Patient ID: [facility] MRN: [id number]; DOB: Oct 17, 1945  Admit date: 08/15/2023 Date of Consult: 08/16/2023  PCP:  Ezell Hollow, MD   [facility] Providers Cardiologist:  Gaylyn Keas, MD  - new this admission    Patient Profile:   [facility] is a 78 y.o. female with a hx of GERD, HTN, HLD, type 2 DM who is being seen 08/16/2023 for the evaluation of atrial fibrillation at the request of Dr. Duard Getting.  History of Present Illness:   [facility] is a 78 year old female with above medical history. Per chart review, it does not appear that patient has any past cardiac history.   Patient presented to her PCP office on 4/28 with fatigue and urinary symptoms. She reported that her symptoms started on 4/25 and included frequent urination, low-grade fever, headache. On 4/25, she went on to develop abdominal pain, vomiting, diarrhea. Her PCP started zofran  and macrobid. A CMP showed her sodium was critically low at 115, and she was sent to the ED for further evaluation   Presented to the ED 4/28 with similar symptoms. Initial vital signs in the ED showed HR 77 BPM, BP 114/57, oxygen 98%. EKG showed sinus rhythm with PACs, H 92 BPM, baseline artifact present. Labs in the ED significant for Na 115, K 2.9, creatinine 1.24, WBC 9.3, hemoglobin 10.8, platelets 192. hsTn 8.   CXR showed mild enlargement of the cardiac silhouette, increased pulmonary vascular congestion and interstitial prominence. CT abdomen pelvis showed no acute intra-abdominal or pelvic pathology, left lower lobe pneumonia. Patient was admitted to the [facility] medicine service at [facility] for treatment of hyponatremia, gastroenteritis, pneumonia. Overnight on 4/28-4/29, patient had EKG concerning for atrial fibrillation. Cardiology consulted.   On interview, patient denies having any past cardiac history. She denies palpitations, shortness of breath, orthopnea, chest pain. She has chronic lower extremity  edema for which she takes chlorthalidone . Reports that she has not been eating very much recently and she lost some weight because of this. She was given an antibiotic for a suspected UTI as an outpatient, and she took this on an empty stomach which caused her to have nausea and one episode of vomiting. Her highest temperature as an outpatient was 99 degrees F.    Past Medical History:  Diagnosis Date   Amaurosis fugax 12/12/2015   Diabetes mellitus    GERD (gastroesophageal reflux disease)    Glaucoma 04-2012   Hyperlipemia    Hypertension     Past Surgical History:  Procedure Laterality Date   NO PAST SURGERIES        Inpatient Medications: Scheduled Meds:  aspirin EC  81 mg Oral Daily   Chlorhexidine Gluconate Cloth  6 each Topical Daily   insulin aspart  0-15 Units Subcutaneous TID WC   insulin aspart  0-5 Units Subcutaneous QHS   pantoprazole  40 mg Oral Daily   sodium chloride flush  3 mL Intravenous Q12H   Continuous Infusions:  azithromycin Stopped (08/15/23 2332)   cefTRIAXone (ROCEPHIN)  IV 1 g (08/16/23 0909)   lactated ringers Stopped (08/16/23 0751)   metronidazole 500 mg (08/16/23 0910)   PRN Meds: acetaminophen **OR** acetaminophen, hydrALAZINE, labetalol, mouth rinse, polyethylene glycol  Allergies:    Allergies  Allergen Reactions   Prevnar [Pneumococcal 13-Val Conj Vacc] Nausea And Vomiting and Other (See Comments)    Fever of 102.    Clarithromycin  [Clarithromycin ]     Contradictions w/Vytorin   Penicillins    Tetanus-Diphtheria Toxoids Td     REACTION: see OV note  02-11-10    Social History:   Social History   Socioeconomic History   Marital status: Widowed    Spouse name: Not on file   Number of children: 3   Years of education: Not on file   Highest education level: Associate degree: academic program  Occupational History   Occupation: semi-retired,own a busines, Airline pilot  Tobacco Use   Smoking status: Never   Smokeless tobacco:  Never  Substance and Sexual Activity   Alcohol use: Yes    Comment: socially   Drug use: No   Sexual activity: Not on file  Other Topics Concern   Not on file  Social History Narrative   Lost her husband in early 2025   Lives by herself but a niece is w/ her for now  (as off 06/2023)   Original [place]    Social Drivers of Health   Financial Resource Strain: Low Risk  (06/23/2023)   Overall Financial Resource Strain (CARDIA)    Difficulty of Paying Living Expenses: Not hard at all  Food Insecurity: No Food Insecurity (08/15/2023)   Hunger Vital Sign    Worried About Running Out of Food in the Last Year: Never true    Ran Out of Food in the Last Year: Never true  Transportation Needs: No Transportation Needs (08/15/2023)   PRAPARE - Administrator, Civil Service (Medical): No    Lack of Transportation (Non-Medical): No  Physical Activity: Insufficiently Active (06/23/2023)   Exercise Vital Sign    Days of Exercise per Week: 2 days    Minutes of Exercise per Session: 20 min  Stress: No Stress Concern Present (06/23/2023)   [employer] of Occupational Health - Occupational Stress Questionnaire    Feeling of Stress : Not at all  Social Connections: Moderately Integrated (08/15/2023)   Social Connection and Isolation Panel [NHANES]    Frequency of Communication with Friends and Family: More than three times a week    Frequency of Social Gatherings with Friends and Family: More than three times a week    Attends Religious Services: More than 4 times per year    Active Member of [employer] or Organizations: Yes    Attends Banker Meetings: More than 4 times per year    Marital Status: Widowed  Intimate Partner Violence: Unknown (08/15/2023)   Humiliation, Afraid, Rape, and Kick questionnaire    Fear of Current or Ex-Partner: Patient unable to answer    Emotionally Abused: Not on file    Physically Abused: Not on file    Sexually Abused: Not on file    Family  History:    Family History  Problem Relation Age of Onset   Diabetes Mother        uncles, mother , father    Coronary artery disease Other        F age 8, B age 7, uncle age 9 : MI   Heart attack Brother 85   Breast cancer Sister    Multiple myeloma Sister    Colon cancer Neg Hx      ROS:  Please see the history of present illness.   All other ROS reviewed and negative.     Physical Exam/Data:   Vitals:   08/16/23 0417 08/16/23 0539 08/16/23 0615 08/16/23 0800  BP: (!) 161/62 (!) 178/69 (!) 174/51   Pulse: (!) 105 99 (!) 105  Resp:  (!) 25 (!) 22   Temp: 98.5 F (36.9 C)   99 F (37.2 C)  TempSrc: Oral   Oral  SpO2: 99% 98% 96%   Weight:      Height:        Intake/Output Summary (Last 24 hours) at 08/16/2023 0930 Last data filed at 08/16/2023 0754 Gross per 24 hour  Intake 2574.6 ml  Output 850 ml  Net 1724.6 ml      08/15/2023    8:14 PM 08/15/2023   10:54 AM 06/24/2023    3:30 PM  Last 3 Weights  Weight (lbs) 167 lb 12.3 oz 171 lb 9.6 oz 173 lb 8 oz  Weight (kg) 76.1 kg 77.837 kg 78.699 kg     Body mass index is 28.8 kg/m.  General:  Well nourished, well developed, in no acute distress. Sitting upright in the bed  HEENT: normal Neck: no JVD  Cardiac:  normal S1, S2; RRR; no murmur   Lungs:  crackles in bilateral lung bases, normal WOB on room air  Abd: soft, nontender  Ext: trace edema in BLE  Musculoskeletal:  No deformities Skin: warm and dry  Neuro:  CNs 2-12 intact, no focal abnormalities noted Psych:  Normal affect   EKG:  The EKG was personally reviewed and demonstrates:  EKG yesterday at 2142 showed atrial fibrillation with HR 102 BPM  Telemetry:  Telemetry was personally reviewed and demonstrates:  Patient was in atrial fibrillation from about 10 PM yesterday until 3 AM today. She has been maintaining NSR with rare PACs since   Relevant CV Studies:   Laboratory Data:  High Sensitivity Troponin:   Recent Labs  Lab 08/15/23 2214   TROPONINIHS 8     Chemistry Recent Labs  Lab 08/15/23 1349 08/15/23 1538 08/15/23 2214  NA 115* 115* 120*  K 3.2* 2.9* 2.8*  CL 77* 76* 83*  CO2 27 25 24   GLUCOSE 135* 156* 144*  BUN 16 16 15   CREATININE 1.00 1.24* 1.06*  CALCIUM  8.2* 9.0 8.2*  MG  --   --  0.9*  GFRNONAA  --  45* 54*  ANIONGAP  --  14 13    Recent Labs  Lab 08/15/23 1349 08/15/23 1538  PROT 6.3 7.0  ALBUMIN 3.5 4.0  AST 35 51*  ALT 19 28  ALKPHOS 44 61  BILITOT 0.9 0.8   Lipids No results for input(s): "CHOL", "TRIG", "HDL", "LABVLDL", "LDLCALC", "CHOLHDL" in the last 168 hours.  Hematology Recent Labs  Lab 08/15/23 1349 08/15/23 1538  WBC 9.7 9.3  RBC 3.77* 3.76*  HGB 11.0* 10.8*  HCT 31.3* 30.0*  MCV 83.2 79.8*  MCH  --  28.7  MCHC 35.0 36.0  RDW 14.5 13.6  PLT 203.0 192   Thyroid  No results for input(s): "TSH", "FREET4" in the last 168 hours.  BNPNo results for input(s): "BNP", "PROBNP" in the last 168 hours.  DDimer No results for input(s): "DDIMER" in the last 168 hours.   Radiology/Studies:  CT ABDOMEN PELVIS W CONTRAST Result Date: 08/15/2023 CLINICAL DATA:  Abdominal pain. EXAM: CT ABDOMEN AND PELVIS WITH CONTRAST TECHNIQUE: Multidetector CT imaging of the abdomen and pelvis was performed using the standard protocol following bolus administration of intravenous contrast. RADIATION DOSE REDUCTION: This exam was performed according to the departmental dose-optimization program which includes automated exposure control, adjustment of the mA and/or kV according to patient size and/or use of iterative reconstruction technique. CONTRAST:  100mL OMNIPAQUE IOHEXOL 300 MG/ML  SOLN  COMPARISON:  CT abdomen pelvis dated 10/27/2022. FINDINGS: Lower chest: Patchy area of airspace opacity in the visualized left lower lobe concerning for pneumonia or aspiration. Clinical correlation and follow-up to resolution recommended. Trace pericardial effusion. No intra-abdominal free air or free fluid.  Hepatobiliary: The liver is unremarkable. No biliary dilatation. The gallbladder is unremarkable. Pancreas: Unremarkable. No pancreatic ductal dilatation or surrounding inflammatory changes. Spleen: Normal in size without focal abnormality. Adrenals/Urinary Tract: The adrenal glands are unremarkable. Right renal parapelvic cyst. There is no hydronephrosis on either side. There is symmetric enhancement and excretion of contrast by both kidneys. The visualized ureters and urinary bladder appear unremarkable. Stomach/Bowel: There is no bowel obstruction or active inflammation. The appendix is normal. Vascular/Lymphatic: Advanced aortoiliac atherosclerotic disease. The IVC is unremarkable. No portal venous gas. There is no adenopathy. Reproductive: The uterus is grossly unremarkable. No suspicious adnexal masses. Other: None Musculoskeletal: Osteopenia with degenerative changes of the spine. No acute osseous pathology. IMPRESSION: 1. No acute intra-abdominal or pelvic pathology. 2. Left lower lobe pneumonia or aspiration. 3.  Aortic Atherosclerosis (ICD10-I70.0). Electronically Signed   By: Angus Bark M.D.   On: 08/15/2023 17:32   DG Chest Portable 1 View Result Date: 08/15/2023 CLINICAL DATA:  Short of breath EXAM: PORTABLE CHEST 1 VIEW COMPARISON:  09/10/2003 FINDINGS: Single frontal view of the chest demonstrates mild enlargement the cardiac silhouette. There is increased pulmonary vascular congestion and interstitial prominence, compatible with fluid overload and interstitial edema. No acute airspace disease, effusion, or pneumothorax. No acute bony abnormalities. IMPRESSION: 1. Constellation of findings most consistent with mild congestive heart failure and developing interstitial edema. Electronically Signed   By: Bobbye Burrow M.D.   On: 08/15/2023 16:50     Assessment and Plan:   Paroxysmal Atrial Fibrillation  - Patient appears to have had atrial fibrillation overnight, HR was overall well  controlled. She denies having any palpitations, chest pain, shortness of breath. She converted to NSR around 3 AM and has been in NSR since  - Atrial fibrillation occurred in the setting of hypokalemia (K 2.8), hypomagnesemia (mag 0.9). She also is being worked up for suspected UTI, pneumonia  - TSH pending. Echocardiogram pending  - Overall, episode of atrial fibrillation was rather short and was likely triggered by electrolyte abnormalities and possible infection. She is now maintaining NSR.  - continue home metoprolol  tartrate 100 mg BID  - Continue to monitor on telemetry. If she has recurrence of atrial fibrillation, will need to start DOAC. If she does not have recurrence of afib this admission and if atria are normal on echo, consider discharging with cardiac monitor   Abnormal CXR  - CXR on admission showed constellation of findings most consistent with mild congestive heart failure, developing interstitial edema. CT abdomen pelvis showed left lower lobe pneumonia or aspiration  - Patient appears euvolemic on exam. Denies shortness of breath, orthopnea. - Currently on antibiotics for suspected PNA. However, she has normal WBC. Reported a low grade fever, but highest temp 57F - CRP pending. I added on BNP  - Echo pending   Otherwise per primary  - Pneumonia  - Hyponatremia, hypokalemia, hypomagnesemia  - Gastroenteritis  - GERD - Type 2 DM    Risk Assessment/Risk Scores:    CHA2DS2-VASc Score = 5  This indicates a 7.2% annual risk of stroke. The patient's score is based upon: CHF History: 0 HTN History: 1 Diabetes History: 1 Stroke History: 0 Vascular Disease History: 0 Age Score: 2 Gender Score: 1  For questions or updates, please contact [facility] Please consult [email or web address] for contact info under    Signed, Debria Fang, PA-C  08/16/2023 9:30 AM

## 2023-08-16 NOTE — TOC Initial Note (Signed)
 Transition of Care [facility]) - Initial/Assessment Note   Patient Details  Name: [facility] MRN: [id number] Date of Birth: [birth year]  [facility]) CM/SW Contact:    Zenon Hilda, LCSW Phone Number: 08/16/2023, 8:44 AM  Clinical Narrative: Patient is from home and is currently on IV antibiotics. TOC following for possible discharge needs.  Expected Discharge Plan: Home/Self Care Barriers to Discharge: Continued Medical Work up  Expected Discharge Plan and Services In-house Referral: Clinical Social Work Living arrangements for the past 2 months: Single Family Home           DME Arranged: N/A DME Agency: NA  Prior Living Arrangements/Services Living arrangements for the past 2 months: Single Family Home Patient language and need for interpreter reviewed:: Yes Do you feel safe going back to the place where you live?: Yes      Need for Family Participation in Patient Care: No (Comment) Care giver support system in place?: Yes (comment) Criminal Activity/Legal Involvement Pertinent to Current Situation/Hospitalization: No - Comment as needed  Activities of Daily Living ADL Screening (condition at time of admission) Independently performs ADLs?: Yes (appropriate for developmental age) Is the patient deaf or have difficulty hearing?: No Does the patient have difficulty seeing, even when wearing glasses/contacts?: No Does the patient have difficulty concentrating, remembering, or making decisions?: No  Emotional Assessment Orientation: : Oriented to Self, Oriented to Place, Oriented to  Time, Oriented to Situation Alcohol / Substance Use: Not Applicable Psych Involvement: No (comment)  Admission diagnosis:  Hyponatremia [E87.1] Patient Active Problem List   Diagnosis Date Noted   Hyponatremia 08/15/2023   AKI (acute kidney injury) (HCC) 08/15/2023   Pneumonia 08/15/2023   Gastroenteritis 08/15/2023   A-fib (HCC) 08/15/2023   Hydronephrosis of right kidney, likely  chronic UPJ obstruction 11/04/2022   Amaurosis fugax 12/12/2015   Stroke (HCC) dx 03-2015 12/12/2015   Carotid artery stenosis 05/13/2015   PCP NOTES >>>>> 03/25/2015   Skin lesion 12/26/2013   Fatigue 06/16/2012   Hemorrhoids 08/10/2011   Annual physical exam >>>>>>>>>>>>>>>>>>>> 08/11/2010   Headache 10/14/2009   Vitamin D  deficiency 06/11/2009   DMII (diabetes mellitus, type 2) (HCC) 10/03/2007   GERD 10/03/2007   Hyperlipidemia 11/14/2006   Essential hypertension 11/14/2006   PCP:  Ezell Hollow, MD Pharmacy:   [facility] [phone] - [place], [place] - [phone] [place] [address] Phone: [phone] Fax: [phone]  Social Drivers of Health (SDOH) Social History: SDOH Screenings   Food Insecurity: No Food Insecurity (08/15/2023)  Housing: Low Risk  (08/15/2023)  Transportation Needs: No Transportation Needs (08/15/2023)  Utilities: Not At Risk (08/15/2023)  Alcohol Screen: Low Risk  (06/23/2023)  Depression (PHQ2-9): Low Risk  (10/20/2022)  Financial Resource Strain: Low Risk  (06/23/2023)  Physical Activity: Insufficiently Active (06/23/2023)  Social Connections: Moderately Integrated (08/15/2023)  Stress: No Stress Concern Present (06/23/2023)  Tobacco Use: Low Risk  (08/15/2023)   SDOH Interventions:    Readmission Risk Interventions     No data to display

## 2023-08-16 NOTE — Progress Notes (Signed)
  Echocardiogram 2D Echocardiogram has been performed.  Annis Kinder, RDCS 08/16/2023, 4:11 PM

## 2023-08-16 NOTE — Progress Notes (Signed)
 PROGRESS NOTE    Casey Bryan  ZOX:[id number] DOB: [birth year] DOA: 08/15/2023 PCP: Ezell Hollow, MD   Brief Narrative: Casey Bryan is a 78 y.o. female with a history of diabetes mellitus, GERD, hyperlipidemia, hypertension.  Patient presented secondary to an abnormal lab significant for hyponatremia.  Sodium on admission of 115 with associated hypokalemia with evidence of likely dehydration.  IV fluids started and electrolyte supplementation started.   Assessment and Plan:  Hyponatremia Presumed chronic and likely related to poor oral intake secondary to recent illness.  Severe on admission with sodium level 115.  Patient started on lactated ringer IV fluids.  Sodium improved to 120 and has remained stable.  TSH normal at 0.794.  Cortisol ordered for 4/30. - Discontinue lactated Ringer's and start normal saline - Serial sodium checks to ensure continued improvement - Daily BMP  Possible pneumonia CT scan significant for possible pneumonia versus aspiration.  Patient without significant cough.  No fevers, no leukocytosis.  Patient was empirically started on ceftriaxone, azithromycin, Flagyl.  Procalcitonin ordered and is elevated at 0.32.  -Since procalcitonin is elevated, continue ceftriaxone and azithromycin -Follow-up blood culture results  Paroxysmal atrial fibrillation Cardiology consulted on admission.  Patient is managed on metoprolol  tartrate as an outpatient.  Patient is not on anticoagulation.  Patient is currently in sinus rhythm. - Continue metoprolol  tartrate  Nausea/vomiting Diarrhea Per patient/family, only 1 episode.  No persistent symptoms.  C. difficile testing ordered on admission and is pending, however patient may not produce a sample for this test.  AKI Baseline creatinine of 0.9-1.  Mild AKI with creatinine up to 1.24 on admission with quick resolution after IV fluids.  GERD Patient is on omeprazole  as an outpatient which was transitioned to pantoprazole  while admitted. - Continue pantoprazole  Diabetes mellitus type 2 Well-controlled based on recent hemoglobin A1c of 6.0%.  Patient is on metformin  as an outpatient which is held on admission.  Patient started on subcutaneous insulin on admission - Continue sliding scale insulin   DVT prophylaxis: SCDs Code Status:   Code Status: Full Code Family Communication: Son at bedside.  Daughter via telephone at bedside. Disposition Plan: Discharge home likely in 2 to 3 days pending continued improvement in sodium level in addition to stabilization of electrolytes overall.   Consultants:  Cardiology  Procedures:  None  Antimicrobials: Ceftriaxone Azithromycin Flagyl   Subjective: Patient reports feeling poorly.  Did not get much sleep secondary to being woken up overnight.  Feels weak.  Objective: BP (!) 174/51   Pulse (!) 105   Temp 98.5 F (36.9 C) (Oral)   Resp (!) 22   Ht 5\' 4"  (1.626 m)   Wt 76.1 kg   SpO2 96%   BMI 28.80 kg/m   Examination:  General exam: Appears calm and comfortable.  Ill-appearing. Respiratory system: Clear to auscultation. Respiratory effort normal. Cardiovascular system: S1 & S2 heard, RRR. No murmurs. Gastrointestinal system: Abdomen is nondistended, soft and nontender. Normal bowel sounds heard. Central nervous system: Alert and oriented. No focal neurological deficits. Musculoskeletal: 1+ bilateral lower extremity pitting edema. No calf tenderness Skin: No cyanosis. No rashes Psychiatry: Judgement and insight appear normal. Mood & affect appropriate.    Data Reviewed: I have personally reviewed following labs and imaging studies  CBC Lab Results  Component Value Date   WBC 9.3 08/15/2023   RBC 3.76 (L) 08/15/2023   HGB 10.8 (L) 08/15/2023   HCT 30.0 (L) 08/15/2023   MCV 79.8 (L)  08/15/2023   MCH 28.7 08/15/2023   PLT 192 08/15/2023   MCHC 36.0 08/15/2023   RDW 13.6 08/15/2023   LYMPHSABS 0.4 (L) 08/15/2023   MONOABS 0.5  08/15/2023   EOSABS 0.0 08/15/2023   BASOSABS 0.0 08/15/2023     Last metabolic panel Lab Results  Component Value Date   NA 120 (L) 08/15/2023   K 2.8 (L) 08/15/2023   CL 83 (L) 08/15/2023   CO2 24 08/15/2023   BUN 15 08/15/2023   CREATININE 1.06 (H) 08/15/2023   GLUCOSE 144 (H) 08/15/2023   GFRNONAA 54 (L) 08/15/2023   GFRAA 109 05/27/2008   CALCIUM  8.2 (L) 08/15/2023   PROT 7.0 08/15/2023   ALBUMIN 4.0 08/15/2023   BILITOT 0.8 08/15/2023   ALKPHOS 61 08/15/2023   AST 51 (H) 08/15/2023   ALT 28 08/15/2023   ANIONGAP 13 08/15/2023    GFR: Estimated Creatinine Clearance: 45.1 mL/min (A) (by C-G formula based on SCr of 1.06 mg/dL (H)).  Recent Results (from the past 240 hours)  Resp panel by RT-PCR (RSV, Flu A&B, Covid) Anterior Nasal Swab     Status: None   Collection Time: 08/15/23  4:03 PM   Specimen: Anterior Nasal Swab  Result Value Ref Range Status   SARS Coronavirus 2 by RT PCR NEGATIVE NEGATIVE Final    Comment: (NOTE) SARS-CoV-2 target nucleic acids are NOT DETECTED.  The SARS-CoV-2 RNA is generally detectable in upper respiratory specimens during the acute phase of infection. The lowest concentration of SARS-CoV-2 viral copies this assay can detect is 138 copies/mL. A negative result does not preclude SARS-Cov-2 infection and should not be used as the sole basis for treatment or other patient management decisions. A negative result may occur with  improper specimen collection/handling, submission of specimen other than nasopharyngeal swab, presence of viral mutation(s) within the areas targeted by this assay, and inadequate number of viral copies(<138 copies/mL). A negative result must be combined with clinical observations, patient history, and epidemiological information. The expected result is Negative.  Fact Sheet for Patients:  BloggerCourse.com  Fact Sheet for Healthcare Providers:   SeriousBroker.it  This test is no t yet approved or cleared by the [place]  FDA and  has been authorized for detection and/or diagnosis of SARS-CoV-2 by FDA under an Emergency Use Authorization (EUA). This EUA will remain  in effect (meaning this test can be used) for the duration of the COVID-19 declaration under Section 564(b)(1) of the Act, 21 U.S.C.section 360bbb-3(b)(1), unless the authorization is terminated  or revoked sooner.       Influenza A by PCR NEGATIVE NEGATIVE Final   Influenza B by PCR NEGATIVE NEGATIVE Final    Comment: (NOTE) The Xpert Xpress SARS-CoV-2/FLU/RSV plus assay is intended as an aid in the diagnosis of influenza from Nasopharyngeal swab specimens and should not be used as a sole basis for treatment. Nasal washings and aspirates are unacceptable for Xpert Xpress SARS-CoV-2/FLU/RSV testing.  Fact Sheet for Patients: BloggerCourse.com  Fact Sheet for Healthcare Providers: SeriousBroker.it  This test is not yet approved or cleared by the [place]  FDA and has been authorized for detection and/or diagnosis of SARS-CoV-2 by FDA under an Emergency Use Authorization (EUA). This EUA will remain in effect (meaning this test can be used) for the duration of the COVID-19 declaration under Section 564(b)(1) of the Act, 21 U.S.C. section 360bbb-3(b)(1), unless the authorization is terminated or revoked.     Resp Syncytial Virus by PCR NEGATIVE NEGATIVE Final  Comment: (NOTE) Fact Sheet for Patients: BloggerCourse.com  Fact Sheet for Healthcare Providers: SeriousBroker.it  This test is not yet approved or cleared by the [place]  FDA and has been authorized for detection and/or diagnosis of SARS-CoV-2 by FDA under an Emergency Use Authorization (EUA). This EUA will remain in effect (meaning this test can be used) for  the duration of the COVID-19 declaration under Section 564(b)(1) of the Act, 21 U.S.C. section 360bbb-3(b)(1), unless the authorization is terminated or revoked.  Performed at [facility], [address], [place], [place] [address]   Blood culture (routine x 2)     Status: None (Preliminary result)   Collection Time: 08/15/23  6:14 PM   Specimen: BLOOD  Result Value Ref Range Status   Specimen Description   Final    BLOOD RIGHT ANTECUBITAL Performed at [facility], [address] [place], [place] [address]    Special Requests   Final    BOTTLES DRAWN AEROBIC AND ANAEROBIC Blood Culture adequate volume Performed at [facility], [address], [place], [place] [address]    Culture   Final    NO GROWTH < 12 HOURS Performed at [facility], [address] [place], [place] [address]    Report Status PENDING  Incomplete  Blood culture (routine x 2)     Status: None (Preliminary result)   Collection Time: 08/15/23 10:14 PM   Specimen: BLOOD  Result Value Ref Range Status   Specimen Description   Final    BLOOD BLOOD LEFT ARM Performed at [facility], [address] [place], [place] [address]    Special Requests   Final    BOTTLES DRAWN AEROBIC AND ANAEROBIC Blood Culture results may not be optimal due to an inadequate volume of blood received in culture bottles Performed at [facility], [address] [place], [place] [address]    Culture   Final    NO GROWTH < 12 HOURS Performed at [facility], [address], [place], [place] [address]    Report Status PENDING  Incomplete      Radiology Studies: CT ABDOMEN PELVIS W CONTRAST Result Date: 08/15/2023 CLINICAL DATA:  Abdominal pain. EXAM: CT ABDOMEN AND PELVIS WITH CONTRAST TECHNIQUE: Multidetector CT imaging of the abdomen and pelvis was performed using the standard protocol following bolus administration of  intravenous contrast. RADIATION DOSE REDUCTION: This exam was performed according to the departmental dose-optimization program which includes automated exposure control, adjustment of the mA and/or kV according to patient size and/or use of iterative reconstruction technique. CONTRAST:  100mL OMNIPAQUE IOHEXOL 300 MG/ML  SOLN COMPARISON:  CT abdomen pelvis dated 10/27/2022. FINDINGS: Lower chest: Patchy area of airspace opacity in the visualized left lower lobe concerning for pneumonia or aspiration. Clinical correlation and follow-up to resolution recommended. Trace pericardial effusion. No intra-abdominal free air or free fluid. Hepatobiliary: The liver is unremarkable. No biliary dilatation. The gallbladder is unremarkable. Pancreas: Unremarkable. No pancreatic ductal dilatation or surrounding inflammatory changes. Spleen: Normal in size without focal abnormality. Adrenals/Urinary Tract: The adrenal glands are unremarkable. Right renal parapelvic cyst. There is no hydronephrosis on either side. There is symmetric enhancement and excretion of contrast by both kidneys. The visualized ureters and urinary bladder appear unremarkable. Stomach/Bowel: There is no bowel obstruction or active inflammation. The appendix is normal. Vascular/Lymphatic: Advanced aortoiliac atherosclerotic disease. The IVC is unremarkable. No portal venous gas. There is no adenopathy. Reproductive: The uterus is grossly unremarkable.  No suspicious adnexal masses. Other: None Musculoskeletal: Osteopenia with degenerative changes of the spine. No acute osseous pathology. IMPRESSION: 1. No acute intra-abdominal or pelvic pathology. 2. Left lower lobe pneumonia or aspiration. 3.  Aortic Atherosclerosis (ICD10-I70.0). Electronically Signed   By: Angus Bark M.D.   On: 08/15/2023 17:32   DG Chest Portable 1 View Result Date: 08/15/2023 CLINICAL DATA:  Short of breath EXAM: PORTABLE CHEST 1 VIEW COMPARISON:  09/10/2003 FINDINGS: Single  frontal view of the chest demonstrates mild enlargement the cardiac silhouette. There is increased pulmonary vascular congestion and interstitial prominence, compatible with fluid overload and interstitial edema. No acute airspace disease, effusion, or pneumothorax. No acute bony abnormalities. IMPRESSION: 1. Constellation of findings most consistent with mild congestive heart failure and developing interstitial edema. Electronically Signed   By: Bobbye Burrow M.D.   On: 08/15/2023 16:50      LOS: 1 day    Aneita Keens, MD [facility] 08/16/2023, 7:21 AM   If 7PM-7AM, please contact night-coverage [email or web address]

## 2023-08-17 ENCOUNTER — Other Ambulatory Visit: Payer: Self-pay | Admitting: Cardiology

## 2023-08-17 DIAGNOSIS — E876 Hypokalemia: Secondary | ICD-10-CM | POA: Diagnosis not present

## 2023-08-17 DIAGNOSIS — I4891 Unspecified atrial fibrillation: Secondary | ICD-10-CM | POA: Diagnosis not present

## 2023-08-17 DIAGNOSIS — J9600 Acute respiratory failure, unspecified whether with hypoxia or hypercapnia: Secondary | ICD-10-CM

## 2023-08-17 DIAGNOSIS — J189 Pneumonia, unspecified organism: Secondary | ICD-10-CM

## 2023-08-17 DIAGNOSIS — E871 Hypo-osmolality and hyponatremia: Secondary | ICD-10-CM | POA: Diagnosis not present

## 2023-08-17 DIAGNOSIS — R7989 Other specified abnormal findings of blood chemistry: Secondary | ICD-10-CM

## 2023-08-17 LAB — BASIC METABOLIC PANEL WITH GFR
Anion gap: 7 (ref 5–15)
BUN: 16 mg/dL (ref 8–23)
CO2: 23 mmol/L (ref 22–32)
Calcium: 8 mg/dL — ABNORMAL LOW (ref 8.9–10.3)
Chloride: 92 mmol/L — ABNORMAL LOW (ref 98–111)
Creatinine, Ser: 1.18 mg/dL — ABNORMAL HIGH (ref 0.44–1.00)
GFR, Estimated: 48 mL/min — ABNORMAL LOW (ref >60)
Glucose, Bld: 155 mg/dL — ABNORMAL HIGH (ref 70–99)
Potassium: 4 mmol/L (ref 3.5–5.1)
Sodium: 122 mmol/L — ABNORMAL LOW (ref 135–145)

## 2023-08-17 LAB — GLUCOSE, CAPILLARY
Glucose-Capillary: 127 mg/dL — ABNORMAL HIGH (ref 70–99)
Glucose-Capillary: 138 mg/dL — ABNORMAL HIGH (ref 70–99)
Glucose-Capillary: 126 mg/dL — ABNORMAL HIGH (ref 70–99)
Glucose-Capillary: 192 mg/dL — ABNORMAL HIGH (ref 70–99)

## 2023-08-17 LAB — CBC
HCT: 25.2 % — ABNORMAL LOW (ref 36.0–46.0)
Hemoglobin: 8.7 g/dL — ABNORMAL LOW (ref 12.0–15.0)
MCH: 28.7 pg (ref 26.0–34.0)
MCHC: 34.5 g/dL (ref 30.0–36.0)
MCV: 83.2 fL (ref 80.0–100.0)
Platelets: 195 10*3/uL (ref 150–400)
RBC: 3.03 MIL/uL — ABNORMAL LOW (ref 3.87–5.11)
RDW: 14.3 % (ref 11.5–15.5)
WBC: 6.3 10*3/uL (ref 4.0–10.5)
nRBC: 0 % (ref 0.0–0.2)

## 2023-08-17 LAB — HEPATIC FUNCTION PANEL
ALT: 66 U/L — ABNORMAL HIGH (ref 0–44)
AST: 98 U/L — ABNORMAL HIGH (ref 15–41)
Albumin: 2.3 g/dL — ABNORMAL LOW (ref 3.5–5.0)
Alkaline Phosphatase: 44 U/L (ref 38–126)
Bilirubin, Direct: 0.1 mg/dL (ref 0.0–0.2)
Indirect Bilirubin: 0.4 mg/dL (ref 0.3–0.9)
Total Bilirubin: 0.5 mg/dL (ref 0.0–1.2)
Total Protein: 5.6 g/dL — ABNORMAL LOW (ref 6.5–8.1)

## 2023-08-17 LAB — SODIUM
Sodium: 125 mmol/L — ABNORMAL LOW (ref 135–145)
Sodium: 125 mmol/L — ABNORMAL LOW (ref 135–145)

## 2023-08-17 LAB — CORTISOL-AM, BLOOD: Cortisol - AM: 10.7 ug/dL (ref 6.7–22.6)

## 2023-08-17 MED ORDER — AZITHROMYCIN 600 MG PO TABS
600.0000 mg | ORAL_TABLET | Freq: Every day | ORAL | Status: DC
  Filled 2023-08-17: qty 1

## 2023-08-17 MED ORDER — AMOXICILLIN 500 MG PO CAPS
500.0000 mg | ORAL_CAPSULE | Freq: Once | ORAL | Status: AC
  Administered 2023-08-17: 500 mg via ORAL
  Filled 2023-08-17: qty 1

## 2023-08-17 MED ORDER — EPINEPHRINE 0.3 MG/0.3ML IJ SOAJ
0.3000 mg | Freq: Once | INTRAMUSCULAR | Status: DC | PRN
  Filled 2023-08-17: qty 0.3

## 2023-08-17 MED ORDER — AZITHROMYCIN 250 MG PO TABS
500.0000 mg | ORAL_TABLET | Freq: Every day | ORAL | Status: AC
  Administered 2023-08-17: 500 mg via ORAL
  Filled 2023-08-17: qty 2

## 2023-08-17 MED ORDER — AMOXICILLIN-POT CLAVULANATE 875-125 MG PO TABS
1.0000 | ORAL_TABLET | Freq: Two times a day (BID) | ORAL | Status: DC
  Administered 2023-08-18: 1 via ORAL
  Filled 2023-08-17: qty 1

## 2023-08-17 MED ORDER — DIPHENHYDRAMINE HCL 50 MG/ML IJ SOLN: 25.0000 mg | Freq: Once | INTRAMUSCULAR | Status: DC | PRN

## 2023-08-17 MED ORDER — TOLVAPTAN 15 MG PO TABS
15.0000 mg | ORAL_TABLET | Freq: Once | ORAL | Status: AC
  Administered 2023-08-17: 15 mg via ORAL
  Filled 2023-08-17: qty 1

## 2023-08-17 NOTE — Consult Note (Signed)
 Reason for Consult: Hyponatremia Referring Physician: Hoyt Macleod MD [facility])  HPI:  78 year old woman with past medical history significant for hypertension, type 2 diabetes mellitus, dyslipidemia, gastroesophageal reflux disease, chronic right UPJ obstruction with mild to moderate hydronephrosis and ongoing use of thiazide diuretic (chlorthalidone ) who was admitted to the hospital with a 4-day history of urinary frequency diagnosed as UTI and started on treatment with cephalexin.  She then started to have decreasing appetite, diarrhea, an episode of vomiting and abdominal pain prompting her to come to the emergency room.  She was found to have multiple electrolyte abnormalities including hyponatremia, hypomagnesemia and hypokalemia.  Her diuretic/antihypertensive therapy was appropriately discontinued and the patient started on intravenous fluids with normal saline that improved sodium level from 115 to about 120-122.  She continues to have generalized weakness and intermittent coughing and nephrology is consulted for additional assistance with hyponatremia management.  She denies any prior history of hyponatremia (although labs from March 2025 show a sodium of 133).  She denies any regular use of nonsteroidal anti-inflammatory drugs and does not have history of congestive heart failure or adrenal disorders.  She denies any recurrent nausea, vomiting or diarrhea.  She denies any excessive fluid intake/polyuria.  Labs done earlier showed serum osmolality of 250 with sodium of 120, urine osmolality a day later was 261 with urine sodium of 30.  Sodium level this morning was 122 and has improved to 125 this afternoon.  Past Medical History:  Diagnosis Date   Amaurosis fugax 12/12/2015   Diabetes mellitus    GERD (gastroesophageal reflux disease)    Glaucoma 04-2012   Hyperlipemia    Hypertension     Past Surgical History:  Procedure Laterality Date   NO PAST SURGERIES      Family History  Problem  Relation Age of Onset   Diabetes Mother        uncles, mother , father    Coronary artery disease Other        F age 78, B age 8, uncle age 107 : MI   Heart attack Brother 86   Breast cancer Sister    Multiple myeloma Sister    Colon cancer Neg Hx     Social History:  reports that she has never smoked. She has never used smokeless tobacco. She reports current alcohol use. She reports that she does not use drugs.  Allergies:  Allergies  Allergen Reactions   Prevnar [Pneumococcal 13-Val Conj Vacc] Nausea And Vomiting and Other (See Comments)    Fever of 102.    Clarithromycin  [Clarithromycin ] Other (See Comments)    Contradictions w/Vytorin    Tetanus-Diphtheria Toxoids Td Nausea And Vomiting    REACTION: see OV note  02-11-10    Medications: I have reviewed the patient's current medications. Scheduled:  [START ON 08/18/2023] amoxicillin-clavulanate  1 tablet Oral Q12H   aspirin EC  81 mg Oral Daily   azithromycin  500 mg Oral Daily   Chlorhexidine Gluconate Cloth  6 each Topical Daily   insulin aspart  0-15 Units Subcutaneous TID WC   insulin aspart  0-5 Units Subcutaneous QHS   metoprolol  tartrate  100 mg Oral BID   pantoprazole  40 mg Oral Daily   sodium chloride flush  3 mL Intravenous Q12H      Latest Ref Rng & Units 08/17/2023    1:00 PM 08/17/2023    3:01 AM 08/16/2023    9:17 PM  BMP  Glucose 70 - 99 mg/dL  161  BUN 8 - 23 mg/dL  16    Creatinine 9.14 - 1.00 mg/dL  7.82    Sodium 956 - 213 mmol/L 125  122  122   Potassium 3.5 - 5.1 mmol/L  4.0    Chloride 98 - 111 mmol/L  92    CO2 22 - 32 mmol/L  23    Calcium  8.9 - 10.3 mg/dL  8.0        Latest Ref Rng & Units 08/17/2023    3:01 AM 08/16/2023    8:40 AM 08/15/2023    3:38 PM  CBC  WBC 4.0 - 10.5 K/uL 6.3  8.5  9.3   Hemoglobin 12.0 - 15.0 g/dL 8.7  9.7  08.6   Hematocrit 36.0 - 46.0 % 25.2  27.3  30.0   Platelets 150 - 400 K/uL 195  187  192   Urinalysis    Component Value Date/Time   COLORURINE YELLOW  08/15/2023 1538   APPEARANCEUR CLEAR 08/15/2023 1538   APPEARANCEUR Clear 11/04/2022 1310   LABSPEC 1.019 08/15/2023 1538   PHURINE 6.5 08/15/2023 1538   GLUCOSEU NEGATIVE 08/15/2023 1538   GLUCOSEU NEGATIVE 08/15/2023 1349   HGBUR TRACE (A) 08/15/2023 1538   BILIRUBINUR NEGATIVE 08/15/2023 1538   BILIRUBINUR Negative 11/04/2022 1310   KETONESUR NEGATIVE 08/15/2023 1538   PROTEINUR 30 (A) 08/15/2023 1538   UROBILINOGEN 0.2 08/15/2023 1349   NITRITE NEGATIVE 08/15/2023 1538   LEUKOCYTESUR NEGATIVE 08/15/2023 1538   ECHOCARDIOGRAM COMPLETE Result Date: 08/16/2023    ECHOCARDIOGRAM REPORT   Patient Name:   ORRA COMI Date of Exam: 08/16/2023 Medical Rec #:  [id number]     Height:       64.0 in Accession #:    [id number]    Weight:       167.8 lb Date of Birth:  [birth year]      BSA:          1.816 m Patient Age:    77 years      BP:           174/51 mmHg Patient Gender: F             HR:           85 bpm. Exam Location:  Inpatient Procedure: 2D Echo, Cardiac Doppler and Color Doppler (Both Spectral and Color            Flow Doppler were utilized during procedure). Indications:    I48.91* Unspeicified atrial fibrillation  History:        Patient has prior history of Echocardiogram examinations, most                 recent 04/09/2015. Arrythmias:Atrial Fibrillation; Risk                 Factors:Hypertension and Diabetes.  Sonographer:    Andrena Bang Referring Phys: [id number] [facility] GOEL IMPRESSIONS  1. Left ventricular ejection fraction, by estimation, is 55 to 60%. The left ventricle has normal function. The left ventricle has no regional wall motion abnormalities. Left ventricular diastolic parameters were normal.  2. Right ventricular systolic function is normal. The right ventricular size is normal. There is normal pulmonary artery systolic pressure. The estimated right ventricular systolic pressure is 31.7 mmHg.  3. Left atrial size was moderately dilated.  4. The mitral valve is normal in structure.  Mild mitral valve regurgitation.  5. The aortic valve is tricuspid. There is mild calcification of the aortic valve. Aortic valve regurgitation  is not visualized. Aortic valve sclerosis/calcification is present, without any evidence of aortic stenosis.  6. The inferior vena cava is normal in size with greater than 50% respiratory variability, suggesting right atrial pressure of 3 mmHg. Comparison(s): Prior images unable to be directly viewed, comparison made by report only. FINDINGS  Left Ventricle: Left ventricular ejection fraction, by estimation, is 55 to 60%. The left ventricle has normal function. The left ventricle has no regional wall motion abnormalities. The left ventricular internal cavity size was normal in size. There is  no left ventricular hypertrophy. Left ventricular diastolic parameters were normal. Right Ventricle: The right ventricular size is normal. No increase in right ventricular wall thickness. Right ventricular systolic function is normal. There is normal pulmonary artery systolic pressure. The tricuspid regurgitant velocity is 2.68 m/s, and  with an assumed right atrial pressure of 3 mmHg, the estimated right ventricular systolic pressure is 31.7 mmHg. Left Atrium: Left atrial size was moderately dilated. Right Atrium: Right atrial size was normal in size. Pericardium: There is no evidence of pericardial effusion. Mitral Valve: The mitral valve is normal in structure. Mild mitral valve regurgitation, with centrally-directed jet. Tricuspid Valve: The tricuspid valve is normal in structure. Tricuspid valve regurgitation is trivial. Aortic Valve: The aortic valve is tricuspid. There is mild calcification of the aortic valve. Aortic valve regurgitation is not visualized. Aortic valve sclerosis/calcification is present, without any evidence of aortic stenosis. Aortic valve mean gradient measures 11.0 mmHg. Aortic valve peak gradient measures 19.2 mmHg. Aortic valve area, by VTI measures 1.55  cm. Pulmonic Valve: The pulmonic valve was grossly normal. Pulmonic valve regurgitation is not visualized. No evidence of pulmonic stenosis. Aorta: The aortic root is normal in size and structure. Venous: The inferior vena cava is normal in size with greater than 50% respiratory variability, suggesting right atrial pressure of 3 mmHg. IAS/Shunts: No atrial level shunt detected by color flow Doppler.  LEFT VENTRICLE PLAX 2D LVIDd:         4.60 cm      Diastology LVIDs:         3.50 cm      LV e' medial:    10.70 cm/s LV PW:         1.10 cm      LV E/e' medial:  10.9 LV IVS:        1.10 cm      LV e' lateral:   10.30 cm/s LVOT diam:     1.90 cm      LV E/e' lateral: 11.3 LV SV:         58 LV SV Index:   32 LVOT Area:     2.84 cm  LV Volumes (MOD) LV vol d, MOD A2C: 100.0 ml LV vol d, MOD A4C: 110.0 ml LV vol s, MOD A2C: 39.1 ml LV vol s, MOD A4C: 37.7 ml LV SV MOD A2C:     60.9 ml LV SV MOD A4C:     110.0 ml LV SV MOD BP:      66.7 ml RIGHT VENTRICLE RV S prime:     15.50 cm/s TAPSE (M-mode): 2.3 cm LEFT ATRIUM             Index LA diam:        3.90 cm 2.15 cm/m LA Vol (A2C):   85.7 ml 47.20 ml/m LA Vol (A4C):   68.2 ml 37.56 ml/m LA Biplane Vol: 79.8 ml 43.95 ml/m  AORTIC VALVE AV Area (Vmax):  1.49 cm AV Area (Vmean):   1.37 cm AV Area (VTI):     1.55 cm AV Vmax:           219.00 cm/s AV Vmean:          152.000 cm/s AV VTI:            0.376 m AV Peak Grad:      19.2 mmHg AV Mean Grad:      11.0 mmHg LVOT Vmax:         115.00 cm/s LVOT Vmean:        73.200 cm/s LVOT VTI:          0.205 m LVOT/AV VTI ratio: 0.55  AORTA Ao Asc diam: 3.30 cm MITRAL VALVE                TRICUSPID VALVE MV Area (PHT): 5.36 cm     TR Peak grad:   28.7 mmHg MV Decel Time: 142 msec     TR Vmax:        268.00 cm/s MV E velocity: 116.50 cm/s MV A velocity: 72.20 cm/s   SHUNTS MV E/A ratio:  1.61         Systemic VTI:  0.20 m                             Systemic Diam: 1.90 cm Karyl Paget Croitoru MD Electronically signed by Luana Rumple  MD Signature Date/Time: 08/16/2023/4:41:50 PM    Final     Review of Systems  Constitutional:  Positive for activity change, appetite change and fatigue. Negative for fever.  HENT:  Negative for hearing loss, nosebleeds and sinus pressure.   Respiratory:  Positive for cough. Negative for chest tightness and shortness of breath.   Cardiovascular:  Positive for leg swelling. Negative for chest pain.  Gastrointestinal:  Negative for abdominal pain, constipation, diarrhea and nausea.       Negative when seen  Genitourinary:  Positive for frequency. Negative for dysuria and urgency.  Musculoskeletal:  Positive for myalgias. Negative for arthralgias.  Neurological:  Positive for weakness. Negative for dizziness and headaches.   Blood pressure (!) 125/45, pulse 72, temperature 97.8 F (36.6 C), temperature source Oral, resp. rate (!) 27, height 5\' 4"  (1.626 m), weight 76.1 kg, SpO2 99%. Physical Exam Vitals and nursing note reviewed.  Constitutional:      Appearance: She is obese. She is ill-appearing.  HENT:     Head: Normocephalic and atraumatic.     Right Ear: External ear normal.     Left Ear: External ear normal.     Mouth/Throat:     Mouth: Mucous membranes are dry.     Pharynx: Oropharynx is clear.  Eyes:     Extraocular Movements: Extraocular movements intact.     Conjunctiva/sclera: Conjunctivae normal.  Cardiovascular:     Rate and Rhythm: Normal rate and regular rhythm.     Pulses: Normal pulses.     Heart sounds: Normal heart sounds.  Pulmonary:     Breath sounds: Rales present. No wheezing.  Abdominal:     General: Bowel sounds are normal.     Palpations: Abdomen is soft.     Tenderness: There is abdominal tenderness.     Comments: Epigastric tenderness  Musculoskeletal:     Cervical back: Normal range of motion and neck supple.     Comments: Nonpitting lower extremity edema  Skin:    General: Skin  is warm and dry.     Coloration: Skin is pale.  Neurological:      General: No focal deficit present.     Mental Status: She is alert and oriented to person, place, and time.     Assessment/Plan: 1.  Hyponatremia: This appears to be multifactorial with preceding thiazide use (that likely confounds accurate measurement of urine sodium/osmolality), volume depletion and possibly SIADH from infection/respiratory illness.  Her sodium level is trending up slowly and will be rechecked again this evening to determine additional therapy including need for tolvaptan.  She appears to be euvolemic on physical exam status post initial intravenous volume expansion. 2.  Hypokalemia: This has been corrected with oral replacement. 3.  Left lower lobe pneumonia: On empiric antibiotic therapy with Rocephin and azithromycin. 4.  Hypertension: Blood pressures currently within acceptable range on metoprolol  monotherapy.  Agree with holding ARB at this time and stopping chlorthalidone  indefinitely (can use a long-acting loop diuretic in the future such as torsemide for pedal edema control).  Melodie Spry 08/17/2023, 6:19 PM

## 2023-08-17 NOTE — Evaluation (Signed)
 Occupational Therapy Evaluation Patient Details Name: Casey Bryan MRN: [id number] DOB: 1945/09/14 Today's Date: 08/17/2023   History of Present Illness   Patient is a 78 year old female who presented with abnormal labs. Patient was admitted with hyponatremia,possible pneumonia, paroxysmal a fib, hypokalemia, dehydration. PMH: DM II, GERD, hyperlipidemia, HTN.     Clinical Impressions Patient is a 78 year old female who was admitted for above. Patient was living at home with niece support prior level. Currently, patient needed light HHA for functional mobility in room and hallway. Patient noted to have increased edema in BLE with patient reporting this is chronic. Patient unable to bring feet to lap and bends with wheezing noted while O2 remains stable for LB Dressing tasks. Patient would benefit from continued skilled OT services to educate on AE for LB dressing/bathing tasks. Patient plans to return home with family support when medically stable.      If plan is discharge home, recommend the following:   A little help with walking and/or transfers;A little help with bathing/dressing/bathroom;Assistance with cooking/housework;Direct supervision/assist for medications management;Assist for transportation;Help with stairs or ramp for entrance;Direct supervision/assist for financial management     Functional Status Assessment   Patient has had a recent decline in their functional status and demonstrates the ability to make significant improvements in function in a reasonable and predictable amount of time.     Equipment Recommendations   None recommended by OT      Precautions/Restrictions   Restrictions Weight Bearing Restrictions Per Provider Order: No     Mobility Bed Mobility Overal bed mobility: Needs Assistance Bed Mobility: Supine to Sit     Supine to sit: Supervision     General bed mobility comments: for lines         Balance Overall balance assessment:  Needs assistance Sitting-balance support: No upper extremity supported, Feet supported Sitting balance-Leahy Scale: Good     Standing balance support: During functional activity, No upper extremity supported Standing balance-Leahy Scale: Fair         ADL either performed or assessed with clinical judgement   ADL Overall ADL's : Needs assistance/impaired Eating/Feeding: Modified independent   Grooming: Sitting;Set up   Upper Body Bathing: Sitting;Set up   Lower Body Bathing: Set up;Sitting/lateral leans Lower Body Bathing Details (indicate cue type and reason): can figure four BLE Upper Body Dressing : Sitting;Set up   Lower Body Dressing: Sitting/lateral leans;Set up   Toilet Transfer: Contact guard assist;Ambulation Toilet Transfer Details (indicate cue type and reason): HHA in hallway with some instability. Toileting- Clothing Manipulation and Hygiene: Contact guard assist;Sit to/from stand               Vision Patient Visual Report: No change from baseline              Pertinent Vitals/Pain Pain Assessment Pain Assessment: No/denies pain     Extremity/Trunk Assessment Upper Extremity Assessment Upper Extremity Assessment: Overall WFL for tasks assessed   Lower Extremity Assessment Lower Extremity Assessment: Generalized weakness   Cervical / Trunk Assessment Cervical / Trunk Assessment: Normal   Communication     Cognition Arousal: Alert Behavior During Therapy: WFL for tasks assessed/performed Cognition: No apparent impairments             OT - Cognition Comments: patients two sons were present during session as well.                 Following commands: Intact  Home Living Family/patient expects to be discharged to:: Private residence Living Arrangements: Other (Comment) (niece) Available Help at Discharge: Family;Available PRN/intermittently (niece is there at night.) Type of Home: House Home Access:  Stairs to enter [employer] of Steps: 3   Home Layout: One level     Bathroom Shower/Tub: Walk-in shower             Additional Comments: patient reported that husband who recently passed had all equipment needed at home.      Prior Functioning/Environment Prior Level of Function : Independent/Modified Independent                    OT Problem List: Impaired balance (sitting and/or standing);Decreased knowledge of precautions;Cardiopulmonary status limiting activity;Decreased safety awareness;Decreased knowledge of use of DME or AE;Decreased activity tolerance   OT Treatment/Interventions: Self-care/ADL training;DME and/or AE instruction;Therapeutic activities;Balance training;Therapeutic exercise;Energy conservation;Patient/family education      OT Goals(Current goals can be found in the care plan section)   Acute Rehab OT Goals Patient Stated Goal: to get back home OT Goal Formulation: With patient Time For Goal Achievement: 08/31/23 Potential to Achieve Goals: Fair   OT Frequency:  Min 1X/week    Co-evaluation   Reason for Co-Treatment: For patient/therapist safety PT goals addressed during session: Mobility/safety with mobility        AM-PAC OT "6 Clicks" Daily Activity     Outcome Measure Help from another person eating meals?: None Help from another person taking care of personal grooming?: None Help from another person toileting, which includes using toliet, bedpan, or urinal?: A Little Help from another person bathing (including washing, rinsing, drying)?: A Little Help from another person to put on and taking off regular upper body clothing?: None Help from another person to put on and taking off regular lower body clothing?: A Little 6 Click Score: 21   End of Session Nurse Communication: Mobility status  Activity Tolerance: Patient tolerated treatment well Patient left: in chair;with call bell/phone within reach;with chair alarm  set;with family/visitor present  OT Visit Diagnosis: Unsteadiness on feet (R26.81);Other abnormalities of gait and mobility (R26.89)                Time: 4098-1191 OT Time Calculation (min): 12 min Charges:  OT General Charges $OT Visit: 1 Visit OT Evaluation $OT Eval Low Complexity: 1 Low  Sherolyn Trettin OTR/L, MS Acute Rehabilitation Department Office# [phone]   Jame Maze 08/17/2023, 2:26 PM

## 2023-08-17 NOTE — Plan of Care (Signed)
  Problem: Clinical Measurements: Goal: Respiratory complications will improve Outcome: Progressing Goal: Cardiovascular complication will be avoided Outcome: Progressing   Problem: Activity: Goal: Risk for activity intolerance will decrease Outcome: Progressing   Problem: Nutrition: Goal: Adequate nutrition will be maintained Outcome: Progressing   Problem: Elimination: Goal: Will not experience complications related to urinary retention Outcome: Progressing   Problem: Skin Integrity: Goal: Risk for impaired skin integrity will decrease Outcome: Progressing

## 2023-08-17 NOTE — Progress Notes (Signed)
   Reviewed telemetry. Patient has been maintaining NSR. No recurrent atrial fibrillation noted. Echocardiogram from yesterday showed EF 55-60%, no wall motion abnormalities, normal RV systolic function, normal PA systolic pressure, mild MR. Continue metoprolol  tartrate 100 mg BID. Maintain K>4, mag>2.    Debria Fang, PA-C 08/17/2023 10:20 AM

## 2023-08-17 NOTE — Evaluation (Signed)
 Physical Therapy Evaluation Patient Details Name: Casey Bryan MRN: [id number] DOB: [birth year] Today's Date: 08/17/2023  History of Present Illness  Patient is a 78 year old female who presented with abnormal labs. Patient was admitted with hyponatremia,possible pneumonia, paroxysmal a fib, hypokalemia, dehydration. PMH: DM II, GERD, hyperlipidemia, HTN.  Clinical Impression  Pt admitted with above diagnosis.  Pt currently with functional limitations due to the deficits listed below (see PT Problem List). Pt will benefit from acute skilled PT to increase their independence and safety with mobility to allow discharge.    The patient is eager to ambulate today, tolerated x 220' with 1 Hand hold (lightly).  The patient should progress   to return home with family support.   Patient is independent at baseline and has a niece staying with  as well as sons are supportive.          If plan is discharge home, recommend the following: A little help with bathing/dressing/bathroom;Assistance with cooking/housework;Help with stairs or ramp for entrance;Assist for transportation   Can travel by private vehicle        Equipment Recommendations None recommended by PT  Recommendations for Other Services       Functional Status Assessment Patient has had a recent decline in their functional status and demonstrates the ability to make significant improvements in function in a reasonable and predictable amount of time.     Precautions / Restrictions Precautions Precautions: None Recall of Precautions/Restrictions: Intact Restrictions Weight Bearing Restrictions Per Provider Order: No      Mobility Bed Mobility Overal bed mobility: Needs Assistance Bed Mobility: Supine to Sit     Supine to sit: Supervision     General bed mobility comments: for lines    Transfers Overall transfer level: Needs assistance Equipment used: 1 person hand held assist Transfers: Sit to/from Stand Sit to  Stand: Contact guard assist                Ambulation/Gait Ambulation/Gait assistance: Min assist Gait Distance (Feet): 220 Feet Assistive device: 1 person hand held assist Gait Pattern/deviations: Step-through pattern, Drifts right/left Gait velocity: decr     General Gait Details: mildly drifts, less steady with no HHA for short distance  Stairs            Wheelchair Mobility     Tilt Bed    Modified Rankin (Stroke Patients Only)       Balance Overall balance assessment: Needs assistance Sitting-balance support: No upper extremity supported, Feet supported Sitting balance-Leahy Scale: Good     Standing balance support: During functional activity, No upper extremity supported Standing balance-Leahy Scale: Fair                               Pertinent Vitals/Pain Pain Assessment Pain Assessment: No/denies pain    Home Living Family/patient expects to be discharged to:: Private residence Living Arrangements: Other (Comment) (niece) Available Help at Discharge: Family;Available PRN/intermittently (niece is there at night.) Type of Home: House Home Access: Stairs to enter   [employer] of Steps: 3   Home Layout: One level   Additional Comments: patient reported that husband who recently passed had all equipment needed at home.    Prior Function Prior Level of Function : Independent/Modified Independent                     Extremity/Trunk Assessment   Upper Extremity Assessment Upper Extremity  Assessment: Overall WFL for tasks assessed    Lower Extremity Assessment Lower Extremity Assessment: Generalized weakness    Cervical / Trunk Assessment Cervical / Trunk Assessment: Normal  Communication        Cognition                                 Following commands: Intact       Cueing       General Comments      Exercises     Assessment/Plan    PT Assessment Patient needs continued  PT services  PT Problem List Decreased strength;Decreased activity tolerance;Decreased mobility       PT Treatment Interventions DME instruction;Therapeutic exercise;Gait training;Functional mobility training;Therapeutic activities;Patient/family education    PT Goals (Current goals can be found in the Care Plan section)  Acute Rehab PT Goals Patient Stated Goal: walk PT Goal Formulation: With patient/family Time For Goal Achievement: 08/31/23 Potential to Achieve Goals: Good    Frequency Min 3X/week     Co-evaluation PT/OT/SLP Co-Evaluation/Treatment: Yes Reason for Co-Treatment: For patient/therapist safety PT goals addressed during session: Mobility/safety with mobility         AM-PAC PT "6 Clicks" Mobility  Outcome Measure Help needed turning from your back to your side while in a flat bed without using bedrails?: None Help needed moving from lying on your back to sitting on the side of a flat bed without using bedrails?: None Help needed moving to and from a bed to a chair (including a wheelchair)?: A Little Help needed standing up from a chair using your arms (e.g., wheelchair or bedside chair)?: A Little Help needed to walk in hospital room?: A Little Help needed climbing 3-5 steps with a railing? : A Little 6 Click Score: 20    End of Session   Activity Tolerance: Patient tolerated treatment well Patient left: in chair;with call bell/phone within reach;with chair alarm set;with family/visitor present Nurse Communication: Mobility status PT Visit Diagnosis: Unsteadiness on feet (R26.81)    Time: 9604-5409 PT Time Calculation (min) (ACUTE ONLY): 19 min   Charges:   PT Evaluation $PT Eval Low Complexity: 1 Low   PT General Charges $$ ACUTE PT VISIT: 1 Visit         Abelina Hoes PT Acute Rehabilitation Services Office [phone] Weekend pager-[phone]   Dareen Ebbing 08/17/2023, 1:36 PM

## 2023-08-17 NOTE — Progress Notes (Addendum)
 Progress Note  Patient Name: Casey Bryan Date of Encounter: 08/17/2023  [facility] Cardiologist: Gaylyn Keas, MD      Subjective   Denies any SOB.  Still has some diarrhea.   Inpatient Medications    Scheduled Meds:  aspirin EC  81 mg Oral Daily   Chlorhexidine Gluconate Cloth  6 each Topical Daily   insulin aspart  0-15 Units Subcutaneous TID WC   insulin aspart  0-5 Units Subcutaneous QHS   metoprolol  tartrate  100 mg Oral BID   pantoprazole  40 mg Oral Daily   sodium chloride flush  3 mL Intravenous Q12H   Continuous Infusions:  azithromycin Stopped (08/16/23 2216)   cefTRIAXone (ROCEPHIN)  IV Stopped (08/17/23 0940)   PRN Meds: acetaminophen **OR** acetaminophen, diphenhydrAMINE, EPINEPHrine, guaiFENesin, hydrALAZINE, ipratropium-albuterol, labetalol, mouth rinse, polyethylene glycol, sodium chloride   Vital Signs    Vitals:   08/17/23 0600 08/17/23 0800 08/17/23 1000 08/17/23 1229  BP: (!) 130/49 (!) 142/55 (!) 132/56 (!) 117/55  Pulse: 73 76 69 66  Resp: (!) 22 (!) 23 (!) 23 19  Temp:  98 F (36.7 C)    TempSrc:  Oral    SpO2: 94% 95% 96% 100%  Weight:      Height:        Intake/Output Summary (Last 24 hours) at 08/17/2023 1247 Last data filed at 08/17/2023 1228 Gross per 24 hour  Intake 2342.18 ml  Output 1151 ml  Net 1191.18 ml      08/15/2023    8:14 PM 08/15/2023   10:54 AM 06/24/2023    3:30 PM  Last 3 Weights  Weight (lbs) 167 lb 12.3 oz 171 lb 9.6 oz 173 lb 8 oz  Weight (kg) 76.1 kg 77.837 kg 78.699 kg      Telemetry    NSR - Personally Reviewed  ECG    No new EKG to review - Personally Reviewed  Physical Exam   GEN: No acute distress.   Neck: No JVD Cardiac: RRR, no murmurs, rubs, or gallops.  Respiratory: Crackles at left base GI: Soft, nontender, non-distended  MS: No edema; No deformity.  Still tender to palpation over the left chest wall and left axilla Neuro:  Nonfocal  Psych: Normal affect   Labs    High  Sensitivity Troponin:   Recent Labs  Lab 08/15/23 2214  TROPONINIHS 8      Chemistry Recent Labs  Lab 08/15/23 1349 08/15/23 1349 08/15/23 1538 08/15/23 2214 08/16/23 0840 08/16/23 1303 08/16/23 1654 08/16/23 2117 08/17/23 0301  NA 115*  --  115* 120* 120*   < > 120* 122* 122*  K 3.2*  --  2.9* 2.8* 3.4*  --   --   --  4.0  CL 77*  --  76* 83* 87*  --   --   --  92*  CO2 27  --  25 24 21*  --   --   --  23  GLUCOSE 135*  --  156* 144* 159*  --   --   --  155*  BUN 16  --  16 15 14   --   --   --  16  CREATININE 1.00  --  1.24* 1.06* 0.85  --   --   --  1.18*  CALCIUM  8.2*  --  9.0 8.2* 8.1*  --   --   --  8.0*  PROT 6.3  --  7.0  --   --   --   --   --   --  ALBUMIN 3.5  --  4.0  --   --   --   --   --   --   AST 35  --  51*  --   --   --   --   --   --   ALT 19  --  28  --   --   --   --   --   --   ALKPHOS 44  --  61  --   --   --   --   --   --   BILITOT 0.9  --  0.8  --   --   --   --   --   --   GFRNONAA  --    < > 45* 54* >60  --   --   --  48*  ANIONGAP  --    < > 14 13 12   --   --   --  7   < > = values in this interval not displayed.     Hematology Recent Labs  Lab 08/15/23 1538 08/16/23 0840 08/17/23 0301  WBC 9.3 8.5 6.3  RBC 3.76* 3.38* 3.03*  HGB 10.8* 9.7* 8.7*  HCT 30.0* 27.3* 25.2*  MCV 79.8* 80.8 83.2  MCH 28.7 28.7 28.7  MCHC 36.0 35.5 34.5  RDW 13.6 13.7 14.3  PLT 192 187 195    BNP Recent Labs  Lab 08/16/23 0940  BNP 582.2*     DDimer No results for input(s): "DDIMER" in the last 168 hours.   CHA2DS2-VASc Score = 5   This indicates a 7.2% annual risk of stroke. The patient's score is based upon: CHF History: 0 HTN History: 1 Diabetes History: 1 Stroke History: 0 Vascular Disease History: 0 Age Score: 2 Gender Score: 1      Radiology    ECHOCARDIOGRAM COMPLETE Result Date: 08/16/2023    ECHOCARDIOGRAM REPORT   Patient Name:   Casey Bryan Date of Exam: 08/16/2023 Medical Rec #:  [id number]     Height:       64.0 in  Accession #:    [id number]    Weight:       167.8 lb Date of Birth:  25-May-1945      BSA:          1.816 m Patient Age:    77 years      BP:           174/51 mmHg Patient Gender: F             HR:           85 bpm. Exam Location:  Inpatient Procedure: 2D Echo, Cardiac Doppler and Color Doppler (Both Spectral and Color            Flow Doppler were utilized during procedure). Indications:    I48.91* Unspeicified atrial fibrillation  History:        Patient has prior history of Echocardiogram examinations, most                 recent 04/09/2015. Arrythmias:Atrial Fibrillation; Risk                 Factors:Hypertension and Diabetes.  Sonographer:    Andrena Bang Referring Phys: [id number] [facility] GOEL IMPRESSIONS  1. Left ventricular ejection fraction, by estimation, is 55 to 60%. The left ventricle has normal function. The left ventricle has no regional wall motion abnormalities. Left ventricular diastolic parameters were normal.  2. Right ventricular systolic function is normal. The right ventricular size is normal. There is normal pulmonary artery systolic pressure. The estimated right ventricular systolic pressure is 31.7 mmHg.  3. Left atrial size was moderately dilated.  4. The mitral valve is normal in structure. Mild mitral valve regurgitation.  5. The aortic valve is tricuspid. There is mild calcification of the aortic valve. Aortic valve regurgitation is not visualized. Aortic valve sclerosis/calcification is present, without any evidence of aortic stenosis.  6. The inferior vena cava is normal in size with greater than 50% respiratory variability, suggesting right atrial pressure of 3 mmHg. Comparison(s): Prior images unable to be directly viewed, comparison made by report only. FINDINGS  Left Ventricle: Left ventricular ejection fraction, by estimation, is 55 to 60%. The left ventricle has normal function. The left ventricle has no regional wall motion abnormalities. The left ventricular internal cavity size was  normal in size. There is  no left ventricular hypertrophy. Left ventricular diastolic parameters were normal. Right Ventricle: The right ventricular size is normal. No increase in right ventricular wall thickness. Right ventricular systolic function is normal. There is normal pulmonary artery systolic pressure. The tricuspid regurgitant velocity is 2.68 m/s, and  with an assumed right atrial pressure of 3 mmHg, the estimated right ventricular systolic pressure is 31.7 mmHg. Left Atrium: Left atrial size was moderately dilated. Right Atrium: Right atrial size was normal in size. Pericardium: There is no evidence of pericardial effusion. Mitral Valve: The mitral valve is normal in structure. Mild mitral valve regurgitation, with centrally-directed jet. Tricuspid Valve: The tricuspid valve is normal in structure. Tricuspid valve regurgitation is trivial. Aortic Valve: The aortic valve is tricuspid. There is mild calcification of the aortic valve. Aortic valve regurgitation is not visualized. Aortic valve sclerosis/calcification is present, without any evidence of aortic stenosis. Aortic valve mean gradient measures 11.0 mmHg. Aortic valve peak gradient measures 19.2 mmHg. Aortic valve area, by VTI measures 1.55 cm. Pulmonic Valve: The pulmonic valve was grossly normal. Pulmonic valve regurgitation is not visualized. No evidence of pulmonic stenosis. Aorta: The aortic root is normal in size and structure. Venous: The inferior vena cava is normal in size with greater than 50% respiratory variability, suggesting right atrial pressure of 3 mmHg. IAS/Shunts: No atrial level shunt detected by color flow Doppler.  LEFT VENTRICLE PLAX 2D LVIDd:         4.60 cm      Diastology LVIDs:         3.50 cm      LV e' medial:    10.70 cm/s LV PW:         1.10 cm      LV E/e' medial:  10.9 LV IVS:        1.10 cm      LV e' lateral:   10.30 cm/s LVOT diam:     1.90 cm      LV E/e' lateral: 11.3 LV SV:         58 LV SV Index:   32 LVOT  Area:     2.84 cm  LV Volumes (MOD) LV vol d, MOD A2C: 100.0 ml LV vol d, MOD A4C: 110.0 ml LV vol s, MOD A2C: 39.1 ml LV vol s, MOD A4C: 37.7 ml LV SV MOD A2C:     60.9 ml LV SV MOD A4C:     110.0 ml LV SV MOD BP:      66.7 ml RIGHT VENTRICLE RV S prime:  15.50 cm/s TAPSE (M-mode): 2.3 cm LEFT ATRIUM             Index LA diam:        3.90 cm 2.15 cm/m LA Vol (A2C):   85.7 ml 47.20 ml/m LA Vol (A4C):   68.2 ml 37.56 ml/m LA Biplane Vol: 79.8 ml 43.95 ml/m  AORTIC VALVE AV Area (Vmax):    1.49 cm AV Area (Vmean):   1.37 cm AV Area (VTI):     1.55 cm AV Vmax:           219.00 cm/s AV Vmean:          152.000 cm/s AV VTI:            0.376 m AV Peak Grad:      19.2 mmHg AV Mean Grad:      11.0 mmHg LVOT Vmax:         115.00 cm/s LVOT Vmean:        73.200 cm/s LVOT VTI:          0.205 m LVOT/AV VTI ratio: 0.55  AORTA Ao Asc diam: 3.30 cm MITRAL VALVE                TRICUSPID VALVE MV Area (PHT): 5.36 cm     TR Peak grad:   28.7 mmHg MV Decel Time: 142 msec     TR Vmax:        268.00 cm/s MV E velocity: 116.50 cm/s MV A velocity: 72.20 cm/s   SHUNTS MV E/A ratio:  1.61         Systemic VTI:  0.20 m                             Systemic Diam: 1.90 cm Karyl Paget Croitoru MD Electronically signed by Luana Rumple MD Signature Date/Time: 08/16/2023/4:41:50 PM    Final    CT ABDOMEN PELVIS W CONTRAST Result Date: 08/15/2023 CLINICAL DATA:  Abdominal pain. EXAM: CT ABDOMEN AND PELVIS WITH CONTRAST TECHNIQUE: Multidetector CT imaging of the abdomen and pelvis was performed using the standard protocol following bolus administration of intravenous contrast. RADIATION DOSE REDUCTION: This exam was performed according to the departmental dose-optimization program which includes automated exposure control, adjustment of the mA and/or kV according to patient size and/or use of iterative reconstruction technique. CONTRAST:  100mL OMNIPAQUE IOHEXOL 300 MG/ML  SOLN COMPARISON:  CT abdomen pelvis dated 10/27/2022. FINDINGS: Lower  chest: Patchy area of airspace opacity in the visualized left lower lobe concerning for pneumonia or aspiration. Clinical correlation and follow-up to resolution recommended. Trace pericardial effusion. No intra-abdominal free air or free fluid. Hepatobiliary: The liver is unremarkable. No biliary dilatation. The gallbladder is unremarkable. Pancreas: Unremarkable. No pancreatic ductal dilatation or surrounding inflammatory changes. Spleen: Normal in size without focal abnormality. Adrenals/Urinary Tract: The adrenal glands are unremarkable. Right renal parapelvic cyst. There is no hydronephrosis on either side. There is symmetric enhancement and excretion of contrast by both kidneys. The visualized ureters and urinary bladder appear unremarkable. Stomach/Bowel: There is no bowel obstruction or active inflammation. The appendix is normal. Vascular/Lymphatic: Advanced aortoiliac atherosclerotic disease. The IVC is unremarkable. No portal venous gas. There is no adenopathy. Reproductive: The uterus is grossly unremarkable. No suspicious adnexal masses. Other: None Musculoskeletal: Osteopenia with degenerative changes of the spine. No acute osseous pathology. IMPRESSION: 1. No acute intra-abdominal or pelvic pathology. 2. Left lower lobe pneumonia or aspiration. 3.  Aortic Atherosclerosis (  ICD10-I70.0). Electronically Signed   By: Angus Bark M.D.   On: 08/15/2023 17:32   DG Chest Portable 1 View Result Date: 08/15/2023 CLINICAL DATA:  Short of breath EXAM: PORTABLE CHEST 1 VIEW COMPARISON:  09/10/2003 FINDINGS: Single frontal view of the chest demonstrates mild enlargement the cardiac silhouette. There is increased pulmonary vascular congestion and interstitial prominence, compatible with fluid overload and interstitial edema. No acute airspace disease, effusion, or pneumothorax. No acute bony abnormalities. IMPRESSION: 1. Constellation of findings most consistent with mild congestive heart failure and  developing interstitial edema. Electronically Signed   By: Bobbye Burrow M.D.   On: 08/15/2023 16:50    Patient Profile     78 y.o. female with a hx of GERD, HTN, HLD, type 2 DM who is being seen 08/16/2023 for the evaluation of atrial fibrillation at the request of Dr. Duard Getting.   Assessment & Plan    New onset atrial fibrillation with RVR Hyponatremia Hypokalemia Hypomagnesemia After further review of EKG her admitting EKG showed NSR EKG on 4/28 clearly showed atrial fibrillation and converted at 3 AM 4/29 to sinus rhythm Remains in NSR today. Suspect A-fib occurred in the setting of profound electrolyte abnormalities with potassium 2.8, magnesium  0.9 and sodium 115 in the setting of UTI and pneumonia as well as prior diuretic use with chlorthalidone , nausea and vomiting and diarrhea Urine osmolality 261 and serum osmolality low at 250 diagnostic of SIADH Now on fluid restriction and IV fluids have stopped TSH normal at 0.794 Nausea vomiting diarrhea have stopped Replete potassium to keep >4 and mag >2 Hyponatremia initially felt to be prerenal given recent episodes of nausea and vomiting and reduced p.o. intake recently and slight improvement with IV fluids but now sodium has plateaued at 122 and urine/serum osmolality consistent with SIADH TRH is starting tolvaptan and nephrology has been consulted 2D echo 4/29 showed EF 55 to 60% with normal diastolic function, normal RV function, moderate LAE, mild MR and mild calcification of the aortic valve with no AAS. She has not had any further atrial arrhythmias on Toprol  XL 100 mg twice daily Her CHA2DS2-VASc score is 14 (age greater than 37, female, hypertension, diabetes mellitus) but given that she only had 1 episode of atrial fibrillation that was transient in the setting of marked metabolic derangement with profound hypokalemia would not start on anticoagulation unless she has further episodes. Recommend outpatient 30-day event monitor to  assess for A-fib   Acute respiratory failure Questionable acute CHF Pneumonia Chest x-ray showed findings suspicious for mild congestive heart failure and BNP elevated at 582.  CT showed left lower lobe pneumonia and no evidence of edema >> BNP likely elevated due to pneumonia She does not appear volume overloaded on exam and has not had any recent shortness of breath, PND orthopnea  She does have chronic lower extremity edema that is nonpitting and was on diuretics at home for this   AKI Serum creatinine on admission 1.24 but has improved to 0.85 with IV fluid hydration Felt to be prerenal related to recent decreased p.o. intake and nausea and vomiting   Hypertension BP BP 121/51 mmHg today Chlorthalidone , Avapro  and Lopressor  were held on admission Continue PTA Lopressor  100 mg twice daily   [facility] will sign off.   Medication Recommendations: Lopressor  100 mg twice daily.  Further diuretics to be determined by nephrology Other recommendations (labs, testing, etc): 30-day event monitor to assess for A-fib Follow up as an outpatient: TOC follow-up in 2  to 3 weeks  For questions or updates, please contact [facility] Please consult [email or web address] for contact info under        Signed, Gaylyn Keas, MD  08/17/2023, 12:47 PM

## 2023-08-17 NOTE — Progress Notes (Signed)
 PROGRESS NOTE  [name]  DOB: 03-19-1946  PCP: Ezell Hollow, MD ZOX:[id number]  DOA: 08/15/2023  LOS: 2 days  Hospital Day: 3  Brief narrative: Casey Bryan is a 78 y.o. female with PMH significant for DM2, HTN, HLD, TIA (amaurosis fugax), glaucoma, GERD 4/28, patient was sent to the ED by PCP for significant low sodium level of 115.  Patient reports fatigue, urinary symptoms, low-grade fever, headache for few days followed by vomiting and diarrhea and hence went to see her PCP.   Labs in the ED confirmed low sodium level Admitted to [facility]   Subjective: Patient was seen and examined this morning.  Present elderly female, sitting up in recliner.  Not in distress. Family at bedside and on the phone. Patient complains of cough since last night. On fluid restriction Chart reviewed. No fever, heart rate in 70s, breathing low 20s, blood pressure 120s and 130s mostly Labs from this morning with WBC count 6.3, hemoglobin 8.7, platelet 195, sodium 122 creatinine 1.18 Echo with EF 55 to 60%  Assessment and plan: Acute hyponatremia -likely SIADH Baseline sodium level normal from 2024  Presented with low sodium level of 115 in the setting of nausea vomiting diarrhea, poor oral intake, was also on chlorthalidone  Serum osmolality low at 250, urine osmolality 261, urine sodium level 30. Cortisol level pending Urine osmolality more than 100 in the setting of low serum osmolality is diagnostic of SIADH It seems patient was given IV hydration with some improvement in sodium level which is now plateaued at 122 Nausea vomiting has improved.  Last bowel movement was last night and was normal. Has chronic bilateral lower extremity nonpitting edema. I would avoid further IV hydration.  I believe patient would benefit from a trial of tolvaptan. Will consult nephrology Recent Labs  Lab 08/15/23 1349 08/15/23 1538 08/15/23 2214 08/16/23 0840 08/16/23 1303 08/16/23 1654 08/16/23 2117  08/17/23 0301  NA 115* 115* 120* 120* 118* 120* 122* 122*   Left lower lobe pneumonia Seen in CT scan from 4/28 No respiratory issues WBC normal, procalcitonin slightly elevated Currently on empiric treatment with IV Rocephin and azithromycin Blood culture has not shown any growth so far Recent Labs  Lab 08/15/23 1349 08/15/23 1538 08/16/23 0840 08/17/23 0301  WBC 9.7 9.3 8.5 6.3  PROCALCITON  --   --  0.32  --     New onset A-fib with RVR A-fib occurred in the setting of profound electrolyte abnormalities  PTA meds- metoprolol  tartrate 100 mg twice daily which was held on admission.  A-fib improved after metoprolol  was resumed.   Currently normal sinus rhythm  Not on anticoagulation  Cardiology consult appreciated  Hypertension Chronic bilateral pedal edema PTA meds- metoprolol  tartrate 100 mg twice daily, chlorthalidone  25 mg daily, Avapro  300 mg daily Currently resumed on metoprolol . Has chronic bilateral pedal edema.  Chlorthalidone  on hold Echocardiogram 4/29 showed EF of 55 to 60%  AKI Baseline creatinine of 0.9-1. Mild AKI on admission with creatinine up to 1.24 Improving Recent Labs    02/21/23 1420 06/24/23 1557 08/15/23 1349 08/15/23 1538 08/15/23 2214 08/16/23 0840 08/17/23 0301  BUN 19 15 16 16 15 14 16   CREATININE 1.05 0.93 1.00 1.24* 1.06* 0.85 1.18*   Type 2 diabetes mellitus A1c controlled at 6 on 06/24/2023 PTA meds- metformin  1000 mg twice daily, currently on hold Currently on SSI/Accu-Cheks Recent Labs  Lab 08/16/23 1112 08/16/23 1614 08/16/23 2151 08/17/23 0820 08/17/23 1232  GLUCAP 169* 119* 178* 127*  138*   H/o TIA HLD Continue aspirin, statin  Mild chronic anemia GERD Continue PPI  Hemoglobin downtrending likely dilutional.  No active bleeding Recent Labs    10/20/22 1525 02/21/23 1420 06/24/23 1557 08/15/23 1349 08/15/23 1538 08/16/23 0840 08/17/23 0301  HGB 10.8* 10.6* 11.5* 11.0* 10.8* 9.7* 8.7*  MCV 85.9  --  88.9  83.2 79.8* 80.8 83.2  FERRITIN 31 15.5  --   --   --   --   --   TIBC 332  --   --   --   --   --   --   IRON 57 48  --   --   --   --   --       Mobility: Encourage ambulation.  Goals of care   Code Status: Full Code  Pending PT eval   DVT prophylaxis:  SCDs Start: 08/15/23 2139   Antimicrobials: Empiric antibiotics.  Okay to challenge penicillin oral today as planned by pharmacy. Fluid: Fluid restriction Consultants: Nephrology, cardiology Family Communication: Multiple family members at bedside  Status: Inpatient Level of care:  Telemetry.  Okay to transfer to med/tele today  Patient is from: Home Needs to continue in-hospital care: Needs improvement in sodium level Anticipated d/c to: Hopefully home in 1 to 2 days    Diet:  Diet Order             Diet regular Room service appropriate? Yes; Fluid consistency: Thin; Fluid restriction: 1200 mL Fluid  Diet effective now                   Scheduled Meds:  aspirin EC  81 mg Oral Daily   Chlorhexidine Gluconate Cloth  6 each Topical Daily   insulin aspart  0-15 Units Subcutaneous TID WC   insulin aspart  0-5 Units Subcutaneous QHS   metoprolol  tartrate  100 mg Oral BID   pantoprazole  40 mg Oral Daily   sodium chloride flush  3 mL Intravenous Q12H    PRN meds: acetaminophen **OR** acetaminophen, diphenhydrAMINE, EPINEPHrine, guaiFENesin, hydrALAZINE, ipratropium-albuterol, labetalol, mouth rinse, polyethylene glycol, sodium chloride   Infusions:   azithromycin Stopped (08/16/23 2216)   cefTRIAXone (ROCEPHIN)  IV Stopped (08/17/23 0940)    Antimicrobials: Anti-infectives (From admission, onward)    Start     Dose/Rate Route Frequency Ordered Stop   08/17/23 1300  amoxicillin (AMOXIL) capsule 500 mg        500 mg Oral  Once 08/17/23 1213 08/17/23 1223   08/16/23 1000  cefTRIAXone (ROCEPHIN) 1 g in sodium chloride 0.9 % 100 mL IVPB        1 g 200 mL/hr over 30 Minutes Intravenous Every 24 hours  08/15/23 2115 08/21/23 0959   08/15/23 2200  azithromycin (ZITHROMAX) 500 mg in sodium chloride 0.9 % 250 mL IVPB       Note to Pharmacy: She does not have a true allergy to clarithromycin . It would interac with a previous medication (vytorin ) that she was on.   500 mg 250 mL/hr over 60 Minutes Intravenous Every 24 hours 08/15/23 2115 08/18/23 2159   08/15/23 2200  metroNIDAZOLE (FLAGYL) IVPB 500 mg  Status:  Discontinued        500 mg 100 mL/hr over 60 Minutes Intravenous Every 12 hours 08/15/23 2123 08/16/23 1054   08/15/23 1815  cefTRIAXone (ROCEPHIN) 1 g in sodium chloride 0.9 % 100 mL IVPB        1 g 200 mL/hr over 30  Minutes Intravenous  Once 08/15/23 1800 08/15/23 1835       Objective: Vitals:   08/17/23 1000 08/17/23 1229  BP: (!) 132/56 (!) 117/55  Pulse: 69 66  Resp: (!) 23 19  Temp:    SpO2: 96% 100%    Intake/Output Summary (Last 24 hours) at 08/17/2023 1242 Last data filed at 08/17/2023 1228 Gross per 24 hour  Intake 2342.18 ml  Output 1151 ml  Net 1191.18 ml   Filed Weights   08/15/23 2014  Weight: 76.1 kg   Weight change:  Body mass index is 28.8 kg/m.   Physical Exam: General exam: Pleasant, elderly female.  Not in distress Skin: No rashes, lesions or ulcers. HEENT: Atraumatic, normocephalic, no obvious bleeding Lungs: Mild bibasilar rales.  Coughs on deep breathing CVS: S1, S2, no murmur,   GI/Abd: Soft, nontender, nondistended, bowel sound present,   CNS: Alert, awake, oriented x 3 Psychiatry: Mood appropriate,  Extremities: Chronic bilateral nonpitting pedal edema, no calf tenderness,   Data Review: I have personally reviewed the laboratory data and studies available.  F/u labs ordered Unresulted Labs (From admission, onward)     Start     Ordered   08/17/23 0500  Basic metabolic panel with GFR  Daily,   R     Question:  Specimen collection method  Answer:  Lab=Lab collect   08/16/23 1239   08/16/23 1300  Sodium  2 times daily,   R (with  TIMED occurrences)     Question:  Specimen collection method  Answer:  Lab=Lab collect   08/16/23 1239   08/16/23 0012  MRSA Next Gen by PCR, Nasal  Once,   R        08/16/23 0011            Admission date and time: 08/15/2023  3:44 PM    Signed, Hoyt Macleod, MD [facility] 08/17/2023

## 2023-08-17 NOTE — Progress Notes (Addendum)
 Pharmacy Note- Penicillin Allergy Clarification   ASSESSMENT: Patient reports hives reaction occurring a short time after a single dose of a penicillin antibiotic in childhood. She has not received any penicillins since then. She did not require treatment for this reaction. Discussed oral amoxicillin challenge for de-labeling of allergy, and she is amenable. Discussed with Dr. Dahal. Will coordinate with patient's RN.   PEN-FAST Scoring   Five years or less since last reaction 0  Anaphylaxis/Angioedema OR Severe cutaneous adverse reaction  0  Treatment required for reaction  0  Total Score 0 points - Very low risk of positive penicillin allergy test (<1%)      Type of intervention (select all that apply):  Amoxicillin Oral Challenge  Impact on therapy (select all that apply):  Antibiotic de-escalation   PLAN: - amoxicillin 500 mg PO x1  - Q15min vitals monitoring x 1 hour  - Epi-Pen PRN  - IV diphenhydramine PRN  - plan communicated to primary RN   POST-CHALLENGE FOLLOW-UP:

## 2023-08-18 DIAGNOSIS — E871 Hypo-osmolality and hyponatremia: Secondary | ICD-10-CM | POA: Diagnosis not present

## 2023-08-18 LAB — BASIC METABOLIC PANEL WITH GFR
Anion gap: 9 (ref 5–15)
BUN: 14 mg/dL (ref 8–23)
CO2: 24 mmol/L (ref 22–32)
Calcium: 8.7 mg/dL — ABNORMAL LOW (ref 8.9–10.3)
Chloride: 94 mmol/L — ABNORMAL LOW (ref 98–111)
Creatinine, Ser: 1.04 mg/dL — ABNORMAL HIGH (ref 0.44–1.00)
GFR, Estimated: 56 mL/min — ABNORMAL LOW (ref >60)
Glucose, Bld: 133 mg/dL — ABNORMAL HIGH (ref 70–99)
Potassium: 3.5 mmol/L (ref 3.5–5.1)
Sodium: 127 mmol/L — ABNORMAL LOW (ref 135–145)

## 2023-08-18 LAB — GLUCOSE, CAPILLARY
Glucose-Capillary: 131 mg/dL — ABNORMAL HIGH (ref 70–99)
Glucose-Capillary: 201 mg/dL — ABNORMAL HIGH (ref 70–99)
Glucose-Capillary: 121 mg/dL — ABNORMAL HIGH (ref 70–99)

## 2023-08-18 LAB — SODIUM: Sodium: 129 mmol/L — ABNORMAL LOW (ref 135–145)

## 2023-08-18 NOTE — Progress Notes (Signed)
 Patient is stable,with no changes from am assessment. Discharge instructions reviewed with the patient and her daughter.Questions,or concerns were denied at this time.

## 2023-08-18 NOTE — Progress Notes (Addendum)
 [facility] Progress Note  Subjective:  Early am Na+ was 127 This afternoon, Na+ up to 129  Vitals:   08/17/23 1949 08/18/23 0538 08/18/23 1057 08/18/23 1204  BP: (!) 150/70 (!) 139/58 (!) 133/55 (!) 123/58  Pulse: 84 71 74 63  Resp: 20 20 20    Temp: 98.4 F (36.9 C) 98.2 F (36.8 C) (!) 97.5 F (36.4 C) 98.3 F (36.8 C)  TempSrc: Oral Oral  Oral  SpO2: 98% 95% 97% 96%  Weight:      Height:        Exam:  alert, nad   no jvd  Chest cta bilat  Cor reg no RG  Abd soft ntnd no ascites   Ext no LE edema, has puffy ankles but no edema   Alert, NF, ox3    Assessment/ Plan: Hyponatremia: This appears to be multifactorial with preceding thiazide use and volume depletion. Possibly viral resp illness causing SIADH as well. She had 1-2 days of n/v/d prior to admission. Na+ 115 on admit improved over 3 days of isotonic fluids up to 125. Last night she rec'd tolvaptan  15mg  x 1 around 9 pm for suspected SIADH type picture and this am Na+ was up to 127. Repeat lab at 1 pm is Na+ 129. Will sign off. Recommend: - ok for discharge - avoid hydrochlorothiazide  and chlorthalidone  for HTN in the future - hold the losartan  for 1-2 wks then can resume - her puffy lower legs are NOT edema, she just has puffy lower legs and should not be diuresed for this - if possible have her PCP check labs in 1-2 weeks    Hypokalemia: corrected with oral replacement. Left lower lobe pneumonia: On empiric antibiotic therapy with Rocephin  and azithromycin . Hypertension: Blood pressures currently within acceptable range on metoprolol  monotherapy.  Rec's as above.      Rob Reona Zendejas MD  [facility] 08/18/2023, 12:30 PM  Recent Labs  Lab 08/15/23 1538 08/15/23 2214 08/16/23 0840 08/17/23 0301 08/17/23 2100 08/18/23 0541  HGB 10.8*  --  9.7* 8.7*  --   --   ALBUMIN 4.0  --   --   --  2.3*  --   CALCIUM  9.0   < > 8.1* 8.0*  --  8.7*  CREATININE 1.24*   < > 0.85 1.18*  --  1.04*  K 2.9*   < > 3.4*  4.0  --  3.5   < > = values in this interval not displayed.   No results for input(s): "IRON", "TIBC", "FERRITIN" in the last 168 hours. Inpatient medications:  amoxicillin -clavulanate  1 tablet Oral Q12H   aspirin  EC  81 mg Oral Daily   Chlorhexidine  Gluconate Cloth  6 each Topical Daily   insulin  aspart  0-15 Units Subcutaneous TID WC   insulin  aspart  0-5 Units Subcutaneous QHS   metoprolol  tartrate  100 mg Oral BID   pantoprazole   40 mg Oral Daily   sodium chloride  flush  3 mL Intravenous Q12H    acetaminophen  **OR** acetaminophen , diphenhydrAMINE , EPINEPHrine , guaiFENesin , hydrALAZINE , ipratropium-albuterol , labetalol , mouth rinse, polyethylene glycol, sodium chloride 

## 2023-08-18 NOTE — Discharge Summary (Signed)
 Physician Discharge Summary  Casey Bryan ZHY:[id number] DOB: 1945/11/22 DOA: 08/15/2023  PCP: Ezell Hollow, MD  Admit date: 08/15/2023 Discharge date: 08/18/2023  Admitted From: home Discharge disposition: home  Recommendations at discharge:  Stop chlorthalidone . Hold atorvastatin  and Avapro  till f/u with PCP Advise follow up with PCP next week. Repeat CBC, CMP with PCP next week.   Brief narrative: Casey Bryan is a 78 y.o. female with PMH significant for DM2, HTN, HLD, TIA (amaurosis fugax), glaucoma, GERD 4/28, patient was sent to the ED by PCP for significant low sodium level of 115.  Patient reports fatigue, urinary symptoms, low-grade fever, headache for few days followed by vomiting and diarrhea and hence went to see her PCP.   Labs in the ED confirmed low sodium level Admitted to [facility]  Subjective: Patient was seen and examined this morning.  Lying on bed.  Not in distress.  Family at bedside and on the phone.  Cough improving. Sodium level improving.  Received 1 dose of tolvaptan  yesterday. LFTs up today.  Hospital course: Acute hyponatremia -likely SIADH Baseline sodium level normal from 2024  Presented with low sodium level of 115 in the setting of nausea vomiting diarrhea, poor oral intake, was also on chlorthalidone  Serum osmolality low at 250, urine osmolality 261, urine sodium level 30. Cortisol level normal Urine osmolality more than 100 in the setting of low serum osmolality suggestive of SIADH Sodium level improved initially with IV hydration and plateaued at 122 4/30, nephrology was consulted.  1 dose of tolvaptan  was given.  Improvement in urine trend as below, 129 this afternoon. Able to tolerate oral diet. Repeat sodium with PCP next week. Recent Labs  Lab 08/15/23 2214 08/16/23 0840 08/16/23 1303 08/16/23 1654 08/16/23 2117 08/17/23 0301 08/17/23 1300 08/17/23 1917 08/18/23 0541 08/18/23 1248  NA 120* 120* 118* 120* 122* 122* 125* 125* 127*  129*   Left lower lobe pneumonia Seen in CT scan from 4/28 No respiratory issues WBC normal, procalcitonin was slightly elevated Blood culture has not shown any growth so far Cough improving. Completed the course of antibiotics in the hospital. Recent Labs  Lab 08/15/23 1349 08/15/23 1538 08/16/23 0840 08/17/23 0301  WBC 9.7 9.3 8.5 6.3  PROCALCITON  --   --  0.32  --     New onset A-fib with RVR A-fib occurred in the setting of profound electrolyte abnormalities  PTA meds- metoprolol  tartrate 100 mg twice daily which was held on admission.  A-fib improved after metoprolol  was resumed.   Currently normal sinus rhythm  Not on anticoagulation  Cardiology consult appreciated  Hypertension Chronic bilateral pedal edema PTA meds- metoprolol  tartrate 100 mg twice daily, chlorthalidone  25 mg daily, Avapro  300 mg daily Currently resumed on metoprolol . Has chronic bilateral nonpitting pedal edema. Likely no benefit from chronic diuretic use. Given low sodium level, chlorthalidone  to be held permanently.  Hold Avapro  till f/u with PCP next week. Echocardiogram 4/29 showed EF of 55 to 60%  AKI Baseline creatinine of 0.9-1. Mild AKI on admission with creatinine up to 1.24 Improving Recent Labs    02/21/23 1420 06/24/23 1557 08/15/23 1349 08/15/23 1538 08/15/23 2214 08/16/23 0840 08/17/23 0301 08/18/23 0541  BUN 19 15 16 16 15 14 16 14   CREATININE 1.05 0.93 1.00 1.24* 1.06* 0.85 1.18* 1.04*   Elevated LFTs AST and ALT up up in last 2 days.  Normal alk phos and bilirubin.  ?? Unclear etiology. Chronically on statin. Held statin today. Repeat LFTs  with PCP next week. CT abdomen from 4/28 did not show any abnormality liver parenchyma Recent Labs  Lab 08/15/23 1349 08/15/23 1538 08/17/23 2100  AST 35 51* 98*  ALT 19 28 66*  ALKPHOS 44 61 44  BILITOT 0.9 0.8 0.5  PROT 6.3 7.0 5.6*  ALBUMIN 3.5 4.0 2.3*    Type 2 diabetes mellitus A1c controlled at 6 on 06/24/2023 PTA  meds- metformin  1000 mg twice daily. Resume post discharge. Recent Labs  Lab 08/17/23 1751 08/17/23 2011 08/18/23 0726 08/18/23 1309 08/18/23 1622  GLUCAP 126* 192* 131* 201* 121*   H/o TIA HLD Continue aspirin  statin on hold due to elevated LFTs  Mild chronic anemia GERD Continue PPI  Hemoglobin downtrending likely dilutional.  No active bleeding. Repeat CBC with PCP next week. Recent Labs    10/20/22 1525 02/21/23 1420 06/24/23 1557 08/15/23 1349 08/15/23 1538 08/16/23 0840 08/17/23 0301  HGB 10.8* 10.6* 11.5* 11.0* 10.8* 9.7* 8.7*  MCV 85.9  --  88.9 83.2 79.8* 80.8 83.2  FERRITIN 31 15.5  --   --   --   --   --   TIBC 332  --   --   --   --   --   --   IRON 57 48  --   --   --   --   --     Mobility: Encourage ambulation.  Walked on the hallway yesterday with PT  Goals of care   Code Status: Full Code   Consultants: Nephrology, cardiology Family Communication: Family at bedside  Diet:  Diet Order             Diet general           Diet regular Room service appropriate? Yes; Fluid consistency: Thin; Fluid restriction: 1200 mL Fluid  Diet effective now                   Nutritional status:  Body mass index is 28.8 kg/m.       Wounds:  -    Discharge Exam:   Vitals:   08/17/23 1949 08/18/23 0538 08/18/23 1057 08/18/23 1204  BP: (!) 150/70 (!) 139/58 (!) 133/55 (!) 123/58  Pulse: 84 71 74 63  Resp: 20 20 20    Temp: 98.4 F (36.9 C) 98.2 F (36.8 C) (!) 97.5 F (36.4 C) 98.3 F (36.8 C)  TempSrc: Oral Oral  Oral  SpO2: 98% 95% 97% 96%  Weight:      Height:        Body mass index is 28.8 kg/m.  General exam: Pleasant, elderly female.  Not in distress Skin: No rashes, lesions or ulcers. HEENT: Atraumatic, normocephalic, no obvious bleeding Lungs: Clear to bilaterally.  Improving cough. CVS: S1, S2, no murmur,   GI/Abd: Soft, nontender, nondistended, bowel sound present,   CNS: Alert, awake, oriented x 3 Psychiatry: Mood  appropriate,  Extremities: Improving bilateral nonpitting pedal edema, no calf tenderness.  Follow ups:    Follow-up Information     Ezell Hollow, MD Follow up.   Specialty: Internal Medicine Contact information: [address] [place] [address] [phone]                 Discharge Instructions:   Discharge Instructions     Call MD for:  difficulty breathing, headache or visual disturbances   Complete by: As directed    Call MD for:  extreme fatigue   Complete by: As  directed    Call MD for:  hives   Complete by: As directed    Call MD for:  persistant dizziness or light-headedness   Complete by: As directed    Call MD for:  persistant nausea and vomiting   Complete by: As directed    Call MD for:  severe uncontrolled pain   Complete by: As directed    Call MD for:  temperature >100.4   Complete by: As directed    Diet general   Complete by: As directed    Discharge instructions   Complete by: As directed    Recommendations at discharge:   Stop chlorthalidone .  Hold atorvastatin  and Avapro  till f/u with PCP  Advise follow up with PCP next week.  Repeat CBC, CMP with PCP next week.  General discharge instructions: Follow with Primary MD Ezell Hollow, MD in 7 days  Please request your PCP  to go over your hospital tests, procedures, radiology results at the follow up. Please get your medicines reviewed and adjusted.  Your PCP may decide to repeat certain labs or tests as needed. Do not drive, operate heavy machinery, perform activities at heights, swimming or participation in water activities or provide baby sitting services if your were admitted for syncope or siezures until you have seen by Primary MD or a Neurologist and advised to do so again. [place]  Controlled Substance Reporting System database was reviewed. Do not drive, operate heavy machinery, perform activities at heights, swim, participate in water activities or provide  baby-sitting services while on medications for pain, sleep and mood until your outpatient physician has reevaluated you and advised to do so again.  You are strongly recommended to comply with the dose, frequency and duration of prescribed medications. Activity: As tolerated with Full fall precautions use walker/cane & assistance as needed Avoid using any recreational substances like cigarette, tobacco, alcohol, or non-prescribed drug. If you experience worsening of your admission symptoms, develop shortness of breath, life threatening emergency, suicidal or homicidal thoughts you must seek medical attention immediately by calling 911 or calling your MD immediately  if symptoms less severe. You must read complete instructions/literature along with all the possible adverse reactions/side effects for all the medicines you take and that have been prescribed to you. Take any new medicine only after you have completely understood and accepted all the possible adverse reactions/side effects.  Wear Seat belts while driving. You were cared for by a hospitalist during your hospital stay. If you have any questions about your discharge medications or the care you received while you were in the hospital after you are discharged, you can call the unit and ask to speak with the hospitalist or the covering physician. Once you are discharged, your primary care physician will handle any further medical issues. Please note that NO REFILLS for any discharge medications will be authorized once you are discharged, as it is imperative that you return to your primary care physician (or establish a relationship with a primary care physician if you do not have one).   Increase activity slowly   Complete by: As directed        Discharge Medications:   Allergies as of 08/18/2023       Reactions   Prevnar [pneumococcal 13-val Conj Vacc] Nausea And Vomiting, Other (See Comments)   Fever of 102.    Clarithromycin   [clarithromycin ] Other (See Comments)   Contradictions w/Vytorin    Tetanus-diphtheria Toxoids Td Nausea And Vomiting   REACTION: see OV  note  02-11-10        Medication List     PAUSE taking these medications    atorvastatin  80 MG tablet Wait to take this until your doctor or other care provider tells you to start again. Commonly known as: LIPITOR Take 1 tablet (80 mg total) by mouth daily.   irbesartan  300 MG tablet Wait to take this until your doctor or other care provider tells you to start again. Commonly known as: AVAPRO  Take 1 tablet (300 mg total) by mouth daily.       STOP taking these medications    cephALEXin 500 MG capsule Commonly known as: KEFLEX   chlorthalidone  25 MG tablet Commonly known as: HYGROTON    nitrofurantoin  (macrocrystal-monohydrate) 100 MG capsule Commonly known as: Macrobid        TAKE these medications    aspirin  EC 81 MG tablet Take 81 mg by mouth daily.   CALCIUM  1200 PO Take 1 capsule by mouth daily at 12 noon.   esomeprazole  40 MG capsule Commonly known as: NEXIUM  Take 1 capsule (40 mg total) by mouth 2 (two) times daily before a meal.   ICAPS AREDS 2 PO Take 1 capsule by mouth in the morning and at bedtime.   metFORMIN  1000 MG tablet Commonly known as: GLUCOPHAGE  Take 1 tablet (1,000 mg total) by mouth 2 (two) times daily with a meal.   metoprolol  tartrate 50 MG tablet Commonly known as: LOPRESSOR  Take 2 tablets (100 mg total) by mouth 2 (two) times daily.   multivitamin tablet Take 1 tablet by mouth daily.   ondansetron  4 MG tablet Commonly known as: ZOFRAN  Take 1 tablet (4 mg total) by mouth every 8 (eight) hours as needed for nausea or vomiting.         The results of significant diagnostics from this hospitalization (including imaging, microbiology, ancillary and laboratory) are listed below for reference.    Procedures and Diagnostic Studies:   ECHOCARDIOGRAM COMPLETE Result Date: 08/16/2023     ECHOCARDIOGRAM REPORT   Patient Name:   ALEYZA SCHRAGER Date of Exam: 08/16/2023 Medical Rec #:  [id number]     Height:       64.0 in Accession #:    [id number]    Weight:       167.8 lb Date of Birth:  1945-11-15      BSA:          1.816 m Patient Age:    77 years      BP:           174/51 mmHg Patient Gender: F             HR:           85 bpm. Exam Location:  Inpatient Procedure: 2D Echo, Cardiac Doppler and Color Doppler (Both Spectral and Color            Flow Doppler were utilized during procedure). Indications:    I48.91* Unspeicified atrial fibrillation  History:        Patient has prior history of Echocardiogram examinations, most                 recent 04/09/2015. Arrythmias:Atrial Fibrillation; Risk                 Factors:Hypertension and Diabetes.  Sonographer:    Andrena Bang Referring Phys: [id number] [facility] GOEL IMPRESSIONS  1. Left ventricular ejection fraction, by estimation, is 55 to 60%. The left ventricle has normal function. The left  ventricle has no regional wall motion abnormalities. Left ventricular diastolic parameters were normal.  2. Right ventricular systolic function is normal. The right ventricular size is normal. There is normal pulmonary artery systolic pressure. The estimated right ventricular systolic pressure is 31.7 mmHg.  3. Left atrial size was moderately dilated.  4. The mitral valve is normal in structure. Mild mitral valve regurgitation.  5. The aortic valve is tricuspid. There is mild calcification of the aortic valve. Aortic valve regurgitation is not visualized. Aortic valve sclerosis/calcification is present, without any evidence of aortic stenosis.  6. The inferior vena cava is normal in size with greater than 50% respiratory variability, suggesting right atrial pressure of 3 mmHg. Comparison(s): Prior images unable to be directly viewed, comparison made by report only. FINDINGS  Left Ventricle: Left ventricular ejection fraction, by estimation, is 55 to 60%. The left ventricle  has normal function. The left ventricle has no regional wall motion abnormalities. The left ventricular internal cavity size was normal in size. There is  no left ventricular hypertrophy. Left ventricular diastolic parameters were normal. Right Ventricle: The right ventricular size is normal. No increase in right ventricular wall thickness. Right ventricular systolic function is normal. There is normal pulmonary artery systolic pressure. The tricuspid regurgitant velocity is 2.68 m/s, and  with an assumed right atrial pressure of 3 mmHg, the estimated right ventricular systolic pressure is 31.7 mmHg. Left Atrium: Left atrial size was moderately dilated. Right Atrium: Right atrial size was normal in size. Pericardium: There is no evidence of pericardial effusion. Mitral Valve: The mitral valve is normal in structure. Mild mitral valve regurgitation, with centrally-directed jet. Tricuspid Valve: The tricuspid valve is normal in structure. Tricuspid valve regurgitation is trivial. Aortic Valve: The aortic valve is tricuspid. There is mild calcification of the aortic valve. Aortic valve regurgitation is not visualized. Aortic valve sclerosis/calcification is present, without any evidence of aortic stenosis. Aortic valve mean gradient measures 11.0 mmHg. Aortic valve peak gradient measures 19.2 mmHg. Aortic valve area, by VTI measures 1.55 cm. Pulmonic Valve: The pulmonic valve was grossly normal. Pulmonic valve regurgitation is not visualized. No evidence of pulmonic stenosis. Aorta: The aortic root is normal in size and structure. Venous: The inferior vena cava is normal in size with greater than 50% respiratory variability, suggesting right atrial pressure of 3 mmHg. IAS/Shunts: No atrial level shunt detected by color flow Doppler.  LEFT VENTRICLE PLAX 2D LVIDd:         4.60 cm      Diastology LVIDs:         3.50 cm      LV e' medial:    10.70 cm/s LV PW:         1.10 cm      LV E/e' medial:  10.9 LV IVS:        1.10  cm      LV e' lateral:   10.30 cm/s LVOT diam:     1.90 cm      LV E/e' lateral: 11.3 LV SV:         58 LV SV Index:   32 LVOT Area:     2.84 cm  LV Volumes (MOD) LV vol d, MOD A2C: 100.0 ml LV vol d, MOD A4C: 110.0 ml LV vol s, MOD A2C: 39.1 ml LV vol s, MOD A4C: 37.7 ml LV SV MOD A2C:     60.9 ml LV SV MOD A4C:     110.0 ml LV SV MOD BP:  66.7 ml RIGHT VENTRICLE RV S prime:     15.50 cm/s TAPSE (M-mode): 2.3 cm LEFT ATRIUM             Index LA diam:        3.90 cm 2.15 cm/m LA Vol (A2C):   85.7 ml 47.20 ml/m LA Vol (A4C):   68.2 ml 37.56 ml/m LA Biplane Vol: 79.8 ml 43.95 ml/m  AORTIC VALVE AV Area (Vmax):    1.49 cm AV Area (Vmean):   1.37 cm AV Area (VTI):     1.55 cm AV Vmax:           219.00 cm/s AV Vmean:          152.000 cm/s AV VTI:            0.376 m AV Peak Grad:      19.2 mmHg AV Mean Grad:      11.0 mmHg LVOT Vmax:         115.00 cm/s LVOT Vmean:        73.200 cm/s LVOT VTI:          0.205 m LVOT/AV VTI ratio: 0.55  AORTA Ao Asc diam: 3.30 cm MITRAL VALVE                TRICUSPID VALVE MV Area (PHT): 5.36 cm     TR Peak grad:   28.7 mmHg MV Decel Time: 142 msec     TR Vmax:        268.00 cm/s MV E velocity: 116.50 cm/s MV A velocity: 72.20 cm/s   SHUNTS MV E/A ratio:  1.61         Systemic VTI:  0.20 m                             Systemic Diam: 1.90 cm Karyl Paget Croitoru MD Electronically signed by Luana Rumple MD Signature Date/Time: 08/16/2023/4:41:50 PM    Final    CT ABDOMEN PELVIS W CONTRAST Result Date: 08/15/2023 CLINICAL DATA:  Abdominal pain. EXAM: CT ABDOMEN AND PELVIS WITH CONTRAST TECHNIQUE: Multidetector CT imaging of the abdomen and pelvis was performed using the standard protocol following bolus administration of intravenous contrast. RADIATION DOSE REDUCTION: This exam was performed according to the departmental dose-optimization program which includes automated exposure control, adjustment of the mA and/or kV according to patient size and/or use of iterative reconstruction  technique. CONTRAST:  OMNIPAQUE  IOHEXOL  300 MG/ML  SOLN COMPARISON:  CT abdomen pelvis dated 10/27/2022. FINDINGS: Lower chest: Patchy area of airspace opacity in the visualized left lower lobe concerning for pneumonia or aspiration. Clinical correlation and follow-up to resolution recommended. Trace pericardial effusion. No intra-abdominal free air or free fluid. Hepatobiliary: The liver is unremarkable. No biliary dilatation. The gallbladder is unremarkable. Pancreas: Unremarkable. No pancreatic ductal dilatation or surrounding inflammatory changes. Spleen: Normal in size without focal abnormality. Adrenals/Urinary Tract: The adrenal glands are unremarkable. Right renal parapelvic cyst. There is no hydronephrosis on either side. There is symmetric enhancement and excretion of contrast by both kidneys. The visualized ureters and urinary bladder appear unremarkable. Stomach/Bowel: There is no bowel obstruction or active inflammation. The appendix is normal. Vascular/Lymphatic: Advanced aortoiliac atherosclerotic disease. The IVC is unremarkable. No portal venous gas. There is no adenopathy. Reproductive: The uterus is grossly unremarkable. No suspicious adnexal masses. Other: None Musculoskeletal: Osteopenia with degenerative changes of the spine. No acute osseous pathology. IMPRESSION: 1. No acute intra-abdominal or pelvic pathology.  2. Left lower lobe pneumonia or aspiration. 3.  Aortic Atherosclerosis (ICD10-I70.0). Electronically Signed   By: Angus Bark M.D.   On: 08/15/2023 17:32   DG Chest Portable 1 View Result Date: 08/15/2023 CLINICAL DATA:  Short of breath EXAM: PORTABLE CHEST 1 VIEW COMPARISON:  09/10/2003 FINDINGS: Single frontal view of the chest demonstrates mild enlargement the cardiac silhouette. There is increased pulmonary vascular congestion and interstitial prominence, compatible with fluid overload and interstitial edema. No acute airspace disease, effusion, or pneumothorax. No  acute bony abnormalities. IMPRESSION: 1. Constellation of findings most consistent with mild congestive heart failure and developing interstitial edema. Electronically Signed   By: Bobbye Burrow M.D.   On: 08/15/2023 16:50     Labs:   Basic Metabolic Panel: Recent Labs  Lab 08/15/23 1538 08/15/23 2214 08/16/23 0840 08/16/23 1303 08/17/23 0301 08/17/23 1300 08/17/23 1917 08/18/23 0541 08/18/23 1248  NA 115* 120* 120*   < > 122* 125* 125* 127* 129*  K 2.9* 2.8* 3.4*  --  4.0  --   --  3.5  --   CL 76* 83* 87*  --  92*  --   --  94*  --   CO2 25 24 21*  --  23  --   --  24  --   GLUCOSE 156* 144* 159*  --  155*  --   --  133*  --   BUN 16 15 14   --  16  --   --  14  --   CREATININE 1.24* 1.06* 0.85  --  1.18*  --   --  1.04*  --   CALCIUM  9.0 8.2* 8.1*  --  8.0*  --   --  8.7*  --   MG  --  0.9* 2.1  --   --   --   --   --   --    < > = values in this interval not displayed.   GFR Estimated Creatinine Clearance: 46 mL/min (A) (by C-G formula based on SCr of 1.04 mg/dL (H)). Liver Function Tests: Recent Labs  Lab 08/15/23 1349 08/15/23 1538 08/17/23 2100  AST 35 51* 98*  ALT 19 28 66*  ALKPHOS 44 61 44  BILITOT 0.9 0.8 0.5  PROT 6.3 7.0 5.6*  ALBUMIN 3.5 4.0 2.3*   No results for input(s): "LIPASE", "AMYLASE" in the last 168 hours. No results for input(s): "AMMONIA" in the last 168 hours. Coagulation profile Recent Labs  Lab 08/16/23 0840  INR 1.2    CBC: Recent Labs  Lab 08/15/23 1349 08/15/23 1538 08/16/23 0840 08/17/23 0301  WBC 9.7 9.3 8.5 6.3  NEUTROABS 8.7* 8.3*  --   --   HGB 11.0* 10.8* 9.7* 8.7*  HCT 31.3* 30.0* 27.3* 25.2*  MCV 83.2 79.8* 80.8 83.2  PLT 203.0 192 187 195   Cardiac Enzymes: Recent Labs  Lab 08/15/23 2214  CKTOTAL 586*   BNP: Invalid input(s): "POCBNP" CBG: Recent Labs  Lab 08/17/23 1751 08/17/23 2011 08/18/23 0726 08/18/23 1309 08/18/23 1622  GLUCAP 126* 192* 131* 201* 121*   D-Dimer No results for input(s):  "DDIMER" in the last 72 hours. Hgb A1c No results for input(s): "HGBA1C" in the last 72 hours. Lipid Profile No results for input(s): "CHOL", "HDL", "LDLCALC", "TRIG", "CHOLHDL", "LDLDIRECT" in the last 72 hours. Thyroid  function studies Recent Labs    08/16/23 0840  TSH 0.794   Anemia work up No results for input(s): "VITAMINB12", "FOLATE", "FERRITIN", "TIBC", "IRON", "RETICCTPCT"  in the last 72 hours. Microbiology Recent Results (from the past 240 hours)  Urine Culture     Status: None   Collection Time: 08/15/23  1:49 PM   Specimen: Urine  Result Value Ref Range Status   MICRO NUMBER: [id number]  Final   SPECIMEN QUALITY: Adequate  Final   Sample Source URINE  Final   STATUS: FINAL  Final   Result: No Growth  Final  Resp panel by RT-PCR (RSV, Flu A&B, Covid) Anterior Nasal Swab     Status: None   Collection Time: 08/15/23  4:03 PM   Specimen: Anterior Nasal Swab  Result Value Ref Range Status   SARS Coronavirus 2 by RT PCR NEGATIVE NEGATIVE Final    Comment: (NOTE) SARS-CoV-2 target nucleic acids are NOT DETECTED.  The SARS-CoV-2 RNA is generally detectable in upper respiratory specimens during the acute phase of infection. The lowest concentration of SARS-CoV-2 viral copies this assay can detect is 138 copies/mL. A negative result does not preclude SARS-Cov-2 infection and should not be used as the sole basis for treatment or other patient management decisions. A negative result may occur with  improper specimen collection/handling, submission of specimen other than nasopharyngeal swab, presence of viral mutation(s) within the areas targeted by this assay, and inadequate number of viral copies(<138 copies/mL). A negative result must be combined with clinical observations, patient history, and epidemiological information. The expected result is Negative.  Fact Sheet for Patients:  BloggerCourse.com  Fact Sheet for Healthcare Providers:   SeriousBroker.it  This test is no t yet approved or cleared by the [place]  FDA and  has been authorized for detection and/or diagnosis of SARS-CoV-2 by FDA under an Emergency Use Authorization (EUA). This EUA will remain  in effect (meaning this test can be used) for the duration of the COVID-19 declaration under Section 564(b)(1) of the Act, 21 U.S.C.section 360bbb-3(b)(1), unless the authorization is terminated  or revoked sooner.       Influenza A by PCR NEGATIVE NEGATIVE Final   Influenza B by PCR NEGATIVE NEGATIVE Final    Comment: (NOTE) The Xpert Xpress SARS-CoV-2/FLU/RSV plus assay is intended as an aid in the diagnosis of influenza from Nasopharyngeal swab specimens and should not be used as a sole basis for treatment. Nasal washings and aspirates are unacceptable for Xpert Xpress SARS-CoV-2/FLU/RSV testing.  Fact Sheet for Patients: BloggerCourse.com  Fact Sheet for Healthcare Providers: SeriousBroker.it  This test is not yet approved or cleared by the [place]  FDA and has been authorized for detection and/or diagnosis of SARS-CoV-2 by FDA under an Emergency Use Authorization (EUA). This EUA will remain in effect (meaning this test can be used) for the duration of the COVID-19 declaration under Section 564(b)(1) of the Act, 21 U.S.C. section 360bbb-3(b)(1), unless the authorization is terminated or revoked.     Resp Syncytial Virus by PCR NEGATIVE NEGATIVE Final    Comment: (NOTE) Fact Sheet for Patients: BloggerCourse.com  Fact Sheet for Healthcare Providers: SeriousBroker.it  This test is not yet approved or cleared by the [place]  FDA and has been authorized for detection and/or diagnosis of SARS-CoV-2 by FDA under an Emergency Use Authorization (EUA). This EUA will remain in effect (meaning this test can be used) for  the duration of the COVID-19 declaration under Section 564(b)(1) of the Act, 21 U.S.C. section 360bbb-3(b)(1), unless the authorization is terminated or revoked.  Performed at [facility], [address] [place], [place] [address]   Blood culture (routine x 2)  Status: None (Preliminary result)   Collection Time: 08/15/23  6:14 PM   Specimen: BLOOD  Result Value Ref Range Status   Specimen Description   Final    BLOOD RIGHT ANTECUBITAL Performed at [facility], [address], [place], [place] [address]    Special Requests   Final    BOTTLES DRAWN AEROBIC AND ANAEROBIC Blood Culture adequate volume Performed at [facility], [address] [place], [place] [address]    Culture   Final    NO GROWTH 3 DAYS Performed at [facility], [address], [place], [place] [address]    Report Status PENDING  Incomplete  Blood culture (routine x 2)     Status: None (Preliminary result)   Collection Time: 08/15/23 10:14 PM   Specimen: BLOOD  Result Value Ref Range Status   Specimen Description   Final    BLOOD BLOOD LEFT ARM Performed at [facility], [address] [place], [place] [address]    Special Requests   Final    BOTTLES DRAWN AEROBIC AND ANAEROBIC Blood Culture results may not be optimal due to an inadequate volume of blood received in culture bottles Performed at [facility], [address] [place], [place] [address]    Culture   Final    NO GROWTH 2 DAYS Performed at [facility], [address], [place], [place] [address]    Report Status PENDING  Incomplete    Time coordinating discharge: 45 minutes  Signed: Jiovany Scheffel  [facility] 08/18/2023, 4:46 PM

## 2023-08-18 NOTE — Progress Notes (Signed)
 PROGRESS NOTE  Casey Bryan  DOB: 1946/01/18  PCP: Ezell Hollow, MD ZOX:[id number]  DOA: 08/15/2023  LOS: 3 days  Hospital Day: 4  Brief narrative: Casey Bryan is a 78 y.o. female with PMH significant for DM2, HTN, HLD, TIA (amaurosis fugax), glaucoma, GERD 4/28, patient was sent to the ED by PCP for significant low sodium level of 115.  Patient reports fatigue, urinary symptoms, low-grade fever, headache for few days followed by vomiting and diarrhea and hence went to see her PCP.   Labs in the ED confirmed low sodium level Admitted to [facility]  Subjective: Patient was seen and examined this morning.  Lying on bed.  Not in distress.  Family at bedside and on the phone.  Cough improving. Sodium level improving.  Received 1 dose of tolvaptan  yesterday. Family concerned about rising LFTs.  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 Cortisol level normal Urine osmolality more than 100 in the setting of low serum osmolality suggestive of SIADH Sodium level improved initially with IV hydration and plateaued at 122 4/30, nephrology was consulted.  1 dose of tolvaptan  was given.  Improvement in urine trend as below, 127 today. Able to tolerate oral diet Recent Labs  Lab 08/15/23 1538 08/15/23 2214 08/16/23 0840 08/16/23 1303 08/16/23 1654 08/16/23 2117 08/17/23 0301 08/17/23 1300 08/17/23 1917 08/18/23 0541  NA 115* 120* 120* 118* 120* 122* 122* 125* 125* 127*   Left lower lobe pneumonia Seen in CT scan from 4/28 No respiratory issues WBC normal, procalcitonin slightly elevated Blood culture has not shown any growth so far Cough improving. Continue Augmentin  to complete 5-day course Recent Labs  Lab 08/15/23 1349  08/15/23 1538 08/16/23 0840 08/17/23 0301  WBC 9.7 9.3 8.5 6.3  PROCALCITON  --   --  0.32  --     New onset A-fib with RVR A-fib occurred in the setting of profound electrolyte abnormalities  PTA meds- metoprolol  tartrate 100 mg twice daily which was held on admission.  A-fib improved after metoprolol  was resumed.   Currently normal sinus rhythm  Not on anticoagulation  Cardiology consult appreciated  Hypertension Chronic bilateral pedal edema PTA meds- metoprolol  tartrate 100 mg twice daily, chlorthalidone  25 mg daily, Avapro  300 mg daily Currently resumed on metoprolol . Has chronic bilateral pedal edema.  Chlorthalidone  on hold permanently.  If required, may need to consider loop diuretic per nephrology Echocardiogram 4/29 showed EF of 55 to 60%  AKI Baseline creatinine of 0.9-1. Mild AKI on admission with creatinine up to 1.24 Improving Recent Labs    02/21/23 1420 06/24/23 1557 08/15/23 1349 08/15/23 1538 08/15/23 2214 08/16/23 0840 08/17/23 0301 08/18/23 0541  BUN 19 15 16 16 15 14 16 14   CREATININE 1.05 0.93 1.00 1.24* 1.06* 0.85 1.18* 1.04*   Elevated LFTs AST and ALT up up in last 2 days.  Normal alk phos and bilirubin.  ??  Unclear etiology. Chronically on statin.  Hold statin today.  Repeat LFTs tomorrow. CT abdomen from 4/28 did not show any abnormality liver parenchyma Recent Labs  Lab 08/15/23 1349 08/15/23 1538 08/17/23 2100  AST 35 51* 98*  ALT 19 28 66*  ALKPHOS 44 61 44  BILITOT 0.9 0.8 0.5  PROT 6.3 7.0 5.6*  ALBUMIN 3.5  4.0 2.3*    Type 2 diabetes mellitus A1c controlled at 6 on 06/24/2023 PTA meds- metformin  1000 mg twice daily, currently on hold Currently on SSI/Accu-Cheks Recent Labs  Lab 08/17/23 0820 08/17/23 1232 08/17/23 1751 08/17/23 2011 08/18/23 0726  GLUCAP 127* 138* 126* 192* 131*   H/o TIA HLD Continue aspirin  statin on hold due to elevated LFTs  Mild chronic anemia GERD Continue PPI  Hemoglobin downtrending  likely dilutional.  No active bleeding. CBC not done today.  Repeat tomorrow. Recent Labs    10/20/22 1525 02/21/23 1420 06/24/23 1557 08/15/23 1349 08/15/23 1538 08/16/23 0840 08/17/23 0301  HGB 10.8* 10.6* 11.5* 11.0* 10.8* 9.7* 8.7*  MCV 85.9  --  88.9 83.2 79.8* 80.8 83.2  FERRITIN 31 15.5  --   --   --   --   --   TIBC 332  --   --   --   --   --   --   IRON 57 48  --   --   --   --   --     Mobility: Encourage ambulation.  Walked on the hallway yesterday with PT  Goals of care   Code Status: Full Code  Pending PT eval   DVT prophylaxis:  SCDs Start: 08/15/23 2139   Antimicrobials: Oral Augmentin  Fluid: Fluid restriction Consultants: Nephrology, cardiology Family Communication: family at bedside  Status: Inpatient Level of care:  Telemetry.   Patient is from: Home Needs to continue in-hospital care: Needs to monitor sodium level Anticipated d/c to: Hopefully home in 1 to 2 days    Diet:  Diet Order             Diet regular Room service appropriate? Yes; Fluid consistency: Thin; Fluid restriction: 1200 mL Fluid  Diet effective now                   Scheduled Meds:  amoxicillin -clavulanate  1 tablet Oral Q12H   aspirin  EC  81 mg Oral Daily   Chlorhexidine  Gluconate Cloth  6 each Topical Daily   insulin  aspart  0-15 Units Subcutaneous TID WC   insulin  aspart  0-5 Units Subcutaneous QHS   metoprolol  tartrate  100 mg Oral BID   pantoprazole   40 mg Oral Daily   sodium chloride  flush  3 mL Intravenous Q12H    PRN meds: acetaminophen  **OR** acetaminophen , diphenhydrAMINE , EPINEPHrine , guaiFENesin , hydrALAZINE , ipratropium-albuterol , labetalol , mouth rinse, polyethylene glycol, sodium chloride    Infusions:     Antimicrobials: Anti-infectives (From admission, onward)    Start     Dose/Rate Route Frequency Ordered Stop   08/18/23 1000  amoxicillin -clavulanate (AUGMENTIN ) 875-125 MG per tablet 1 tablet        1 tablet Oral Every 12 hours 08/17/23  1450 08/20/23 0959   08/17/23 1700  azithromycin  (ZITHROMAX ) tablet 600 mg  Status:  Discontinued        600 mg Oral Daily 08/17/23 1450 08/17/23 1450   08/17/23 1700  azithromycin  (ZITHROMAX ) tablet 500 mg        500 mg Oral Daily 08/17/23 1450 08/17/23 1853   08/17/23 1300  amoxicillin  (AMOXIL ) capsule 500 mg        500 mg Oral  Once 08/17/23 1213 08/17/23 1223   08/16/23 1000  cefTRIAXone  (ROCEPHIN ) 1 g in sodium chloride  0.9 % 100 mL IVPB  Status:  Discontinued        1 g 200 mL/hr over 30 Minutes Intravenous Every 24 hours 08/15/23 2115 08/17/23 1450  08/15/23 2200  azithromycin  (ZITHROMAX ) 500 mg in sodium chloride  0.9 % 250 mL IVPB  Status:  Discontinued       Note to Pharmacy: She does not have a true allergy to clarithromycin . It would interac with a previous medication (vytorin ) that she was on.   500 mg 250 mL/hr over 60 Minutes Intravenous Every 24 hours 08/15/23 2115 08/17/23 1450   08/15/23 2200  metroNIDAZOLE  (FLAGYL ) IVPB 500 mg  Status:  Discontinued        500 mg 100 mL/hr over 60 Minutes Intravenous Every 12 hours 08/15/23 2123 08/16/23 1054   08/15/23 1815  cefTRIAXone  (ROCEPHIN ) 1 g in sodium chloride  0.9 % 100 mL IVPB        1 g 200 mL/hr over 30 Minutes Intravenous  Once 08/15/23 1800 08/15/23 1835       Objective: Vitals:   08/17/23 1949 08/18/23 0538  BP: (!) 150/70 (!) 139/58  Pulse: 84 71  Resp: 20 20  Temp: 98.4 F (36.9 C) 98.2 F (36.8 C)  SpO2: 98% 95%    Intake/Output Summary (Last 24 hours) at 08/18/2023 1004 Last data filed at 08/18/2023 0830 Gross per 24 hour  Intake 687.56 ml  Output --  Net 687.56 ml   Filed Weights   08/15/23 2014  Weight: 76.1 kg   Weight change:  Body mass index is 28.8 kg/m.   Physical Exam: General exam: Pleasant, elderly female.  Not in distress Skin: No rashes, lesions or ulcers. HEENT: Atraumatic, normocephalic, no obvious bleeding Lungs: Clear to bilaterally.  Improving cough. CVS: S1, S2, no murmur,    GI/Abd: Soft, nontender, nondistended, bowel sound present,   CNS: Alert, awake, oriented x 3 Psychiatry: Mood appropriate,  Extremities: Improving bilateral nonpitting pedal edema, no calf tenderness,   Data Review: I have personally reviewed the laboratory data and studies available.  F/u labs ordered Unresulted Labs (From admission, onward)     Start     Ordered   08/19/23 0500  Hepatic function panel  Tomorrow morning,   R       Question:  Specimen collection method  Answer:  Lab=Lab collect   08/18/23 1000   08/19/23 0500  CBC with Differential/Platelet  Tomorrow morning,   R       Question:  Specimen collection method  Answer:  Lab=Lab collect   08/18/23 1004   08/18/23 0432  Sodium  Now then every 8 hours,   R     Question:  Specimen collection method  Answer:  Lab=Lab collect   08/17/23 2034   08/17/23 0500  Basic metabolic panel with GFR  Daily,   R     Question:  Specimen collection method  Answer:  Lab=Lab collect   08/16/23 1239   08/16/23 0012  MRSA Next Gen by PCR, Nasal  Once,   R        08/16/23 0011            Signed, Hoyt Macleod, MD [facility] 08/18/2023

## 2023-08-18 NOTE — Plan of Care (Signed)
  Problem: Education: Goal: Knowledge of General Education information will improve Description: Including pain rating scale, medication(s)/side effects and non-pharmacologic comfort measures Outcome: Progressing   Problem: Health Behavior/Discharge Planning: Goal: Ability to manage health-related needs will improve Outcome: Progressing   Problem: Clinical Measurements: Goal: Ability to maintain clinical measurements within normal limits will improve Outcome: Progressing Goal: Will remain free from infection Outcome: Progressing Goal: Respiratory complications will improve Outcome: Progressing Goal: Cardiovascular complication will be avoided Outcome: Progressing   Problem: Activity: Goal: Risk for activity intolerance will decrease Outcome: Progressing   Problem: Nutrition: Goal: Adequate nutrition will be maintained Outcome: Progressing   Problem: Coping: Goal: Level of anxiety will decrease Outcome: Progressing   Problem: Elimination: Goal: Will not experience complications related to bowel motility Outcome: Progressing Goal: Will not experience complications related to urinary retention Outcome: Progressing   Problem: Pain Managment: Goal: General experience of comfort will improve and/or be controlled Outcome: Progressing   Problem: Safety: Goal: Ability to remain free from injury will improve Outcome: Progressing   Problem: Skin Integrity: Goal: Risk for impaired skin integrity will decrease Outcome: Progressing   Problem: Education: Goal: Ability to describe self-care measures that may prevent or decrease complications (Diabetes Survival Skills Education) will improve Outcome: Progressing Goal: Individualized Educational Video(s) Outcome: Progressing   Problem: Coping: Goal: Ability to adjust to condition or change in health will improve Outcome: Progressing   Problem: Fluid Volume: Goal: Ability to maintain a balanced intake and output will  improve Outcome: Progressing   Problem: Health Behavior/Discharge Planning: Goal: Ability to identify and utilize available resources and services will improve Outcome: Progressing Goal: Ability to manage health-related needs will improve Outcome: Progressing   Problem: Metabolic: Goal: Ability to maintain appropriate glucose levels will improve Outcome: Progressing   Problem: Nutritional: Goal: Maintenance of adequate nutrition will improve Outcome: Progressing Goal: Progress toward achieving an optimal weight will improve Outcome: Progressing   Problem: Skin Integrity: Goal: Risk for impaired skin integrity will decrease Outcome: Progressing   Problem: Tissue Perfusion: Goal: Adequacy of tissue perfusion will improve Outcome: Progressing   Problem: Clinical Measurements: Goal: Diagnostic test results will improve Outcome: Not Progressing

## 2023-08-19 ENCOUNTER — Telehealth: Payer: Self-pay

## 2023-08-19 NOTE — Transitions of Care (Post Inpatient/ED Visit) (Signed)
 08/19/2023  Name: Casey Bryan MRN: [id number] DOB: 15-Aug-1945  Today's TOC FU Call Status: Today's TOC FU Call Status:: Successful TOC FU Call Completed TOC FU Call Complete Date: 08/19/23 Patient's Name and Date of Birth confirmed.  Transition Care Management Follow-up Telephone Call Date of Discharge: 08/18/23 Discharge Facility: [facility]) Type of Discharge: Inpatient Admission Primary Inpatient Discharge Diagnosis:: hypo osmolality How have you been since you were released from the hospital?: Better Any questions or concerns?: No  Items Reviewed: Did you receive and understand the discharge instructions provided?: Yes Medications obtained,verified, and reconciled?: Yes (Medications Reviewed) Any new allergies since your discharge?: No Dietary orders reviewed?: Yes Do you have support at home?: Yes People in Home [RPT]: child(ren), adult  Medications Reviewed Today: Medications Reviewed Today     Reviewed by Darrall Ellison, LPN (Licensed Practical Nurse) on 08/19/23 at [phone]  Med List Status: <None>   Medication Order Taking? Sig Documenting Provider Last Dose Status Informant  aspirin  EC 81 MG tablet [id number] No Take 81 mg by mouth daily. [provider] 08/15/2023 Active Self, Family Member, Pharmacy Records, Multiple Informants  atorvastatin  (LIPITOR) 80 MG tablet [id number] No Take 1 tablet (80 mg total) by mouth daily. Ezell Hollow, MD 08/15/2023 Active Self, Family Member, Pharmacy Records, Multiple Informants  Calcium  Carbonate-Vit D-Min (CALCIUM  1200 PO) [id number] No Take 1 capsule by mouth daily at 12 noon. [provider] 08/15/2023 Active Self, Family Member, Pharmacy Records, Multiple Informants           Med Note Michae Aden Nov 07, 2014  9:23 AM) .  esomeprazole  (NEXIUM ) 40 MG capsule [id number] No Take 1 capsule (40 mg total) by mouth 2 (two) times daily before a meal. Ezell Hollow, MD 08/15/2023 Active Self, Family Member,  Pharmacy Records, Multiple Informants  irbesartan  (AVAPRO ) 300 MG tablet [id number] No Take 1 tablet (300 mg total) by mouth daily. Ezell Hollow, MD 08/15/2023 Active Self, Family Member, Pharmacy Records, Multiple Informants  metFORMIN  (GLUCOPHAGE ) 1000 MG tablet [id number] No Take 1 tablet (1,000 mg total) by mouth 2 (two) times daily with a meal. Ezell Hollow, MD 08/15/2023 Active Self, Family Member, Pharmacy Records, Multiple Informants  metoprolol  tartrate (LOPRESSOR ) 50 MG tablet [id number] No Take 2 tablets (100 mg total) by mouth 2 (two) times daily. Ezell Hollow, MD 08/15/2023 Active Self, Family Member, Pharmacy Records, Multiple Informants  Multiple Vitamin (MULTIVITAMIN) tablet [id number] No Take 1 tablet by mouth daily. [provider] 08/15/2023 Active Self, Family Member, Pharmacy Records, Multiple Informants  Multiple Vitamins-Minerals (ICAPS AREDS 2 PO) [id number] No Take 1 capsule by mouth in the morning and at bedtime. [provider] 08/15/2023 Active Self, Family Member, Pharmacy Records, Multiple Informants  ondansetron  (ZOFRAN ) 4 MG tablet [id number] No Take 1 tablet (4 mg total) by mouth every 8 (eight) hours as needed for nausea or vomiting. Neda Balk, MD Unknown Active Self, Family Member, Pharmacy Records, Multiple Informants            Home Care and Equipment/Supplies: Were Home Health Services Ordered?: NA Any new equipment or medical supplies ordered?: NA  Functional Questionnaire: Do you need assistance with bathing/showering or dressing?: No Do you need assistance with meal preparation?: No Do you need assistance with eating?: No Do you have difficulty maintaining continence: No Do you need assistance with getting out of bed/getting out of a chair/moving?: No Do you have difficulty managing or taking your medications?: No  Follow up appointments reviewed: PCP Follow-up appointment confirmed?: Yes Date of PCP follow-up appointment?:  08/23/23 Follow-up Provider: [facility] Follow-up appointment confirmed?: Yes Date of Specialist follow-up appointment?: 09/06/23 Follow-Up Specialty Provider:: cardio Do you need transportation to your follow-up appointment?: No Do you understand care options if your condition(s) worsen?: Yes-patient verbalized understanding    SIGNATURE Darrall Ellison, LPN [facility] Nurse Health Advisor Direct Dial [phone]

## 2023-08-20 LAB — CULTURE, BLOOD (ROUTINE X 2)
Culture: NO GROWTH
Special Requests: ADEQUATE

## 2023-08-21 DIAGNOSIS — I4891 Unspecified atrial fibrillation: Secondary | ICD-10-CM | POA: Diagnosis not present

## 2023-08-21 LAB — CULTURE, BLOOD (ROUTINE X 2): Culture: NO GROWTH

## 2023-08-23 ENCOUNTER — Ambulatory Visit (INDEPENDENT_AMBULATORY_CARE_PROVIDER_SITE_OTHER): Admitting: Internal Medicine

## 2023-08-23 ENCOUNTER — Encounter: Payer: Self-pay | Admitting: Internal Medicine

## 2023-08-23 VITALS — BP 138/88 | HR 64 | Ht 64.0 in | Wt 172.0 lb

## 2023-08-23 DIAGNOSIS — E871 Hypo-osmolality and hyponatremia: Secondary | ICD-10-CM | POA: Diagnosis not present

## 2023-08-23 DIAGNOSIS — J189 Pneumonia, unspecified organism: Secondary | ICD-10-CM

## 2023-08-23 DIAGNOSIS — D649 Anemia, unspecified: Secondary | ICD-10-CM

## 2023-08-23 DIAGNOSIS — E119 Type 2 diabetes mellitus without complications: Secondary | ICD-10-CM

## 2023-08-23 DIAGNOSIS — I10 Essential (primary) hypertension: Secondary | ICD-10-CM | POA: Diagnosis not present

## 2023-08-23 DIAGNOSIS — R109 Unspecified abdominal pain: Secondary | ICD-10-CM | POA: Diagnosis not present

## 2023-08-23 NOTE — Progress Notes (Unsigned)
 Subjective:    Patient ID: Casey Bryan, female    DOB: 04/13/1946, 78 y.o.   MRN: [id number]  DOS:  08/23/2023 Type of visit - description: Hospital follow-up, here with her daughter  Started to feel unwell around 08/07/2023, eventually went to urgent care 08/12/2023 with fatigue, headache, sweats, had a low-grade fever x 1 of 99. Apparently they diagnosed her with a UTI and prescribed Keflex.  Seen at this office 08/15/2023 with similar symptoms.  In addition she developed  abdominal pain, vomiting and diarrhea.  Blood work revealed hyponatremia and consequently was admitted to the hospital and treated for the following diagnosis: Hyponatremia, SIADH, pneumonia, A-fib, HTN, increased LFTs.  She is here with her daughter. Overall improved but he still is quite fatigued. No further fever. On further questioning she admits to cough which is dry. Appetite is okay. No nausea vomiting.  No diarrhea.  No abdominal pain. Denies chest pain or difficulty breathing at this point.    Review of Systems See above   Past Medical History:  Diagnosis Date   Amaurosis fugax 12/12/2015   Diabetes mellitus    GERD (gastroesophageal reflux disease)    Glaucoma 04-2012   Hyperlipemia    Hypertension     Past Surgical History:  Procedure Laterality Date   NO PAST SURGERIES      Current Outpatient Medications  Medication Instructions   aspirin  EC 81 mg, Daily   [Paused] atorvastatin  (LIPITOR) 80 mg, Oral, Daily   Calcium  Carbonate-Vit D-Min (CALCIUM  1200 PO) 1 capsule, Daily   esomeprazole  (NEXIUM ) 40 mg, Oral, 2 times daily before meals   [Paused] irbesartan  (AVAPRO ) 300 mg, Oral, Daily   metFORMIN  (GLUCOPHAGE ) 1,000 mg, Oral, 2 times daily with meals   metoprolol  tartrate (LOPRESSOR ) 100 mg, Oral, 2 times daily   Multiple Vitamin (MULTIVITAMIN) tablet 1 tablet, Daily   Multiple Vitamins-Minerals (ICAPS AREDS 2 PO) 1 capsule, 2 times daily   ondansetron  (ZOFRAN ) 4 mg, Oral, Every 8 hours PRN    Probiotic Product (PROBIOTIC ADVANCED PO) 1 tablet, Daily       Objective:   Physical Exam BP (!) 150/72   Pulse 64   Ht 5\' 4"  (1.626 m)   Wt 172 lb (78 kg)   SpO2 94%   BMI 29.52 kg/m  General:   Well developed, looks slightly tired but in no distress.[name]  HEENT:  Normocephalic . Face symmetric, atraumatic Lungs:  Cough noted, few crackles on the leg space. Normal respiratory effort, no intercostal retractions, no accessory muscle use. Heart: RRR,  no murmur.  Abdomen:  Not distended, soft, non-tender. No rebound or rigidity.   Skin: Not pale. Not jaundice Lower extremities: no pretibial edema bilaterally  Neurologic:  alert & oriented X3.  Speech normal, gait appropriate for age and unassisted Psych--  Cognition and judgment appear intact.  Cooperative with normal attention span and concentration.  Behavior appropriate. No anxious or depressed appearing.     Assessment   Assessment: DM HTN (intolerant amlodipine  11-2015 and 2018) Hyperlipidemia Glaucoma dx 2014, cataracts NEURO: L amaurosix fugax 03-2015, CVA -- saw opthalmology, started ASA  (02-2015); echo 04-09-15: nl EF, grade 2 Diast disfx -- saw neuro: Probable cryptogenic  --Headaches: Sees  neurology   CV: + FH CV Dz Carotid artery disease:    US  04-2015: Progression of the right ICA now 40-59%. Stable left ICA 1-39%. US  02/2018, 07-30-2020 : carotids essentially normal GI: -GERD -Anemia: EGD colonoscopy 2017, Dr. Denece Finger,  [place] polyp removed  PLAN: Hospital follow-up, TCM 7 Acute hyponatremia, likely SIADH. Presented to the ER with nausea, vomiting, diarrhea, poor oral intake, on chlorthalidone , sodium level up on admission was 115. Cortisol level normal. Workup suggested SIADH. Saw nephrology, tolvaptan  x 1 provided, sodium trending up, was released home.  Chlorthalidone  stopped. Plan: Check CMP.  Further advised for result. L community-acquired pneumonia:  - Chest x-ray negative but PNM was  seen on CT scan of the chest. - Had mild cough prior to admission and continue with it.  On exam she is still have some crackles at the left base. - S/p Rocephin , Zithromax . - Plan: Chest x-ray in 3 days (when she temporarily remove the heart monitor), consider a second round of antibiotics. New onset A-fib with RVR: In the setting of profound electrolyte abnormalities.  A-fib improved after she was put back on metoprolol , one of her routine meds, they got the opinion from cardiology, no need for anticoagulation.  Has a monitor in place.  No symptoms, seems to be in sinus rhythm today. HTN: Upon admission, chlorthalidone  was stopped permanently, Avapro  is on hold, on metoprolol .  BP this morning 141/84.  BP 138/88 when rechecked today.  Plan: Continue holding Avapro , continue metoprolol .  Monitor BPs at home, restart medications as needed Anemia: Noted during the admission, recheck CBC anemia panel.  Had GI symptoms prior to admission, they resolved, last colonoscopy in 2017.  Further advise w/ results Fatigue  Still feels fatigued, likely multifactorial.   Increased LFTs: Rechecking today High cholesterol: Holding statins. RTC 3 weeks

## 2023-08-23 NOTE — Patient Instructions (Addendum)
 Continue holding atorvastatin   Continue holding irversartan   Check the  blood pressure regularly Blood pressure goal:  between 110/65 and  135/85. If it is consistently higher or lower, let me know     GO TO THE LAB : Get the blood work     Next office visit for a checkup in 3 weeks   please make an appointment before you leave today      STOP BY THE FIRST FLOOR:  get the XR

## 2023-08-24 LAB — CBC WITH DIFFERENTIAL/PLATELET
Basophils Absolute: 0.1 10*3/uL (ref 0.0–0.1)
Basophils Relative: 2.7 % (ref 0.0–3.0)
Eosinophils Absolute: 0.1 10*3/uL (ref 0.0–0.7)
Eosinophils Relative: 2.6 % (ref 0.0–5.0)
HCT: 29.3 % — ABNORMAL LOW (ref 36.0–46.0)
Hemoglobin: 10 g/dL — ABNORMAL LOW (ref 12.0–15.0)
Lymphocytes Relative: 27.6 % (ref 12.0–46.0)
Lymphs Abs: 1.4 10*3/uL (ref 0.7–4.0)
MCHC: 34.1 g/dL (ref 30.0–36.0)
MCV: 86 fl (ref 78.0–100.0)
Monocytes Absolute: 0.3 10*3/uL (ref 0.1–1.0)
Monocytes Relative: 6.3 % (ref 3.0–12.0)
Neutro Abs: 3 10*3/uL (ref 1.4–7.7)
Neutrophils Relative %: 60.8 % (ref 43.0–77.0)
Platelets: 399 10*3/uL (ref 150.0–400.0)
RBC: 3.41 Mil/uL — ABNORMAL LOW (ref 3.87–5.11)
RDW: 15 % (ref 11.5–15.5)
WBC: 5 10*3/uL (ref 4.0–10.5)

## 2023-08-24 LAB — COMPREHENSIVE METABOLIC PANEL WITH GFR
ALT: 23 U/L (ref 0–35)
AST: 16 U/L (ref 0–37)
Albumin: 3.7 g/dL (ref 3.5–5.2)
Alkaline Phosphatase: 48 U/L (ref 39–117)
BUN: 24 mg/dL — ABNORMAL HIGH (ref 6–23)
CO2: 28 meq/L (ref 19–32)
Calcium: 9.3 mg/dL (ref 8.4–10.5)
Chloride: 98 meq/L (ref 96–112)
Creatinine, Ser: 1.02 mg/dL (ref 0.40–1.20)
GFR: 53.28 mL/min — ABNORMAL LOW (ref >60.00)
Glucose, Bld: 94 mg/dL (ref 70–99)
Potassium: 4.8 meq/L (ref 3.5–5.1)
Sodium: 135 meq/L (ref 135–145)
Total Bilirubin: 0.4 mg/dL (ref 0.2–1.2)
Total Protein: 6.4 g/dL (ref 6.0–8.3)

## 2023-08-24 LAB — IBC + FERRITIN
Ferritin: 104.8 ng/mL (ref 10.0–291.0)
Iron: 83 ug/dL (ref 42–145)
Saturation Ratios: 28.4 % (ref 20.0–50.0)
TIBC: 292.6 ug/dL (ref 250.0–450.0)
Transferrin: 209 mg/dL — ABNORMAL LOW (ref 212.0–360.0)

## 2023-08-24 NOTE — Assessment & Plan Note (Signed)
 Hospital follow-up, TCM 7 Acute hyponatremia, likely SIADH. Presented to the ER with nausea, vomiting, diarrhea, poor oral intake, on chlorthalidone , sodium level up on admission was 115. Cortisol level normal. Workup suggested SIADH. Saw nephrology, tolvaptan  x 1 provided, sodium trending up, was released home.  Chlorthalidone  stopped. Plan: Check CMP.  Further advised for result. L community-acquired pneumonia:  - Chest x-ray negative but PNM was seen on CT scan of the chest. - Had mild cough prior to admission and continue with it.  On exam she is still have some crackles at the left base. - S/p Rocephin , Zithromax . - Plan: Chest x-ray in 3 days (when she temporarily remove the heart monitor), consider a second round of antibiotics. New onset A-fib with RVR: In the setting of profound electrolyte abnormalities.  A-fib improved after she was put back on metoprolol , one of her routine meds, they got the opinion from cardiology, no need for anticoagulation.  Has a monitor in place.  No symptoms, seems to be in sinus rhythm today. HTN: Upon admission, chlorthalidone  was stopped permanently, Avapro  is on hold, on metoprolol .  BP this morning 141/84.  BP 138/88 when rechecked today.  Plan: Continue holding Avapro , continue metoprolol .  Monitor BPs at home, restart medications as needed Anemia: Noted during the admission, recheck CBC anemia panel.  Had GI symptoms prior to admission, they resolved, last colonoscopy in 2017.  Further advise w/ results Fatigue  Still feels fatigued, likely multifactorial.   Increased LFTs: Rechecking today High cholesterol: Holding statins. RTC 3 weeks

## 2023-08-26 ENCOUNTER — Encounter: Payer: Self-pay | Admitting: Internal Medicine

## 2023-08-26 ENCOUNTER — Ambulatory Visit (HOSPITAL_BASED_OUTPATIENT_CLINIC_OR_DEPARTMENT_OTHER)
Admission: RE | Admit: 2023-08-26 | Discharge: 2023-08-26 | Disposition: A | Discharge status: Home / Self Care | Source: Ambulatory Visit | Attending: Internal Medicine | Admitting: Internal Medicine

## 2023-08-26 DIAGNOSIS — J189 Pneumonia, unspecified organism: Secondary | ICD-10-CM | POA: Insufficient documentation

## 2023-08-26 DIAGNOSIS — Z0189 Encounter for other specified special examinations: Secondary | ICD-10-CM | POA: Diagnosis not present

## 2023-08-29 ENCOUNTER — Encounter: Payer: Self-pay | Admitting: Internal Medicine

## 2023-09-06 ENCOUNTER — Ambulatory Visit: Attending: Nurse Practitioner | Admitting: Nurse Practitioner

## 2023-09-06 ENCOUNTER — Encounter: Payer: Self-pay | Admitting: Nurse Practitioner

## 2023-09-06 VITALS — BP 128/70 | HR 57 | Ht 63.0 in | Wt 176.6 lb

## 2023-09-06 DIAGNOSIS — I480 Paroxysmal atrial fibrillation: Secondary | ICD-10-CM | POA: Diagnosis not present

## 2023-09-06 DIAGNOSIS — I6523 Occlusion and stenosis of bilateral carotid arteries: Secondary | ICD-10-CM | POA: Diagnosis not present

## 2023-09-06 DIAGNOSIS — E119 Type 2 diabetes mellitus without complications: Secondary | ICD-10-CM

## 2023-09-06 DIAGNOSIS — E785 Hyperlipidemia, unspecified: Secondary | ICD-10-CM

## 2023-09-06 DIAGNOSIS — I10 Essential (primary) hypertension: Secondary | ICD-10-CM

## 2023-09-06 NOTE — Progress Notes (Signed)
 Office Visit    Patient Name: Casey Bryan Date of Encounter: 09/06/2023  Primary Care Provider:  Ezell Hollow, MD Primary Cardiologist:  Gaylyn Keas, MD  Chief Complaint    78 year old female with a history of paroxysmal atrial fibrillation, hypertension, hyperlipidemia, type 2 diabetes, amaurosis fugax, carotid artery stenosis, glaucoma, and GERD who presents for hospital follow-up related to atrial fibrillation.  Past Medical History    Past Medical History:  Diagnosis Date   Amaurosis fugax 12/12/2015   Diabetes mellitus    GERD (gastroesophageal reflux disease)    Glaucoma 04-2012   Hyperlipemia    Hypertension    Past Surgical History:  Procedure Laterality Date   NO PAST SURGERIES      Allergies  Allergies  Allergen Reactions   Prevnar [Pneumococcal 13-Val Conj Vacc] Nausea And Vomiting and Other (See Comments)    Fever of 102.    Clarithromycin  [Clarithromycin ] Other (See Comments)    Contradictions w/Vytorin    Tetanus-Diphtheria Toxoids Td Nausea And Vomiting    REACTION: see OV note  02-11-10     Labs/Other Studies Reviewed    The following studies were reviewed today:  Cardiac Studies & Procedures   ______________________________________________________________________________________________     ECHOCARDIOGRAM  ECHOCARDIOGRAM COMPLETE 08/16/2023  Narrative ECHOCARDIOGRAM REPORT    Patient Name:   Casey Bryan Date of Exam: 08/16/2023 Medical Rec #:  [id number]     Height:       64.0 in Accession #:    [id number]    Weight:       167.8 lb Date of Birth:  1946/04/03      BSA:          1.816 m Patient Age:    77 years      BP:           174/51 mmHg Patient Gender: F             HR:           85 bpm. Exam Location:  Inpatient  Procedure: 2D Echo, Cardiac Doppler and Color Doppler (Both Spectral and Color Flow Doppler were utilized during procedure).  Indications:    I48.91* Unspeicified atrial fibrillation  History:        Patient has  prior history of Echocardiogram examinations, most recent 04/09/2015. Arrythmias:Atrial Fibrillation; Risk Factors:Hypertension and Diabetes.  Sonographer:    Andrena Bang Referring Phys: [id number] [facility] GOEL  IMPRESSIONS   1. Left ventricular ejection fraction, by estimation, is 55 to 60%. The left ventricle has normal function. The left ventricle has no regional wall motion abnormalities. Left ventricular diastolic parameters were normal. 2. Right ventricular systolic function is normal. The right ventricular size is normal. There is normal pulmonary artery systolic pressure. The estimated right ventricular systolic pressure is 31.7 mmHg. 3. Left atrial size was moderately dilated. 4. The mitral valve is normal in structure. Mild mitral valve regurgitation. 5. The aortic valve is tricuspid. There is mild calcification of the aortic valve. Aortic valve regurgitation is not visualized. Aortic valve sclerosis/calcification is present, without any evidence of aortic stenosis. 6. The inferior vena cava is normal in size with greater than 50% respiratory variability, suggesting right atrial pressure of 3 mmHg.  Comparison(s): Prior images unable to be directly viewed, comparison made by report only.  FINDINGS Left Ventricle: Left ventricular ejection fraction, by estimation, is 55 to 60%. The left ventricle has normal function. The left ventricle has no regional wall motion abnormalities. The left ventricular internal  cavity size was normal in size. There is no left ventricular hypertrophy. Left ventricular diastolic parameters were normal.  Right Ventricle: The right ventricular size is normal. No increase in right ventricular wall thickness. Right ventricular systolic function is normal. There is normal pulmonary artery systolic pressure. The tricuspid regurgitant velocity is 2.68 m/s, and with an assumed right atrial pressure of 3 mmHg, the estimated right ventricular systolic pressure is 31.7  mmHg.  Left Atrium: Left atrial size was moderately dilated.  Right Atrium: Right atrial size was normal in size.  Pericardium: There is no evidence of pericardial effusion.  Mitral Valve: The mitral valve is normal in structure. Mild mitral valve regurgitation, with centrally-directed jet.  Tricuspid Valve: The tricuspid valve is normal in structure. Tricuspid valve regurgitation is trivial.  Aortic Valve: The aortic valve is tricuspid. There is mild calcification of the aortic valve. Aortic valve regurgitation is not visualized. Aortic valve sclerosis/calcification is present, without any evidence of aortic stenosis. Aortic valve mean gradient measures 11.0 mmHg. Aortic valve peak gradient measures 19.2 mmHg. Aortic valve area, by VTI measures 1.55 cm.  Pulmonic Valve: The pulmonic valve was grossly normal. Pulmonic valve regurgitation is not visualized. No evidence of pulmonic stenosis.  Aorta: The aortic root is normal in size and structure.  Venous: The inferior vena cava is normal in size with greater than 50% respiratory variability, suggesting right atrial pressure of 3 mmHg.  IAS/Shunts: No atrial level shunt detected by color flow Doppler.   LEFT VENTRICLE PLAX 2D LVIDd:         4.60 cm      Diastology LVIDs:         3.50 cm      LV e' medial:    10.70 cm/s LV PW:         1.10 cm      LV E/e' medial:  10.9 LV IVS:        1.10 cm      LV e' lateral:   10.30 cm/s LVOT diam:     1.90 cm      LV E/e' lateral: 11.3 LV SV:         58 LV SV Index:   32 LVOT Area:     2.84 cm  LV Volumes (MOD) LV vol d, MOD A2C: 100.0 ml LV vol d, MOD A4C: 110.0 ml LV vol s, MOD A2C: 39.1 ml LV vol s, MOD A4C: 37.7 ml LV SV MOD A2C:     60.9 ml LV SV MOD A4C:     110.0 ml LV SV MOD BP:      66.7 ml  RIGHT VENTRICLE RV S prime:     15.50 cm/s TAPSE (M-mode): 2.3 cm  LEFT ATRIUM             Index LA diam:        3.90 cm 2.15 cm/m LA Vol (A2C):   85.7 ml 47.20 ml/m LA Vol (A4C):    68.2 ml 37.56 ml/m LA Biplane Vol: 79.8 ml 43.95 ml/m AORTIC VALVE AV Area (Vmax):    1.49 cm AV Area (Vmean):   1.37 cm AV Area (VTI):     1.55 cm AV Vmax:           219.00 cm/s AV Vmean:          152.000 cm/s AV VTI:            0.376 m AV Peak Grad:      19.2 mmHg  AV Mean Grad:      11.0 mmHg LVOT Vmax:         115.00 cm/s LVOT Vmean:        73.200 cm/s LVOT VTI:          0.205 m LVOT/AV VTI ratio: 0.55  AORTA Ao Asc diam: 3.30 cm  MITRAL VALVE                TRICUSPID VALVE MV Area (PHT): 5.36 cm     TR Peak grad:   28.7 mmHg MV Decel Time: 142 msec     TR Vmax:        268.00 cm/s MV E velocity: 116.50 cm/s MV A velocity: 72.20 cm/s   SHUNTS MV E/A ratio:  1.61         Systemic VTI:  0.20 m Systemic Diam: 1.90 cm  Luana Rumple MD Electronically signed by Luana Rumple MD Signature Date/Time: 08/16/2023/4:41:50 PM    Final          ______________________________________________________________________________________________     Recent Labs: 08/16/2023: B Natriuretic Peptide 582.2; Magnesium  2.1; TSH 0.794 08/23/2023: ALT 23; BUN 24; Creatinine, Ser 1.02; Hemoglobin 10.0; Platelets 399.0; Potassium 4.8; Sodium 135  Recent Lipid Panel    Component Value Date/Time   CHOL 129 06/24/2023 1557   TRIG 73 06/24/2023 1557   TRIG 52 03/15/2006 1019   HDL 53 06/24/2023 1557   CHOLHDL 2.4 06/24/2023 1557   VLDL 15.8 06/08/2022 1042   LDLCALC 61 06/24/2023 1557    History of Present Illness    78 year old female with the above past medical history including paroxysmal atrial fibrillation, hypertension, hyperlipidemia, type 2 diabetes, amaurosis fugax, carotid artery stenosis, glaucoma, and GERD.  She presented to the ED on 08/15/2023 in the setting of severe electrolyte abnormalities (sodium 115, potassium 2.9, magnesium  0.9, serum creatinine 1.24, hemoglobin 10.8) UTI, pneumonia.  EKG in the ED showed sinus rhythm with PACs.  Chest x-ray was consistent with  increased pulmonary vascular congestion, interstitial prominence.  CT of the abdomen showed left lower lobe pneumonia.  She was admitted.  And treated with antibiotics, IV fluids.  She developed new onset atrial fibrillation with RVR.  Cardiology was consulted.  She converted to normal sinus rhythm.  TSH was normal.  Echocardiogram showed EF 55 to 60%, no RWMA, normal RV systolic function, normal PA pressure, mild MR.  Anticoagulation was deferred due to single episode of atrial fibrillation, transient in the setting of marked metabolic abnormalities.  Outpatient 30-day event monitor was ordered and is pending.  Nephrology was consulted and recommended discontinuing chlorthalidone  indefinitely.  She was discharged home in stable vision on 08/18/2023.  She presents today for follow-up accompanied by her daughter. Since her hospitalization she has been stable from a cardiac standpoint.  She has noticed some ongoing fatigue, intermittent confusion.  She has stable nonpitting bilateral lower extremity edema, this is chronic.  She is currently wearing her monitor.  She has had some mild skin irritation from the adhesive.   Home Medications    Current Outpatient Medications  Medication Sig Dispense Refill   aspirin  EC 81 MG tablet Take 81 mg by mouth daily.     Calcium  Carbonate-Vit D-Min (CALCIUM  1200 PO) Take 1 capsule by mouth daily at 12 noon.     esomeprazole  (NEXIUM ) 40 MG capsule Take 1 capsule (40 mg total) by mouth 2 (two) times daily before a meal. 180 capsule 1   metFORMIN  (GLUCOPHAGE ) 1000 MG tablet Take 1 tablet (  1,000 mg total) by mouth 2 (two) times daily with a meal. 180 tablet 1   metoprolol  tartrate (LOPRESSOR ) 50 MG tablet Take 2 tablets (100 mg total) by mouth 2 (two) times daily. 360 tablet 1   Multiple Vitamin (MULTIVITAMIN) tablet Take 1 tablet by mouth daily.     Multiple Vitamins-Minerals (ICAPS AREDS 2 PO) Take 1 capsule by mouth in the morning and at bedtime.     Probiotic Product  (PROBIOTIC ADVANCED PO) Take 1 tablet by mouth daily.     No current facility-administered medications for this visit.     Review of Systems    She denies chest pain, palpitations, dyspnea, pnd, orthopnea, n, v, dizziness, syncope, edema, weight gain, or early satiety. All other systems reviewed and are otherwise negative except as noted above.   Physical Exam    VS:  BP 128/70 (BP Location: Left Arm, Patient Position: Sitting, Cuff Size: Normal)   Pulse (!) 57   Ht 5\' 3"  (1.6 m)   Wt 176 lb 9.6 oz (80.1 kg)   SpO2 96%   BMI 31.28 kg/m   GEN: Well nourished, well developed, in no acute distress. HEENT: normal. Neck: Supple, no JVD, carotid bruits, or masses. Cardiac: RRR, no murmurs, rubs, or gallops. No clubbing, cyanosis, nonpitting bilateral lower extremity edema.  Radials/DP/PT 2+ and equal bilaterally.  Respiratory:  Respirations regular and unlabored, clear to auscultation bilaterally. GI: Soft, nontender, nondistended, BS + x 4. MS: no deformity or atrophy. Skin: warm and dry, no rash. Neuro:  Strength and sensation are intact. Psych: Normal affect.  Accessory Clinical Findings    ECG personally reviewed by me today - EKG Interpretation Date/Time:  Tuesday Sep 06 2023 14:56:27 EDT Ventricular Rate:  57 PR Interval:  158 QRS Duration:  82 QT Interval:  418 QTC Calculation: 406 R Axis:   -11  Text Interpretation: Sinus bradycardia Minimal voltage criteria for LVH, may be normal variant ( R in aVL ) When compared with ECG of 15-Aug-2023 21:42, Sinus rhythm has replaced Atrial fibrillation Vent. rate has decreased BY  45 BPM T wave amplitude has increased in Lateral leads Confirmed by Marlana Silvan ([id number]) on 09/06/2023 3:14:47 PM  - no acute changes.   Lab Results  Component Value Date   WBC 5.0 08/23/2023   HGB 10.0 (L) 08/23/2023   HCT 29.3 (L) 08/23/2023   MCV 86.0 08/23/2023   PLT 399.0 08/23/2023   Lab Results  Component Value Date   CREATININE 1.02  08/23/2023   BUN 24 (H) 08/23/2023   NA 135 08/23/2023   K 4.8 08/23/2023   CL 98 08/23/2023   CO2 28 08/23/2023   Lab Results  Component Value Date   ALT 23 08/23/2023   AST 16 08/23/2023   ALKPHOS 48 08/23/2023   BILITOT 0.4 08/23/2023   Lab Results  Component Value Date   CHOL 129 06/24/2023   HDL 53 06/24/2023   LDLCALC 61 06/24/2023   TRIG 73 06/24/2023   CHOLHDL 2.4 06/24/2023    Lab Results  Component Value Date   HGBA1C 6.0 (H) 06/24/2023    Assessment & Plan    1. Paroxysmal atrial fibrillation: Occurred in the setting of acute illness (left lower lobe pneumonia).  Anticoagulation was deferred due to single episode, transient, in the setting of marked metabolic abnormalities, acute illness.  Outpatient 30-day event monitor is pending.  She has noted some mild skin irritation from the adhesive.  Advised her to reach out to representative,  wear as long as tolerated.  Maintaining sinus rhythm.  She denies any palpitations.  She has noticed some ongoing mild fatigue, intermittent confusion.  Given recent hyponatremia, ongoing confusion, will check BMET.  2. Hypertension: BP well controlled.  Irbesartan  was discontinued during recent hospitalization.  Continue to monitor BP report becomes again 130/80.   3. Hyperlipidemia: LDL was 61 in 06/2023.  Lipitor was recently held in the setting of elevated CK.  Will repeat CK.  Recommend follow-up with PCP for ongoing monitoring of labs, decision regarding resumption of statin therapy.  4. Type 2 diabetes: A1c was 6.0 in 06/2023.  Monitored and managed per PCP.  5. Carotid artery stenosis/amaurosis fugax: Carotid ultrasound in 07/2020 showed minimal plaque.  Resume Lipitor as able, continue aspirin .  6. Disposition: Recommend follow-up in 3 months with APP. She is requesting to switch cardiologists from Dr. Micael Adas to Dr. Stann Earnest (her husband was a patient of Dr. Stann Earnest).  I will reach out to both providers to see if they are agreeable  to this change.       Jude Norton, NP 09/06/2023, 3:40 PM

## 2023-09-06 NOTE — Patient Instructions (Signed)
 Medication Instructions:  Your physician recommends that you continue on your current medications as directed. Please refer to the Current Medication list given to you today.   *If you need a refill on your cardiac medications before your next appointment, please call your pharmacy*  Lab Work: BMET, CBC, CK   Testing/Procedures: NONE ordered at this time of appointment   Follow-Up: At [facility], you and your health needs are our priority.  As part of our continuing mission to provide you with exceptional heart care, our providers are all part of one team.  This team includes your primary Cardiologist (physician) and Advanced Practice Providers or APPs (Physician Assistants and Nurse Practitioners) who all work together to provide you with the care you need, when you need it.  Your next appointment:   3 month(s)  Provider:   Liane Redman, PA-C or Marlana Silvan, NP          We recommend signing up for the patient portal called "MyChart".  Sign up information is provided on this After Visit Summary.  MyChart is used to connect with patients for Virtual Visits (Telemedicine).  Patients are able to view lab/test results, encounter notes, upcoming appointments, etc.  Non-urgent messages can be sent to your provider as well.   To learn more about what you can do with MyChart, go to [email or web address].   Other Instructions

## 2023-09-07 ENCOUNTER — Ambulatory Visit: Payer: Self-pay | Admitting: Nurse Practitioner

## 2023-09-07 LAB — CBC
Hematocrit: 32.1 % — ABNORMAL LOW (ref 34.0–46.6)
Hemoglobin: 10.4 g/dL — ABNORMAL LOW (ref 11.1–15.9)
MCH: 29.4 pg (ref 26.6–33.0)
MCHC: 32.4 g/dL (ref 31.5–35.7)
MCV: 91 fL (ref 79–97)
Platelets: 246 10*3/uL (ref 150–450)
RBC: 3.54 x10E6/uL — ABNORMAL LOW (ref 3.77–5.28)
RDW: 14.2 % (ref 11.7–15.4)
WBC: 3.6 10*3/uL (ref 3.4–10.8)

## 2023-09-07 LAB — BASIC METABOLIC PANEL WITH GFR
BUN/Creatinine Ratio: 20 (ref 12–28)
BUN: 18 mg/dL (ref 8–27)
CO2: 23 mmol/L (ref 20–29)
Calcium: 9.5 mg/dL (ref 8.7–10.3)
Chloride: 101 mmol/L (ref 96–106)
Creatinine, Ser: 0.91 mg/dL (ref 0.57–1.00)
Glucose: 91 mg/dL (ref 70–99)
Potassium: 5.3 mmol/L — ABNORMAL HIGH (ref 3.5–5.2)
Sodium: 139 mmol/L (ref 134–144)
eGFR: 65 mL/min/{1.73_m2} (ref >59)

## 2023-09-07 LAB — CK: Total CK: 39 U/L (ref 32–182)

## 2023-09-08 ENCOUNTER — Other Ambulatory Visit: Payer: Self-pay

## 2023-09-08 ENCOUNTER — Encounter: Payer: Self-pay | Admitting: Nurse Practitioner

## 2023-09-08 DIAGNOSIS — E785 Hyperlipidemia, unspecified: Secondary | ICD-10-CM

## 2023-09-08 DIAGNOSIS — Z79899 Other long term (current) drug therapy: Secondary | ICD-10-CM

## 2023-09-08 DIAGNOSIS — I10 Essential (primary) hypertension: Secondary | ICD-10-CM

## 2023-09-13 ENCOUNTER — Ambulatory Visit (INDEPENDENT_AMBULATORY_CARE_PROVIDER_SITE_OTHER): Admitting: Internal Medicine

## 2023-09-13 ENCOUNTER — Encounter: Payer: Self-pay | Admitting: Internal Medicine

## 2023-09-13 VITALS — BP 138/86 | HR 68 | Temp 98.1°F | Resp 16 | Ht 63.0 in | Wt 183.4 lb

## 2023-09-13 DIAGNOSIS — E119 Type 2 diabetes mellitus without complications: Secondary | ICD-10-CM | POA: Diagnosis not present

## 2023-09-13 DIAGNOSIS — E559 Vitamin D deficiency, unspecified: Secondary | ICD-10-CM | POA: Diagnosis not present

## 2023-09-13 DIAGNOSIS — Z794 Long term (current) use of insulin: Secondary | ICD-10-CM

## 2023-09-13 DIAGNOSIS — G3184 Mild cognitive impairment, so stated: Secondary | ICD-10-CM

## 2023-09-13 DIAGNOSIS — F4321 Adjustment disorder with depressed mood: Secondary | ICD-10-CM | POA: Diagnosis not present

## 2023-09-13 MED ORDER — ESCITALOPRAM OXALATE 10 MG PO TABS
10.0000 mg | ORAL_TABLET | Freq: Every day | ORAL | 0 refills | Status: DC
Start: 2023-09-13 — End: 2023-10-26

## 2023-09-13 MED ORDER — ATORVASTATIN CALCIUM 80 MG PO TABS: 80.0000 mg | ORAL_TABLET | Freq: Every day | ORAL | 1 refills | Status: DC

## 2023-09-13 NOTE — Progress Notes (Signed)
 Subjective:    Patient ID: Casey Bryan, female    DOB: 1945/12/09, 78 y.o.   MRN: [id number]  DOS:  09/13/2023 Type of visit - description: Follow-up, here with her daughter  Since the last office visit, saw cardiology, note reviewed. The patient's family is worried about Casey Bryan. "She is not herself". On and off forgetful, repeats herself. She is not social, not talking much. Sometimes she is unable to sleep. They worry  about a stroke, they think she is depressed, they think she may be developing dementia.  Denies diplopia, slurred speech, face numbness or face weakness.  She does have some headaches.   Review of Systems See above   Past Medical History:  Diagnosis Date   Amaurosis fugax 12/12/2015   Diabetes mellitus    GERD (gastroesophageal reflux disease)    Glaucoma 04-2012   Hyperlipemia    Hypertension     Past Surgical History:  Procedure Laterality Date   NO PAST SURGERIES      Current Outpatient Medications  Medication Instructions   aspirin  EC 81 mg, Daily   Calcium  Carbonate-Vit D-Min (CALCIUM  1200 PO) 1 capsule, Daily   esomeprazole  (NEXIUM ) 40 mg, Oral, 2 times daily before meals   metFORMIN  (GLUCOPHAGE ) 1,000 mg, Oral, 2 times daily with meals   metoprolol  tartrate (LOPRESSOR ) 100 mg, Oral, 2 times daily   Multiple Vitamin (MULTIVITAMIN) tablet 1 tablet, Daily   Multiple Vitamins-Minerals (ICAPS AREDS 2 PO) 1 capsule, 2 times daily   Probiotic Product (PROBIOTIC ADVANCED PO) 1 tablet, Daily       Objective:   Physical Exam BP 138/86   Pulse 68   Temp 98.1 F (36.7 C) (Oral)   Resp 16   Ht 5\' 3"  (1.6 m)   Wt 183 lb 6 oz (83.2 kg)   SpO2 96%   BMI 32.48 kg/m  General:   Well developed, NAD, BMI noted. HEENT:  Normocephalic . Face symmetric, atraumatic Lungs:  CTA B Normal respiratory effort, no intercostal retractions, no accessory muscle use. Heart: RRR,  no murmur.  Lower extremities: no pretibial edema bilaterally  Skin: Not pale.  Not jaundice Neurologic:  alert & oriented to self, space and time.Casey Bryan  Speech normal, gait appropriate for age and unassisted.  Motor symmetric Psych--  Cognition and judgment appear intact.  Cooperative with normal attention span and concentration.  Behavior appropriate. Become tearful immediately after we started talking about her husband.     Assessment   Assessment: DM HTN (intolerant amlodipine  11-2015 and 2018) Hyperlipidemia Glaucoma dx 2014, cataracts NEURO: L amaurosix fugax 03-2015, CVA -- saw opthalmology, started ASA  (02-2015); echo 04-09-15: nl EF, grade 2 Diast disfx -- saw neuro: Probable cryptogenic  --Headaches: Sees  neurology   CV: + FH CV Dz Carotid artery disease:    US  04-2015: Progression of the right ICA now 40-59%. Stable left ICA 1-39%. US  02/2018, 07-30-2020 : carotids essentially normal GI: -GERD -Anemia: EGD colonoscopy 2017, Dr. Denece Finger,  [place] polyp removed   PLAN: Paroxysmal A-fib: Since LOV, saw cardiology.  Was in sinus rhythm, a  30-day outpatient event monitor pending. They check a BMP, potassium 5.3. HTN: On atenolol, last potassium is slightly elevated, recheck. DM: On metformin , last A1c okay, will recheck today. High cholesterol: Restart atorvastatin  80 mg. MCI?  Depression? The patient is 89, lost her husband of more than 50 years few months ago, she is clearly depressed, PHQ-9: 14. I asked how often she thinks of her husband, she said "  all the time".  Become tearful during the exam today. Also, she likely has an element of MCI. Plan:  Brain MRI, B12, folic acid  Start Lexapro 10 mg daily, Rx sent, strongly encouraged to see a counselor. Vitamin D  deficiency: Check levels RTC 2 months.

## 2023-09-13 NOTE — Patient Instructions (Signed)
 Recommend to see a counselor  Restart atorvastatin  80 mg 1 tablet daily  Start Lexapro  10 mg daily.  This is to help with depression.  Will schedule a brain MRI    GO TO THE LAB :  Get the blood work   Your results will be posted on MyChart with my comments  Next office visit for a checkup in 2 months.  Sooner if needed. Please make an appointment before you leave today

## 2023-09-13 NOTE — Assessment & Plan Note (Signed)
 Paroxysmal A-fib: Since LOV, saw cardiology.  Was in sinus rhythm, a  30-day outpatient event monitor pending. They check a BMP, potassium 5.3. HTN: On atenolol, last potassium is slightly elevated, recheck. DM: On metformin , last A1c okay, will recheck today. High cholesterol: Restart atorvastatin  80 mg. MCI?  Depression? The patient is 89, lost her husband of more than 50 years few months ago, she is clearly depressed, PHQ-9: 14. I asked how often she thinks of her husband, she said "all the time".  Become tearful during the exam today. Also, she likely has an element of MCI. Plan:  Brain MRI, B12, folic acid  Start Lexapro 10 mg daily, Rx sent, strongly encouraged to see a counselor. Vitamin D  deficiency: Check levels RTC 2 months.

## 2023-09-14 LAB — BASIC METABOLIC PANEL WITH GFR
BUN: 22 mg/dL (ref 6–23)
CO2: 29 meq/L (ref 19–32)
Calcium: 9 mg/dL (ref 8.4–10.5)
Chloride: 101 meq/L (ref 96–112)
Creatinine, Ser: 0.95 mg/dL (ref 0.40–1.20)
GFR: 58 mL/min — ABNORMAL LOW (ref >60.00)
Glucose, Bld: 180 mg/dL — ABNORMAL HIGH (ref 70–99)
Potassium: 4 meq/L (ref 3.5–5.1)
Sodium: 137 meq/L (ref 135–145)

## 2023-09-14 LAB — HEMOGLOBIN A1C: Hgb A1c MFr Bld: 5.7 % (ref 4.6–6.5)

## 2023-09-14 LAB — B12 AND FOLATE PANEL
Folate: >23.2 ng/mL (ref >5.9)
Vitamin B-12: 112 pg/mL — ABNORMAL LOW (ref 211–911)

## 2023-09-14 LAB — VITAMIN D 25 HYDROXY (VIT D DEFICIENCY, FRACTURES): VITD: 29.7 ng/mL — ABNORMAL LOW (ref 30.00–100.00)

## 2023-09-14 NOTE — Progress Notes (Signed)
 Noted. Spoke with pt 09/14/23 @ 9:04 AM. Pt is aware that Dr. Micael Adas and Dr. Stann Earnest agreed to switch primary cardiologist to Dr. Stann Earnest.

## 2023-09-15 ENCOUNTER — Encounter: Payer: Self-pay | Admitting: Family Medicine

## 2023-09-15 ENCOUNTER — Ambulatory Visit: Payer: Self-pay | Admitting: Internal Medicine

## 2023-09-15 ENCOUNTER — Ambulatory Visit (HOSPITAL_BASED_OUTPATIENT_CLINIC_OR_DEPARTMENT_OTHER)
Admission: RE | Admit: 2023-09-15 | Discharge: 2023-09-15 | Disposition: A | Discharge status: Home / Self Care | Source: Ambulatory Visit | Attending: Family Medicine | Admitting: Family Medicine

## 2023-09-15 ENCOUNTER — Ambulatory Visit: Admitting: Family Medicine

## 2023-09-15 ENCOUNTER — Ambulatory Visit: Payer: Self-pay

## 2023-09-15 ENCOUNTER — Ambulatory Visit (HOSPITAL_BASED_OUTPATIENT_CLINIC_OR_DEPARTMENT_OTHER): Admission: RE | Admit: 2023-09-15 | Source: Ambulatory Visit

## 2023-09-15 ENCOUNTER — Ambulatory Visit: Payer: Self-pay | Admitting: Family Medicine

## 2023-09-15 VITALS — BP 149/63 | HR 70 | Ht 63.0 in | Wt 175.0 lb

## 2023-09-15 DIAGNOSIS — R4189 Other symptoms and signs involving cognitive functions and awareness: Secondary | ICD-10-CM | POA: Insufficient documentation

## 2023-09-15 DIAGNOSIS — I6523 Occlusion and stenosis of bilateral carotid arteries: Secondary | ICD-10-CM

## 2023-09-15 DIAGNOSIS — R4689 Other symptoms and signs involving appearance and behavior: Secondary | ICD-10-CM | POA: Diagnosis not present

## 2023-09-15 DIAGNOSIS — E538 Deficiency of other specified B group vitamins: Secondary | ICD-10-CM

## 2023-09-15 DIAGNOSIS — G4452 New daily persistent headache (NDPH): Secondary | ICD-10-CM | POA: Insufficient documentation

## 2023-09-15 DIAGNOSIS — R519 Headache, unspecified: Secondary | ICD-10-CM | POA: Diagnosis not present

## 2023-09-15 DIAGNOSIS — R110 Nausea: Secondary | ICD-10-CM | POA: Diagnosis not present

## 2023-09-15 DIAGNOSIS — I10 Essential (primary) hypertension: Secondary | ICD-10-CM | POA: Diagnosis not present

## 2023-09-15 DIAGNOSIS — R030 Elevated blood-pressure reading, without diagnosis of hypertension: Secondary | ICD-10-CM | POA: Diagnosis not present

## 2023-09-15 MED ORDER — VITAMIN D (ERGOCALCIFEROL) 1.25 MG (50000 UNIT) PO CAPS: 50000.0000 [IU] | ORAL_CAPSULE | ORAL | 0 refills | Status: AC

## 2023-09-15 MED ORDER — CYANOCOBALAMIN 1000 MCG/ML IJ SOLN
1000.0000 ug | Freq: Once | INTRAMUSCULAR | Status: AC
  Administered 2023-09-15: 1000 ug via INTRAMUSCULAR

## 2023-09-15 NOTE — Assessment & Plan Note (Signed)
 Mildly elevated on repeat today.  Continue current management and monitor at home. Follow-up pending results (see below).  Patient aware of signs/symptoms requiring further/urgent evaluation.

## 2023-09-15 NOTE — Progress Notes (Signed)
 Acute Office Visit  Subjective:     Patient ID: Casey Bryan, female    DOB: 1946/01/08, 78 y.o.   MRN: [id number]  Chief Complaint  Patient presents with   Headache     Patient is in today for headaches, nausea/vomiting, behavior/cognitive changes. She is here with her daughter.    Discussed the use of AI scribe software for clinical note transcription with the patient, who gave verbal consent to proceed.  History of Present Illness Casey Bryan is a 78 year old female who presents with concerns of medication reaction and recent exacerbation of symptoms.  She experienced a severe headache, nausea, and vomiting last night, similar to an episode in late April when she was hospitalized. During that time, she was initially suspected to have a UTI, but the culture was negative. She was later diagnosed with pneumonia and had low potassium and magnesium  levels. Her main complaints were headache, nausea, vomiting, and profuse sweating. Last night's episode included vomiting four times, profuse sweating, and a persistent headache. She has not slept since the episode.  She started taking Lexapro  yesterday around noon, and there is concern that her symptoms may be a reaction to the medication. She took Tylenol  for the headache, which is now better, although she vomited after taking it. Her blood pressure was recorded at 200/84 this morning, and her blood sugar was 200. Previously, her blood pressure was 134/86.  She has been experiencing daily headaches, primarily located at the back of her head. These headaches have been persistent since her hospital visit in April. She has a history of seeing a neurologist for persistent headaches and an episode of temporary blindness in one eye, which was deemed an isolated incident. Her carotid arteries were being monitored, but follow-up has not continued.  There are concerns about cognitive changes and memory issues, which have been gradually worsening  since April. She exhibits abnormal behavior, such as taking items and not understanding why, and has been slower in her actions. Her B12 levels were found to be very low, which may contribute to her cognitive issues. PCP wanted her to start B12 injections and has ordered an MRI Brain which is scheduled for next Monday.   No current urinary symptoms, vision changes, or weakness on one side of her body. Her headaches are typically worse at night and are described as being in the middle of the back of her head. She rates her current headache pain as a 3 out of 10.     All review of systems negative except what is listed in the HPI      Objective:    BP (!) 149/63   Pulse 70   Ht 5\' 3"  (1.6 m)   Wt 175 lb (79.4 kg)   SpO2 97%   BMI 31.00 kg/m    Physical Exam Vitals reviewed.  Constitutional:      Appearance: Normal appearance.  Eyes:     General: No visual field deficit.    Extraocular Movements: Extraocular movements intact.     Right eye: Normal extraocular motion.     Left eye: Normal extraocular motion.     Pupils: Pupils are equal, round, and reactive to light.  Cardiovascular:     Rate and Rhythm: Normal rate and regular rhythm.     Heart sounds: Normal heart sounds.  Pulmonary:     Effort: Pulmonary effort is normal.     Breath sounds: Normal breath sounds.  Skin:    General:  Skin is warm and dry.  Neurological:     Mental Status: She is alert and oriented to person, place, and time.     Cranial Nerves: No cranial nerve deficit, dysarthria or facial asymmetry.     Motor: No weakness.     Coordination: Coordination normal.  Psychiatric:        Mood and Affect: Mood normal.        Speech: Speech normal.        Behavior: Behavior normal.        Thought Content: Thought content normal.        Judgment: Judgment normal.     No results found for any visits on 09/15/23.      Assessment & Plan:   Problem List Items Addressed This Visit       Active Problems    Primary hypertension   Mildly elevated on repeat today.  Continue current management and monitor at home. Follow-up pending results (see below).  Patient aware of signs/symptoms requiring further/urgent evaluation.       Carotid artery stenosis   Relevant Orders   US  Carotid Bilateral   B12 deficiency   Other Visit Diagnoses       New persistent daily headache    -  Primary   Relevant Orders   CT HEAD WO CONTRAST ( )   US  Carotid Bilateral     Nausea       Relevant Orders   CT HEAD WO CONTRAST ( )     Cognitive and behavioral changes       Relevant Orders   CT HEAD WO CONTRAST ( )      Assessment & Plan Persistent headache and nausea Persistent headaches with occasional nausea and vomiting since April, post-hospitalization.  - Order stat CT scan of the head to rule out acute intracranial pathology. Daughter is concerned about TIAs. - Proceed with scheduled MRI on Monday per PCP orders. - Consider reducing Lexapro  dose to 5 mg if symptoms persist, or discontinue if symptoms are unbearable. Given that symptoms have been ongoing longer (only tried one dose of Lexapro  so far), medication reaction less likely. Patient aware of signs/symptoms requiring further/urgent evaluation.  - Advise returning to the hospital if severe symptoms do not resolve.  Cognitive changes and memory issues Cognitive changes and memory issues since April, post-hospitalization. Differential includes B12 deficiency, medication side effects, or neurological issues. - Administer B12 injection today and continue weekly for one month, then monthly per PCP result note. - Order carotid ultrasound to evaluate for carotid artery disease - per last neuro note several years ago, this was to be monitored regularly. Last US  was stable 3 years ago.  - Consider neurology referral based on imaging results and symptom progression.  Vitamin B12 deficiency Low B12 levels potentially contributing to cognitive  changes and memory issues. - Administer B12 injection today and continue weekly for one month, then monthly.     Other labs recently stable at PCP visit earlier this week.    Meds ordered this encounter  Medications   cyanocobalamin  (VITAMIN B12) injection 1,000 mcg    Return if symptoms worsen or fail to improve.  Everlina Hock, NP

## 2023-09-15 NOTE — Telephone Encounter (Signed)
 Caller initially does not have pt present on the line.  Chief Complaint: HA Symptoms: Ha to back of head, N/V, photophobia,  Frequency: 3-4 weeks Pertinent Negatives: Patient denies vision changes, speech changes, CMS changes, fever, head injury,  Disposition: [] ED /[] Urgent Care (no appt availability in office) / [x] Appointment(In office/virtual)/ []  [facility]/ [] Home Care/ [] Refused Recommended Disposition /[] [facility]/ []  Follow-up with PCP Additional Notes: Pt daughter calling, eventually mother was added to call. Pt states that she started lexapro yesterday and started vomiting. Pt states that she has been having intermittent HA's for about 3-4 weeks. Pt states tylenol  usually helps but today she vomited the tylenol  up. Pt scheduled today.  Copied from CRM [id number]. Topic: Clinical - Red Word Triage >> Sep 15, 2023  8:17 AM Kita Perish H wrote: [facility] that prompted transfer to Nurse Triage: Severe headaches, vomiting, just started taking escitalopram (LEXAPRO) 10 MG tablet yesterday and symptoms started yesterday and all night Reason for Disposition  [1] MODERATE headache (e.g., interferes with normal activities) AND [2] present > 24 hours AND [3] unexplained  (Exceptions: analgesics not tried, typical migraine, or headache part of viral illness)  Answer Assessment - Initial Assessment Questions 1. LOCATION: "Where does it hurt?"      Back of the head 2. ONSET: "When did the headache start?" (Minutes, hours or days)      3-4 weeks 3. PATTERN: "Does the pain come and go, or has it been constant since it started?"     intermittent 4. SEVERITY: "How bad is the pain?" and "What does it keep you from doing?"  (e.g., Scale 1-10; mild, moderate, or severe)   - MILD (1-3): doesn't interfere with normal activities    - MODERATE (4-7): interferes with normal activities or awakens from sleep    - SEVERE (8-10): excruciating pain, unable to do any normal activities         severe 5. RECURRENT SYMPTOM: "Have you ever had headaches before?" If Yes, ask: "When was the last time?" and "What happened that time?"      denies 6. CAUSE: "What do you think is causing the headache?"     unsure 7. MIGRAINE: "Have you been diagnosed with migraine headaches?" If Yes, ask: "Is this headache similar?"      denies 8. HEAD INJURY: "Has there been any recent injury to the head?"      denies 9. OTHER SYMPTOMS: "Do you have any other symptoms?" (fever, stiff neck, eye pain, sore throat, cold symptoms)     denies  Protocols used: Headache-A-AH

## 2023-09-16 DIAGNOSIS — H04123 Dry eye syndrome of bilateral lacrimal glands: Secondary | ICD-10-CM | POA: Diagnosis not present

## 2023-09-16 DIAGNOSIS — H25812 Combined forms of age-related cataract, left eye: Secondary | ICD-10-CM | POA: Diagnosis not present

## 2023-09-16 DIAGNOSIS — E119 Type 2 diabetes mellitus without complications: Secondary | ICD-10-CM | POA: Diagnosis not present

## 2023-09-16 LAB — HM DIABETES EYE EXAM

## 2023-09-19 ENCOUNTER — Other Ambulatory Visit

## 2023-09-21 ENCOUNTER — Ambulatory Visit
Admission: RE | Admit: 2023-09-21 | Discharge: 2023-09-21 | Discharge status: Home / Self Care | Source: Ambulatory Visit | Attending: Internal Medicine | Admitting: Internal Medicine

## 2023-09-21 DIAGNOSIS — R9082 White matter disease, unspecified: Secondary | ICD-10-CM | POA: Diagnosis not present

## 2023-09-21 DIAGNOSIS — G3184 Mild cognitive impairment, so stated: Secondary | ICD-10-CM | POA: Diagnosis not present

## 2023-09-21 DIAGNOSIS — G319 Degenerative disease of nervous system, unspecified: Secondary | ICD-10-CM | POA: Diagnosis not present

## 2023-09-22 ENCOUNTER — Ambulatory Visit: Attending: Cardiology

## 2023-09-22 ENCOUNTER — Ambulatory Visit: Payer: Self-pay | Admitting: Cardiology

## 2023-09-22 DIAGNOSIS — I4891 Unspecified atrial fibrillation: Secondary | ICD-10-CM

## 2023-09-23 ENCOUNTER — Telehealth: Payer: Self-pay

## 2023-09-23 ENCOUNTER — Telehealth: Payer: Self-pay | Admitting: Internal Medicine

## 2023-09-23 NOTE — Telephone Encounter (Signed)
 Waiting for results by radiology.

## 2023-09-23 NOTE — Telephone Encounter (Signed)
 Copied from CRM [id number]. Topic: Clinical - Lab/Test Results >> Sep 23, 2023  9:55 AM Bambi Bonine D wrote: Reason for CRM: Pt's daughter Niveen is calling regarding the lab results for the MRI the pt received on 6/4. She stated that the results are not showing in MyChart and would like to know the status.

## 2023-09-23 NOTE — Telephone Encounter (Signed)
 Contacted pt and scheduled.

## 2023-09-23 NOTE — Telephone Encounter (Signed)
 Active order in chart- see below.   Casey Bryan - Your vitamin D  is low, start ergocalciferol  50,000 units 1 tablet every week for the next 3 months.  We are sending a prescription. - Your vitamin B12 is low, that may affect your memory and your energy.  Recommend a B12 shot every week for 1 month, then once monthly.  Please call the office and arrange for that

## 2023-09-23 NOTE — Telephone Encounter (Signed)
 Patient called to schedule B12 . No active order on file . Please advise

## 2023-09-27 NOTE — Telephone Encounter (Signed)
 Called patient advised of below they verbalized understanding.

## 2023-09-27 NOTE — Telephone Encounter (Signed)
-----   Message from Debria Fang sent at 09/22/2023 11:21 AM EDT ----- Please tell patient that their recent cardiac monitor showed predominantly normal sinus rhythm with average HR 67 BPM. There were occasional PVCs and PACs (extra beats, not dangerous). No atrial fibrillation noted. Continue current treatment plan and follow up as arranged   Thanks KJ

## 2023-09-30 NOTE — Telephone Encounter (Signed)
 Spoke w/ reading room- they will get MRI moved up.

## 2023-10-03 ENCOUNTER — Ambulatory Visit (INDEPENDENT_AMBULATORY_CARE_PROVIDER_SITE_OTHER)

## 2023-10-03 DIAGNOSIS — E538 Deficiency of other specified B group vitamins: Secondary | ICD-10-CM | POA: Diagnosis not present

## 2023-10-03 MED ORDER — CYANOCOBALAMIN 1000 MCG/ML IJ SOLN
1000.0000 ug | Freq: Once | INTRAMUSCULAR | Status: AC
  Administered 2023-10-03: 1000 ug via INTRAMUSCULAR

## 2023-10-03 NOTE — Progress Notes (Signed)
 Pt here for weekly B12 injection per original order dated: per Dr. Neomi Banks on 09/15/23 Recommend a B12 shot every week for 1 month, then once monthly   Last B12 injection: 09/15/23  Last B12 level: 09/13/23   B12 1000mcg given IM, left deltoid and pt tolerated injection well.  Next B12 injection scheduled for: 10/10/23

## 2023-10-03 NOTE — Telephone Encounter (Signed)
Results back

## 2023-10-10 ENCOUNTER — Ambulatory Visit

## 2023-10-19 ENCOUNTER — Ambulatory Visit

## 2023-10-19 DIAGNOSIS — E538 Deficiency of other specified B group vitamins: Secondary | ICD-10-CM | POA: Diagnosis not present

## 2023-10-19 MED ORDER — CYANOCOBALAMIN 1000 MCG/ML IJ SOLN
1000.0000 ug | Freq: Once | INTRAMUSCULAR | Status: AC
  Administered 2023-10-19: 1000 ug via INTRAMUSCULAR

## 2023-10-19 NOTE — Progress Notes (Signed)
 Pt here for monthly B12 injection per Dr. Amon  Last B12 injection: 10/03/23  Last B12 level:  09/13/23  B12 1000mcg given IM, and pt tolerated injection well.  Pt has a CPE with Paz next week.

## 2023-10-26 ENCOUNTER — Ambulatory Visit (INDEPENDENT_AMBULATORY_CARE_PROVIDER_SITE_OTHER): Admitting: Internal Medicine

## 2023-10-26 ENCOUNTER — Encounter: Payer: Self-pay | Admitting: Internal Medicine

## 2023-10-26 VITALS — BP 138/88 | HR 58 | Temp 98.0°F | Resp 16 | Ht 63.0 in | Wt 178.1 lb

## 2023-10-26 DIAGNOSIS — F4321 Adjustment disorder with depressed mood: Secondary | ICD-10-CM | POA: Diagnosis not present

## 2023-10-26 DIAGNOSIS — Z794 Long term (current) use of insulin: Secondary | ICD-10-CM

## 2023-10-26 DIAGNOSIS — E119 Type 2 diabetes mellitus without complications: Secondary | ICD-10-CM

## 2023-10-26 DIAGNOSIS — I10 Essential (primary) hypertension: Secondary | ICD-10-CM

## 2023-10-26 DIAGNOSIS — E538 Deficiency of other specified B group vitamins: Secondary | ICD-10-CM

## 2023-10-26 MED ORDER — CYANOCOBALAMIN 1000 MCG/ML IJ SOLN
1000.0000 ug | Freq: Once | INTRAMUSCULAR | Status: AC
  Administered 2023-10-26: 1000 ug via INTRAMUSCULAR

## 2023-10-26 NOTE — Progress Notes (Unsigned)
 Subjective:    Patient ID: Casey Bryan, female    DOB: 04-Feb-1946, 78 y.o.   MRN: [id number]  DOS:  10/26/2023 Type of visit - description: Follow-up, here with her daughter.  Since the last visit he is feeling better. Depression: Tried Lexapro , had a number of side effects including diarrhea nausea and headache.  Self stopped.  Feeling better. There was a question of MCI at the last visit, that has improved. B12 deficiency was diagnosed on injections. Had a single episode when she felt tired and sleepy, she was at the beach, the daughter felt she was dehydrated, after taking fluids she feels better the next day.   Review of Systems See above   Past Medical History:  Diagnosis Date   Amaurosis fugax 12/12/2015   Diabetes mellitus    GERD (gastroesophageal reflux disease)    Glaucoma 04-2012   Hyperlipemia    Hypertension     Past Surgical History:  Procedure Laterality Date   NO PAST SURGERIES      Current Outpatient Medications  Medication Instructions   aspirin  EC 81 mg, Daily   atorvastatin  (LIPITOR) 80 mg, Oral, Daily at bedtime   Calcium  Carbonate-Vit D-Min (CALCIUM  1200 PO) 1 capsule, Daily   escitalopram  (LEXAPRO ) 10 mg, Oral, Daily   esomeprazole  (NEXIUM ) 40 mg, Oral, 2 times daily before meals   Ferrous Sulfate ([employer] IRON PO) Take by mouth.   metFORMIN  (GLUCOPHAGE ) 1,000 mg, Oral, 2 times daily with meals   metoprolol  tartrate (LOPRESSOR ) 100 mg, Oral, 2 times daily   Multiple Vitamin (MULTIVITAMIN) tablet 1 tablet, Daily   Multiple Vitamins-Minerals (ICAPS AREDS 2 PO) 1 capsule, 2 times daily   Probiotic Product (PROBIOTIC ADVANCED PO) 1 tablet, Daily   Vitamin D  (Ergocalciferol ) (DRISDOL ) 50,000 Units, Oral, Every 7 days       Objective:   Physical Exam BP 138/88   Pulse (!) 58   Temp 98 F (36.7 C) (Oral)   Resp 16   Ht 5' 3 (1.6 m)   Wt 178 lb 2 oz (80.8 kg)   SpO2 97%   BMI 31.55 kg/m  General:   Well developed, NAD, BMI noted. HEENT:   Normocephalic . Face symmetric, atraumatic   Lower extremities: no pretibial edema bilaterally  Skin: Not pale. Not jaundice Neurologic:  alert & oriented X3.  Speech normal, gait appropriate for age and unassisted Psych--  Cognition and judgment appear intact.  Cooperative with normal attention span and concentration.  Behavior appropriate. No anxious or depressed appearing.      Assessment   Assessment: DM HTN (intolerant amlodipine  11-2015 and 2018) Hyperlipidemia Glaucoma dx 2014, cataracts NEURO: L amaurosix fugax 03-2015, CVA -- saw opthalmology, started ASA  (02-2015); echo 04-09-15: nl EF, grade 2 Diast disfx -- saw neuro: Probable cryptogenic  --Headaches: Sees  neurology   CV: + FH CV Dz Carotid artery disease:    US  04-2015: Progression of the right ICA now 40-59%. Stable left ICA 1-39%. US  02/2018, 07-30-2020 : carotids essentially normal GI: -GERD -Anemia: EGD colonoscopy 2017, Dr. Celestia,  Henreitta polyp removed   PLAN: DM: Last A1c 5.7.  On metformin  only, CBGs in the low 100s.  No change. MCI?  Depression?: See last visit, the patient was grieving her husband.  Memory was very poor. She tried Lexapro , had side effects, self stopped it. She sees a Veterinary surgeon and at this point emotionally is doing better. Memory issues have completely resolved, the daughter thinks she is related to B12  shot. MRI showed chronic changes and that was extensively discussed with the patient and daughter Grieving: See above, continue counseling     Vitamin deficiencies: B12 and vitamin D  were low, was Rx ergocalciferol , now on B12 shots monthly.  RTC 4 months

## 2023-10-26 NOTE — Patient Instructions (Signed)
 Continue with the B12 shots monthly, you can make an appointment at the front desk.  Next visit in 3 to 4 months, please make an appointment.

## 2023-10-27 NOTE — Assessment & Plan Note (Signed)
 DM: Last A1c 5.7.  On metformin  only, CBGs in the low 100s.  No change. MCI?  Depression?: See last visit, the patient was grieving her husband.  Memory was very poor. She tried Lexapro , had side effects, self stopped it. She sees a Veterinary surgeon and at this point emotionally is doing better. Memory issues have completely resolved, the daughter thinks she is related to B12 shot. MRI showed chronic changes and that was extensively discussed with the patient and daughter Grieving: See above, continue counseling Vitamin deficiencies: B12 and vitamin D  were low, was Rx ergocalciferol , now on B12 shots monthly.   RTC 4 months

## 2023-11-14 ENCOUNTER — Ambulatory Visit: Admitting: Internal Medicine

## 2023-11-29 ENCOUNTER — Ambulatory Visit (INDEPENDENT_AMBULATORY_CARE_PROVIDER_SITE_OTHER)

## 2023-11-29 DIAGNOSIS — E538 Deficiency of other specified B group vitamins: Secondary | ICD-10-CM

## 2023-11-29 MED ORDER — CYANOCOBALAMIN 1000 MCG/ML IJ SOLN
1000.0000 ug | Freq: Once | INTRAMUSCULAR | Status: AC
  Administered 2023-11-29 (×2): 1000 ug via INTRAMUSCULAR

## 2023-11-29 NOTE — Progress Notes (Signed)
 Pt here for monthly B12 injection per Paz, Jose E, MD   B12 1000mcg given left deltoid IM and pt tolerated injection well.  Next B12 injection scheduled for / says she will call and make her at

## 2023-12-12 ENCOUNTER — Ambulatory Visit: Admitting: Cardiology

## 2023-12-20 ENCOUNTER — Ambulatory Visit

## 2023-12-27 ENCOUNTER — Other Ambulatory Visit: Payer: Self-pay | Admitting: Internal Medicine

## 2024-01-06 ENCOUNTER — Ambulatory Visit (INDEPENDENT_AMBULATORY_CARE_PROVIDER_SITE_OTHER): Admitting: Neurology

## 2024-01-06 DIAGNOSIS — E538 Deficiency of other specified B group vitamins: Secondary | ICD-10-CM

## 2024-01-06 MED ORDER — CYANOCOBALAMIN 1000 MCG/ML IJ SOLN
1000.0000 ug | Freq: Once | INTRAMUSCULAR | Status: AC
  Administered 2024-01-06: 1000 ug via INTRAMUSCULAR

## 2024-01-06 NOTE — Progress Notes (Signed)
 Patient is here for a vitamin B12 injection per orders from Dr. Amon:  09/13/2023: Your vitamin B12 is low, that may affect your memory and your energy. Recommend a B12 shot every week for 1 month, then once monthly. Please call the office and arrange for that.   Last injection: 11/29/2023. Denies gastrointestinal problems or dizziness.  B12 injection to left deltoid with no apparent complications.  Scheduled next injection in one month: 02/07/2024.

## 2024-01-30 NOTE — Progress Notes (Signed)
 Cardiology Office Note   Date:  02/09/2024  ID:  Casey Bryan, DOB 20-Sep-1945, MRN [id number] PCP: Amon Aloysius FORBES, MD  [facility] Providers Cardiologist:  Wilbert Bihari, MD   History of Present Illness Casey Bryan is a 78 y.o. female with past medical history of PAF, hypertension, hyperlipidemia, type 2 diabetes, amaurosis fugax, carotid artery stenosis, glaucoma, GERD.  Patient followed by Dr. Bihari and presents today for 28-month follow-up appointment.  Patient had presented to the ED on 08/15/23 in the setting of severe electrolyte abnormalities, UTI, pneumonia.  EKG in the ED showed sinus rhythm with PACs.  Chest x-ray consistent with increased pulmonary vascular congestion.  CT abdomen showed lower lobe pneumonia.  Admitted and treated with antibiotics and IV fluids.  While admitted, patient developed new onset atrial fibrillation with RVR and cardiology was consulted.  She converted to normal sinus rhythm during that admission.  TSH normal.  Echocardiogram 08/16/2023 showed EF 55-60%, no regional wall motion abnormalities, normal LV diastolic parameters, normal RV systolic function, mild MR.  Anticoagulation was deferred due to single episode of atrial fibrillation that occurred in the setting of metabolic abnormalities.  Outpatient 30-day monitor showed predominantly normal sinus rhythm with average heart rate 67 bpm, occasional PACs and PVCs.  No atrial fibrillation recurrence.  Patient was seen as an outpatient on 09/06/2023.  Her BP was well-controlled. Requested transfer from Dr. Bihari to Dr. Delford, as her husband followed with Dr. Delford   Carotid Ultrasounds 5/29 showed mild atherosclerotic changes in the right internal carotid artery with stenosis measuring less than 50% stenosis, mild atherosclerotic changes in the left internal carotid artery with stenosis measuring less than 50%.  Today, patient presents for a follow-up appointment.  Reports she is continue to do well from a  cardiac perspective.  Denies chest pain, dizziness, shortness of breath.  Denies palpitations, syncope, near syncope.  She has had chronic lower extremity swelling for 30 years or so.  Tells me that this has been stable.  She is having a lot of pain in her left toes.  This started a day or so ago.  Her family is concerned that she might have gout.  Her blood pressure is elevated today.  She has not been checking her blood pressure at home.  Tells me that when she had been admitted in April 2025, multiple blood pressure medications were stopped due to electrolyte abnormalities.  She is currently only on metoprolol  for blood pressure.  She is compliant with her aspirin  and Lipitor.  She wears an Scientist, physiological and has not had any alerts for fast heart rate/afib   Studies Reviewed Cardiac Studies & Procedures   ______________________________________________________________________________________________     ECHOCARDIOGRAM  ECHOCARDIOGRAM COMPLETE 08/16/2023  Narrative ECHOCARDIOGRAM REPORT    Patient Name:   KAYELYN LEMON Date of Exam: 08/16/2023 Medical Rec #:  [id number]     Height:       64.0 in Accession #:    [id number]    Weight:       167.8 lb Date of Birth:  04/23/1945      BSA:          1.816 m Patient Age:    77 years      BP:           174/51 mmHg Patient Gender: F             HR:           85 bpm. Exam  Location:  Inpatient  Procedure: 2D Echo, Cardiac Doppler and Color Doppler (Both Spectral and Color Flow Doppler were utilized during procedure).  Indications:    I48.91* Unspeicified atrial fibrillation  History:        Patient has prior history of Echocardiogram examinations, most recent 04/09/2015. Arrythmias:Atrial Fibrillation; Risk Factors:Hypertension and Diabetes.  Sonographer:    Eva Lash Referring Phys: [id number] [facility] GOEL  IMPRESSIONS   1. Left ventricular ejection fraction, by estimation, is 55 to 60%. The left ventricle has normal function. The left ventricle  has no regional wall motion abnormalities. Left ventricular diastolic parameters were normal. 2. Right ventricular systolic function is normal. The right ventricular size is normal. There is normal pulmonary artery systolic pressure. The estimated right ventricular systolic pressure is 31.7 mmHg. 3. Left atrial size was moderately dilated. 4. The mitral valve is normal in structure. Mild mitral valve regurgitation. 5. The aortic valve is tricuspid. There is mild calcification of the aortic valve. Aortic valve regurgitation is not visualized. Aortic valve sclerosis/calcification is present, without any evidence of aortic stenosis. 6. The inferior vena cava is normal in size with greater than 50% respiratory variability, suggesting right atrial pressure of 3 mmHg.  Comparison(s): Prior images unable to be directly viewed, comparison made by report only.  FINDINGS Left Ventricle: Left ventricular ejection fraction, by estimation, is 55 to 60%. The left ventricle has normal function. The left ventricle has no regional wall motion abnormalities. The left ventricular internal cavity size was normal in size. There is no left ventricular hypertrophy. Left ventricular diastolic parameters were normal.  Right Ventricle: The right ventricular size is normal. No increase in right ventricular wall thickness. Right ventricular systolic function is normal. There is normal pulmonary artery systolic pressure. The tricuspid regurgitant velocity is 2.68 m/s, and with an assumed right atrial pressure of 3 mmHg, the estimated right ventricular systolic pressure is 31.7 mmHg.  Left Atrium: Left atrial size was moderately dilated.  Right Atrium: Right atrial size was normal in size.  Pericardium: There is no evidence of pericardial effusion.  Mitral Valve: The mitral valve is normal in structure. Mild mitral valve regurgitation, with centrally-directed jet.  Tricuspid Valve: The tricuspid valve is normal in  structure. Tricuspid valve regurgitation is trivial.  Aortic Valve: The aortic valve is tricuspid. There is mild calcification of the aortic valve. Aortic valve regurgitation is not visualized. Aortic valve sclerosis/calcification is present, without any evidence of aortic stenosis. Aortic valve mean gradient measures 11.0 mmHg. Aortic valve peak gradient measures 19.2 mmHg. Aortic valve area, by VTI measures 1.55 cm.  Pulmonic Valve: The pulmonic valve was grossly normal. Pulmonic valve regurgitation is not visualized. No evidence of pulmonic stenosis.  Aorta: The aortic root is normal in size and structure.  Venous: The inferior vena cava is normal in size with greater than 50% respiratory variability, suggesting right atrial pressure of 3 mmHg.  IAS/Shunts: No atrial level shunt detected by color flow Doppler.   LEFT VENTRICLE PLAX 2D LVIDd:         4.60 cm      Diastology LVIDs:         3.50 cm      LV e' medial:    10.70 cm/s LV PW:         1.10 cm      LV E/e' medial:  10.9 LV IVS:        1.10 cm      LV e' lateral:   10.30 cm/s LVOT  diam:     1.90 cm      LV E/e' lateral: 11.3 LV SV:         58 LV SV Index:   32 LVOT Area:     2.84 cm  LV Volumes (MOD) LV vol d, MOD A2C: 100.0 ml LV vol d, MOD A4C: 110.0 ml LV vol s, MOD A2C: 39.1 ml LV vol s, MOD A4C: 37.7 ml LV SV MOD A2C:     60.9 ml LV SV MOD A4C:     110.0 ml LV SV MOD BP:      66.7 ml  RIGHT VENTRICLE RV S prime:     15.50 cm/s TAPSE (M-mode): 2.3 cm  LEFT ATRIUM             Index LA diam:        3.90 cm 2.15 cm/m LA Vol (A2C):   85.7 ml 47.20 ml/m LA Vol (A4C):   68.2 ml 37.56 ml/m LA Biplane Vol: 79.8 ml 43.95 ml/m AORTIC VALVE AV Area (Vmax):    1.49 cm AV Area (Vmean):   1.37 cm AV Area (VTI):     1.55 cm AV Vmax:           219.00 cm/s AV Vmean:          152.000 cm/s AV VTI:            0.376 m AV Peak Grad:      19.2 mmHg AV Mean Grad:      11.0 mmHg LVOT Vmax:         115.00 cm/s LVOT  Vmean:        73.200 cm/s LVOT VTI:          0.205 m LVOT/AV VTI ratio: 0.55  AORTA Ao Asc diam: 3.30 cm  MITRAL VALVE                TRICUSPID VALVE MV Area (PHT): 5.36 cm     TR Peak grad:   28.7 mmHg MV Decel Time: 142 msec     TR Vmax:        268.00 cm/s MV E velocity: 116.50 cm/s MV A velocity: 72.20 cm/s   SHUNTS MV E/A ratio:  1.61         Systemic VTI:  0.20 m Systemic Diam: 1.90 cm  Mihai Croitoru MD Electronically signed by Jerel Balding MD Signature Date/Time: 08/16/2023/4:41:50 PM    Final    MONITORS  CARDIAC EVENT MONITOR 09/22/2023  Narrative   Predominant rhythm was normal sinus rhythm with an average heart rate of 67 bpm.  The heart rate ranged from 51 to 119 bpm   Occasional PACs and PVCs   Nonsustained atrial tachycardia       ______________________________________________________________________________________________      Risk Assessment/Calculations  CHA2DS2-VASc Score = 5  This indicates a 7.2% annual risk of stroke. The patient's score is based upon: CHF History: 0 HTN History: 1 Diabetes History: 1 Stroke History: 0 Vascular Disease History: 0 Age Score: 2 Gender Score: 1  HYPERTENSION CONTROL Vitals:   02/09/24 1012 02/09/24 1040  BP: (!) 181/77 (!) 160/84    The patient's blood pressure is elevated above target today.  In order to address the patient's elevated BP: A new medication was prescribed today.          Physical Exam VS:  BP (!) 160/84   Pulse 61   Ht 5' 4 (1.626 m)   Wt 182 lb 12.8 oz (  82.9 kg)   SpO2 98%   BMI 31.38 kg/m        Wt Readings from Last 3 Encounters:  02/09/24 182 lb 12.8 oz (82.9 kg)  10/26/23 178 lb 2 oz (80.8 kg)  09/15/23 175 lb (79.4 kg)    GEN: Well nourished, well developed in no acute distress.  Sitting comfortably on the exam table NECK: No JVD CARDIAC: RRR.  Faint systolic murmur at right upper sternal border RESPIRATORY:  Clear to auscultation without rales, wheezing or  rhonchi.  Normal gripping on room air ABDOMEN: Soft, non-tender, non-distended EXTREMITIES: Trace edema in bilateral lower extremities; No deformity.  Left toes tender to palpation  ASSESSMENT AND PLAN PAF  - Patient admitted in 07/2023 with pneumonia, profound electrolyte abnormalities.  While admitted, had an episode of atrial fibrillation.  Was not started on anticoagulation as patient only had single episode of atrial fibrillation and it occurred in the setting of electrolyte abnormalities, illness.  TSH normal - Echocardiogram 08/16/23 showed EF 55-60%, no regional wall motion abnormalities, normal RV systolic function, mild MR -30-day monitor postdischarge showed no recurrence of atrial fibrillation -Patient denies palpitations or episodes of tachycardia.  Wears an Apple watch and has not received any alerts.  Heart rate regular on exam today - Electrolytes have since normalized-on 5/27, K4.0, Na 137  - Continue metoprolol  to tartrate 100 mg twice daily - Okay to remain off anticoagulation as A-fib has not recurred  HTN - BP elevated today in clinic.  She has not been checking her blood pressure at home.  Tells me that in April 2025, her Avapro  and chlorthalidone  were stopped due to electrolyte abnormalities. -We discussed different options for BP management.  Patient concerned about medications that could affect her electrolytes.  Discussed starting amlodipine  5 mg daily.  Discussed that this may cause worsening lower extremity swelling and to let us  know if this occurs.  Patient in agreement. - Start amlodipine  5 mg daily - Continue metoprolol  tartrate 100 mg BID   HLD  - Lipid panel from 06/2023 showed LDL 61, HDL 53, triglycerides 73, total cholesterol 129 - continue lipitor 80 mg daily   Carotid Artery Disease  - Most recent ultrasounds from 09/15/23 showed less than 50% stenosis bilaterally  - Continue ASA 81 mg daily and lipitor 80 mg daily   Mild MR - Echocardiogram 07/2023  showed mild MR, mild calcification of the aortic valve without stenosis - Anticipate repeat echo in 3-5 years or as clinically indicated  Dispo: Follow-up with me or Damien Braver in 6 weeks for BP check.  Signed, Rollo FABIENE Louder, PA-C

## 2024-02-07 ENCOUNTER — Ambulatory Visit (INDEPENDENT_AMBULATORY_CARE_PROVIDER_SITE_OTHER): Admitting: *Deleted

## 2024-02-07 DIAGNOSIS — E538 Deficiency of other specified B group vitamins: Secondary | ICD-10-CM

## 2024-02-07 MED ORDER — CYANOCOBALAMIN 1000 MCG/ML IJ SOLN
1000.0000 ug | Freq: Once | INTRAMUSCULAR | Status: AC
  Administered 2024-02-07: 1000 ug via INTRAMUSCULAR

## 2024-02-07 NOTE — Progress Notes (Signed)
 Pt here for monthly b12 injection per Dr. Amon.  Given LD without complications.  Next injection scheduled for 03/09/24.

## 2024-02-09 ENCOUNTER — Ambulatory Visit: Attending: Internal Medicine | Admitting: Cardiology

## 2024-02-09 ENCOUNTER — Encounter: Payer: Self-pay | Admitting: Cardiology

## 2024-02-09 VITALS — BP 160/84 | HR 61 | Ht 64.0 in | Wt 182.8 lb

## 2024-02-09 DIAGNOSIS — I480 Paroxysmal atrial fibrillation: Secondary | ICD-10-CM | POA: Diagnosis not present

## 2024-02-09 DIAGNOSIS — I340 Nonrheumatic mitral (valve) insufficiency: Secondary | ICD-10-CM | POA: Diagnosis not present

## 2024-02-09 DIAGNOSIS — I10 Essential (primary) hypertension: Secondary | ICD-10-CM | POA: Insufficient documentation

## 2024-02-09 DIAGNOSIS — I6523 Occlusion and stenosis of bilateral carotid arteries: Secondary | ICD-10-CM | POA: Insufficient documentation

## 2024-02-09 DIAGNOSIS — E785 Hyperlipidemia, unspecified: Secondary | ICD-10-CM | POA: Diagnosis not present

## 2024-02-09 MED ORDER — AMLODIPINE BESYLATE 5 MG PO TABS
5.0000 mg | ORAL_TABLET | Freq: Every day | ORAL | 3 refills | Status: AC
Start: 2024-02-09 — End: 2024-05-09

## 2024-02-09 NOTE — Patient Instructions (Addendum)
 Medication Instructions:    START TAKING :   AMLODIPINE   5 MG ONCE  A DAY     KEEP A BLOOD PRESSURE LOG AND  MAKE SURE TO TAKE BLOOD PRESSURE  30-40 MINUTES AFTER  MEDICATION AND WRITE ON LOG   AND BRING TO FOLLOW UP VISIT    *If you need a refill on your cardiac medications before your next appointment, please call your pharmacy*   Lab Work: NONE ORDERED  TODAY    If you have labs (blood work) drawn today and your tests are completely normal, you will receive your results only by: MyChart Message (if you have MyChart) OR A paper copy in the mail If you have any lab test that is abnormal or we need to change your treatment, we will call you to review the results.   Testing/Procedures: NONE ORDERED  TODAY    Follow-Up: At [facility], you and your health needs are our priority.  As part of our continuing mission to provide you with exceptional heart care, our providers are all part of one team.  This team includes your primary Cardiologist (physician) and Advanced Practice Providers or APPs (Physician Assistants and Nurse Practitioners) who all work together to provide you with the care you need, when you need it.  Your next appointment:   6 week(s)  WITH Lamarr Satterfield, NP or Damien Braver, NP         We recommend signing up for the patient portal called MyChart.  Sign up information is provided on this After Visit Summary.  MyChart is used to connect with patients for Virtual Visits (Telemedicine).  Patients are able to view lab/test results, encounter notes, upcoming appointments, etc.  Non-urgent messages can be sent to your provider as well.   To learn more about what you can do with MyChart, go to [email or web address].   Other Instructions

## 2024-02-27 ENCOUNTER — Ambulatory Visit: Admitting: Internal Medicine

## 2024-02-28 ENCOUNTER — Other Ambulatory Visit: Payer: Self-pay | Admitting: Internal Medicine

## 2024-03-08 ENCOUNTER — Ambulatory Visit

## 2024-03-08 VITALS — BP 138/64 | HR 60 | Temp 97.8°F | Ht 62.0 in | Wt 179.8 lb

## 2024-03-08 DIAGNOSIS — E538 Deficiency of other specified B group vitamins: Secondary | ICD-10-CM

## 2024-03-08 DIAGNOSIS — Z0000 Encounter for general adult medical examination without abnormal findings: Secondary | ICD-10-CM

## 2024-03-08 DIAGNOSIS — Z23 Encounter for immunization: Secondary | ICD-10-CM

## 2024-03-08 MED ORDER — CYANOCOBALAMIN 1000 MCG/ML IJ SOLN
1000.0000 ug | Freq: Once | INTRAMUSCULAR | Status: AC
  Administered 2024-03-08: 1000 ug via INTRAMUSCULAR

## 2024-03-08 NOTE — Progress Notes (Addendum)
 Chief Complaint  Patient presents with   Medicare Wellness     Subjective:   Casey Bryan is a 78 y.o. female who presents for a Medicare Annual Wellness Visit.  Allergies (verified) Prevnar [pneumococcal 13-val conj vacc], Clarithromycin  [clarithromycin ], and Tetanus-diphtheria toxoids td   History: Past Medical History:  Diagnosis Date   Amaurosis fugax 12/12/2015   Diabetes mellitus    GERD (gastroesophageal reflux disease)    Glaucoma 04-2012   Hyperlipemia    Hypertension    Past Surgical History:  Procedure Laterality Date   NO PAST SURGERIES     Family History  Problem Relation Age of Onset   Diabetes Mother        uncles, mother , father    Coronary artery disease Other        F age 78, B age 53, uncle age 77 : MI   Heart attack Brother 48   Breast cancer Sister    Multiple myeloma Sister    Colon cancer Neg Hx    Social History   Occupational History   Occupation: semi-retired,own a busines, airline pilot  Tobacco Use   Smoking status: Never   Smokeless tobacco: Never  Substance and Sexual Activity   Alcohol use: Yes    Comment: socially   Drug use: No   Sexual activity: Not on file   Tobacco Counseling Counseling given: No  SDOH Screenings   Food Insecurity: No Food Insecurity (03/08/2024)  Housing: Low Risk  (03/08/2024)  Recent Concern: Housing - High Risk (03/07/2024)  Transportation Needs: No Transportation Needs (03/08/2024)  Utilities: Not At Risk (03/08/2024)  Alcohol Screen: Low Risk  (03/07/2024)  Depression (PHQ2-9): Low Risk  (03/08/2024)  Financial Resource Strain: Low Risk  (03/07/2024)  Physical Activity: Insufficiently Active (03/08/2024)  Social Connections: Moderately Integrated (03/08/2024)  Stress: No Stress Concern Present (03/08/2024)  Tobacco Use: Low Risk  (03/08/2024)  Health Literacy: Adequate Health Literacy (03/08/2024)   See flowsheets for full screening details  Depression Screen PHQ 2 & 9 Depression Scale-  Over the past 2 weeks, how often have you been bothered by any of the following problems? Little interest or pleasure in doing things: 0 Feeling down, depressed, or hopeless (PHQ Adolescent also includes...irritable): 0 PHQ-2 Total Score: 0 Trouble falling or staying asleep, or sleeping too much: 2 Feeling tired or having little energy: 3 Poor appetite or overeating (PHQ Adolescent also includes...weight loss): 3 Feeling bad about yourself - or that you are a failure or have let yourself or your family down: 0 Trouble concentrating on things, such as reading the newspaper or watching television (PHQ Adolescent also includes...like school work): 1 Moving or speaking so slowly that other people could have noticed. Or the opposite - being so fidgety or restless that you have been moving around a lot more than usual: 2 Thoughts that you would be better off dead, or of hurting yourself in some way: 0 PHQ-9 Total Score: 14     Goals Addressed             This Visit's Progress    Increase physical activity       Get more active.       Visit info / Clinical Intake: Medicare Wellness Visit Type:: Subsequent Annual Wellness Visit Persons participating in visit:: patient Medicare Wellness Visit Mode:: In-person (required for WTM) Information given by:: patient Interpreter Needed?: No Pre-visit prep was completed: yes AWV questionnaire completed by patient prior to visit?: yes Date:: 03/07/24 Living arrangements:: ROLL[place])  lives alone Patient's Overall Health Status Rating: very good Typical amount of pain: none Does pain affect daily life?: no Are you currently prescribed opioids?: no  Dietary Habits and Nutritional Risks How many meals a day?: 2 Eats fruit and vegetables daily?: yes Most meals are obtained by: preparing own meals In the last 2 weeks, have you had any of the following?: none Diabetic:: (!) yes Any non-healing wounds?: no How often do you check your BS?: as  needed Would you like to be referred to a Nutritionist or for Diabetic Management? : no  Functional Status Activities of Daily Living (to include ambulation/medication): Independent Ambulation: Independent with device- listed below Home Assistive Devices/Equipment: Eyeglasses Medication Administration: Independent Home Management: Independent Manage your own finances?: yes Primary transportation is: driving Concerns about vision?: no *vision screening is required for WTM* Concerns about hearing?: no  Fall Screening Falls in the past year?: 0 Number of falls in past year: 0 Was there an injury with Fall?: 0 Fall Risk Category Calculator: 0 Patient Fall Risk Level: Low Fall Risk  Fall Risk Patient at Risk for Falls Due to: No Fall Risks Fall risk Follow up: Falls evaluation completed; Education provided  Home and Transportation Safety: All rugs have non-skid backing?: N/A, no rugs All stairs or steps have railings?: yes Grab bars in the bathtub or shower?: yes Have non-skid surface in bathtub or shower?: yes Good home lighting?: yes Regular seat belt use?: yes Hospital stays in the last year:: (!) yes How many hospital stays:: 4 Reason: Low sodium  Cognitive Assessment Difficulty concentrating, remembering, or making decisions? : no Will 6CIT or Mini Cog be Completed: no 6CIT or Mini Cog Declined: patient alert, oriented, able to answer questions appropriately and recall recent events  Advance Directives (For Healthcare) Does Patient Have a Medical Advance Directive?: Yes Does patient want to make changes to medical advance directive?: No - Patient declined Type of Advance Directive: Healthcare Power of [place]; Living will Copy of Healthcare Power of Attorney in Chart?: No - copy requested Copy of Living Will in Chart?: No - copy requested  Reviewed/Updated  Reviewed/Updated: Reviewed All (Medical, Surgical, Family, Medications, Allergies, Care Teams, Patient  Goals)        Objective:    Today's Vitals   03/08/24 1240  BP: 138/64  Pulse: 60  Temp: 97.8 F (36.6 C)  TempSrc: Oral  SpO2: 96%  Weight: 179 lb 12.8 oz (81.6 kg)  Height: 5' 2 (1.575 m)   Body mass index is 32.89 kg/m.  Current Medications (verified) Outpatient Encounter Medications as of 03/08/2024  Medication Sig   amLODipine  (NORVASC ) 5 MG tablet Take 1 tablet (5 mg total) by mouth daily.   aspirin  EC 81 MG tablet Take 81 mg by mouth daily.   atorvastatin  (LIPITOR) 80 MG tablet Take 1 tablet (80 mg total) by mouth at bedtime.   Calcium  Carbonate-Vit D-Min (CALCIUM  1200 PO) Take 1 capsule by mouth daily at 12 noon.   esomeprazole  (NEXIUM ) 40 MG capsule Take 1 capsule (40 mg total) by mouth 2 (two) times daily before a meal.   Ferrous Sulfate ([employer] IRON PO) Take by mouth.   metFORMIN  (GLUCOPHAGE ) 1000 MG tablet Take 1 tablet (1,000 mg total) by mouth 2 (two) times daily with a meal.   metoprolol  tartrate (LOPRESSOR ) 50 MG tablet TAKE 2 TABLETS(100 MG) BY MOUTH TWICE DAILY   Multiple Vitamin (MULTIVITAMIN) tablet Take 1 tablet by mouth daily.   Multiple Vitamins-Minerals (ICAPS AREDS 2 PO) Take  1 capsule by mouth in the morning and at bedtime.   Probiotic Product (PROBIOTIC ADVANCED PO) Take 1 tablet by mouth daily.   No facility-administered encounter medications on file as of 03/08/2024.   Hearing/Vision screen Hearing Screening - Comments:: Denies hearing difficulties   Vision Screening - Comments:: Wears rx glasses - up to date with routine eye exams with  [facility] Immunizations and Health Maintenance Health Maintenance  Topic Date Due   Diabetic kidney evaluation - Urine ACR  10/15/2010   HEMOGLOBIN A1C  03/15/2024   FOOT EXAM  06/23/2024   Diabetic kidney evaluation - eGFR measurement  09/12/2024   OPHTHALMOLOGY EXAM  09/15/2024   Medicare Annual Wellness (AWV)  03/08/2025   Pneumococcal Vaccine: 50+ Years  Completed   Influenza Vaccine  Completed    Bone Density Scan  Completed   Hepatitis C Screening  Completed   Meningococcal B Vaccine  Aged Out   DTaP/Tdap/Td  Discontinued   Mammogram  Discontinued   Colonoscopy  Discontinued   COVID-19 Vaccine  Discontinued   Zoster Vaccines- Shingrix  Discontinued        Assessment/Plan:  This is a routine wellness examination for Casey Bryan.  Patient Care Team: Amon Aloysius BRAVO, MD as PCP - General Delford Maude BROCKS, MD as PCP - Cardiology (Cardiology) Skeet Juliene SAUNDERS, DO as Consulting Physician (Neurology) Camillo Golas, MD as Consulting Physician (Ophthalmology) Celestia Agent, MD (Inactive) as Consulting Physician (Gastroenterology)  I have personally reviewed and noted the following in the patient's chart:   Medical and social history Use of alcohol, tobacco or illicit drugs  Current medications and supplements including opioid prescriptions. Functional ability and status Nutritional status Physical activity Advanced directives List of other physicians Hospitalizations, surgeries, and ER visits in previous 12 months Vitals Screenings to include cognitive, depression, and falls Referrals and appointments  Orders Placed This Encounter  Procedures   Flu vaccine HIGH DOSE PF(Fluzone Trivalent)   In addition, I have reviewed and discussed with patient certain preventive protocols, quality metrics, and best practice recommendations. A written personalized care plan for preventive services as well as general preventive health recommendations were provided to patient.   Rojelio LELON Blush, LPN   [id number]    Return in 1 year on 03/15/25  After Visit Summary: (In Person-Printed) AVS printed and given to the patient  Nurse Notes: None

## 2024-03-08 NOTE — Patient Instructions (Addendum)
 Casey Bryan,  Thank you for taking the time for your Medicare Wellness Visit. I appreciate your continued commitment to your health goals. Please review the care plan we discussed, and feel free to reach out if I can assist you further.  Please note that Annual Wellness Visits do not include a physical exam. Some assessments may be limited, especially if the visit was conducted virtually. If needed, we may recommend an in-person follow-up with your provider.  Ongoing Care Seeing your primary care provider every 3 to 6 months helps us  monitor your health and provide consistent, personalized care.   Referrals If a referral was made during today's visit and you haven't received any updates within two weeks, please contact the referred provider directly to check on the status.  Recommended Screenings:  Health Maintenance  Topic Date Due   Yearly kidney health urinalysis for diabetes  10/15/2010   Hemoglobin A1C  03/15/2024   Complete foot exam   06/23/2024   Yearly kidney function blood test for diabetes  09/12/2024   Eye exam for diabetics  09/15/2024   Medicare Annual Wellness Visit  03/08/2025   Pneumococcal Vaccine for age over 11  Completed   Flu Shot  Completed   Osteoporosis screening with Bone Density Scan  Completed   Hepatitis C Screening  Completed   Meningitis B Vaccine  Aged Out   DTaP/Tdap/Td vaccine  Discontinued   Breast Cancer Screening  Discontinued   Colon Cancer Screening  Discontinued   COVID-19 Vaccine  Discontinued   Zoster (Shingles) Vaccine  Discontinued       03/08/2024   12:49 PM  Advanced Directives  Does Patient Have a Medical Advance Directive? Yes  Type of Estate Agent of [place];Living will  Does patient want to make changes to medical advance directive? No - Patient declined  Copy of Healthcare Power of Attorney in Chart? No - copy requested    Vision: Annual vision screenings are recommended for early detection of  glaucoma, cataracts, and diabetic retinopathy. These exams can also reveal signs of chronic conditions such as diabetes and high blood pressure.  Dental: Annual dental screenings help detect early signs of oral cancer, gum disease, and other conditions linked to overall health, including heart disease and diabetes.  Please see the attached documents for additional preventive care recommendations.

## 2024-03-08 NOTE — Addendum Note (Signed)
 Addended by: KANDIS JEOFFREY CROME on: 03/08/2024 01:39 PM   Modules accepted: Orders

## 2024-03-09 ENCOUNTER — Ambulatory Visit

## 2024-03-14 ENCOUNTER — Other Ambulatory Visit: Payer: Self-pay | Admitting: Internal Medicine

## 2024-03-29 ENCOUNTER — Ambulatory Visit: Admitting: Nurse Practitioner

## 2024-04-16 ENCOUNTER — Ambulatory Visit: Admitting: Internal Medicine

## 2024-04-16 ENCOUNTER — Encounter: Payer: Self-pay | Admitting: Internal Medicine

## 2024-04-16 VITALS — BP 136/66 | HR 58 | Temp 97.9°F | Resp 16 | Ht 64.0 in | Wt 183.0 lb

## 2024-04-16 DIAGNOSIS — E538 Deficiency of other specified B group vitamins: Secondary | ICD-10-CM

## 2024-04-16 DIAGNOSIS — I10 Essential (primary) hypertension: Secondary | ICD-10-CM | POA: Diagnosis not present

## 2024-04-16 DIAGNOSIS — E785 Hyperlipidemia, unspecified: Secondary | ICD-10-CM

## 2024-04-16 DIAGNOSIS — E559 Vitamin D deficiency, unspecified: Secondary | ICD-10-CM

## 2024-04-16 DIAGNOSIS — Z794 Long term (current) use of insulin: Secondary | ICD-10-CM | POA: Diagnosis not present

## 2024-04-16 DIAGNOSIS — E119 Type 2 diabetes mellitus without complications: Secondary | ICD-10-CM | POA: Diagnosis not present

## 2024-04-16 MED ORDER — ESOMEPRAZOLE MAGNESIUM 40 MG PO CPDR: 40.0000 mg | DELAYED_RELEASE_CAPSULE | Freq: Two times a day (BID) | ORAL | 1 refills | Status: AC

## 2024-04-16 MED ORDER — METFORMIN HCL 1000 MG PO TABS: 1000.0000 mg | ORAL_TABLET | Freq: Two times a day (BID) | ORAL | 1 refills | Status: AC

## 2024-04-16 MED ORDER — METOPROLOL TARTRATE 50 MG PO TABS: 100.0000 mg | ORAL_TABLET | Freq: Two times a day (BID) | ORAL | 1 refills | Status: AC

## 2024-04-16 MED ORDER — ATORVASTATIN CALCIUM 80 MG PO TABS: 80.0000 mg | ORAL_TABLET | Freq: Every day | ORAL | 1 refills | Status: AC

## 2024-04-16 MED ORDER — VITAMIN D3 25 MCG (1000 UT) PO CAPS: 2000.0000 [IU] | ORAL_CAPSULE | Freq: Every day | ORAL | Status: AC

## 2024-04-16 MED ORDER — VITAMIN B 12 500 MCG PO TABS: 1000.0000 ug | ORAL_TABLET | Freq: Every day | ORAL | Status: AC

## 2024-04-16 NOTE — Patient Instructions (Addendum)
 GO TO THE LAB :  Get the blood work    Then, go to the front desk for the checkout Please make an appointment for a physical exam in 4 months   Continue checking your blood pressure regularly Blood pressure goal:  between 110/65 and  135/85. If it is consistently higher or lower, let me know  Diabetes: Continue checking your blood sugar You can check your sugars at different times  - early in AM fasting  ( blood sugar goal 70-130) - 2 hours after a meal (blood sugar goal less than 180)        NUTRITIONAL STATUS AND WEIGHT MANAGEMENT: You have experienced a 4 lb weight gain over the past month due to increased social eating. Your current BMI is healthy. -Try to have regular meals with your family. -Focus on healthy eating habits, including fruits, vegetables, and adequate hydration.  HYPERTENSION: Your blood pressure is well-controlled with your current medications. -Continue taking metoprolol  and amlodipine  as prescribed. -Monitor your blood pressure regularly.  TYPE 2 DIABETES MELLITUS: Your blood sugar levels are well-controlled with occasional readings between 97-103 mg/dL. -Continue taking metformin  as prescribed. -Try to monitor your blood sugar more consistently.  HYPERLIPIDEMIA: Your cholesterol levels are well-controlled with atorvastatin . -Continue taking atorvastatin  80 mg daily.  VITAMIN D  DEFICIENCY: You have a history of vitamin D  deficiency. - Take at least 2000 units of vitamin D  every day    VITAMIN B12 DEFICIENCY:  You have a history of vitamin B12 deficiency and have completed a course of B12 injections. - Take at least 1000 mcg of vitamin B12 every day over-the-counter

## 2024-04-16 NOTE — Progress Notes (Unsigned)
 "  Subjective:    Patient ID: Casey Bryan, female    DOB: 09/28/1945, 78 y.o.   MRN: [id number]  DOS:  04/16/2024 Follow-up, here with her daughter  Discussed the use of AI scribe software for clinical note transcription with the patient, who gave verbal consent to proceed.  History of Present Illness Casey Bryan is a 78 year old female who presents for a follow-up visit. She is accompanied by Naveen, a family member.  Weight fluctuation and nutritional intake - 4 lb weight gain over the past month following prior weight loss - Attributes weight gain to increased social eating - Inconsistent daily intake, sometimes eating very little by mid-afternoon - Desires to improve eating habits  Hypertension management - Hypertension managed with metoprolol  and recently added amlodipine  by cardiologist - Weekly blood pressure monitoring with recent readings in the 130s/60s  Diabetes mellitus type 2 - Diabetes treated with metformin  - Blood glucose checked weekly, with values around 97 to 103 mg/dL  Vitamin d  and b12 deficiency - History of vitamin D  and B12 deficiency - Previously received B12 injections, currently taking a multivitamin - Calcium  with vitamin D  taken every other day - No separate vitamin D  supplement  Adverse reactions to vaccinations - Reports prior reactions to influenza and pneumonia vaccines    Psychological well-being - Feels emotionally well - Continues regular visits with psychologist   Wt Readings from Last 3 Encounters:  04/16/24 183 lb (83 kg)  03/08/24 179 lb 12.8 oz (81.6 kg)  02/09/24 182 lb 12.8 oz (82.9 kg)     Review of Systems See above   Past Medical History:  Diagnosis Date   Amaurosis fugax 12/12/2015   Diabetes mellitus    GERD (gastroesophageal reflux disease)    Glaucoma 04-2012   Hyperlipemia    Hypertension     Past Surgical History:  Procedure Laterality Date   NO PAST SURGERIES      Current Outpatient Medications   Medication Instructions   amLODipine  (NORVASC ) 5 mg, Oral, Daily   aspirin  EC 81 mg, Daily   atorvastatin  (LIPITOR) 80 mg, Oral, Daily at bedtime   Calcium  Carbonate-Vit D-Min (CALCIUM  1200 PO) 1 capsule, Daily   esomeprazole  (NEXIUM ) 40 mg, Oral, 2 times daily before meals   Ferrous Sulfate ([employer] IRON PO) Take by mouth.   metFORMIN  (GLUCOPHAGE ) 1,000 mg, Oral, 2 times daily with meals   metoprolol  tartrate (LOPRESSOR ) 100 mg, Oral, 2 times daily   Multiple Vitamin (MULTIVITAMIN) tablet 1 tablet, Daily   Multiple Vitamins-Minerals (ICAPS AREDS 2 PO) 1 capsule, 2 times daily   Probiotic Product (PROBIOTIC ADVANCED PO) 1 tablet, Daily   Vitamin B 12 1,000 mcg, Oral, Daily   Vitamin D3 2,000 Units, Oral, Daily       Objective:   Physical Exam BP 136/66   Pulse (!) 58   Temp 97.9 F (36.6 C) (Oral)   Resp 16   Ht 5' 4 (1.626 m)   Wt 183 lb (83 kg)   SpO2 97%   BMI 31.41 kg/m  General:   Well developed, NAD, BMI noted. HEENT:  Normocephalic . Face symmetric, atraumatic Lungs:  CTA B Normal respiratory effort, no intercostal retractions, no accessory muscle use. Heart: RRR,  no murmur.  Lower extremities: no pretibial edema bilaterally  Skin: Not pale. Not jaundice Neurologic:  alert & oriented X3.  Speech normal, gait appropriate for age and unassisted Psych--  Cognition and judgment appear intact.  Cooperative with normal attention span  and concentration.  Behavior appropriate. No anxious or depressed appearing.      Assessment   Assessment: DM HTN (intolerant amlodipine  11-2015 and 2018) Hyperlipidemia Glaucoma dx 2014, cataracts NEURO: L amaurosix fugax 03-2015, CVA -- saw opthalmology, started ASA  (02-2015); echo 04-09-15: nl EF, grade 2 Diast disfx -- saw neuro: Probable cryptogenic  --Headaches: Sees  neurology   CV: + FH CV Dz Carotid artery disease:    US  04-2015: Progression of the right ICA now 40-59%. Stable left ICA 1-39%. US  02/2018, 07-30-2020 :  carotids essentially normal GI: -GERD -Anemia: EGD colonoscopy 2017, Dr. Celestia,  [place] polyp removed   Assessment & Plan Weight gain: She lost some weight due to grieving her husband, now weight is coming back.   Current BMI is healthy.  Advised about healthy  habits including fruits, vegetables, and adequate hydration. HTN: Cardiology added amlodipine  recently, she is also on metoprolol .  Seems well-controlled, no change DM: Blood sugar levels well-controlled with occasional readings between 97-103 mg/dL. - Continue metformin  as prescribed.  Check A1c and micro High cholesterol Cholesterol levels well-controlled with atorvastatin  80 mg daily.  RF sent Vitamin D  deficiency Recommended daily intake of at least 2000 units of vitamin D .  She is somewhat concerned about the dose but advised the risk of intoxication is really minimal. B12 deficiency Completed course of B12 injections.  Will transition her to oral supplements, check B12 levels. Depression, grieving Emotionally stable with ongoing psychological counseling.  General Health Maintenance Had vaccine for the flu, benefits of a COVID vaccine discussed. RTC 4 months    "

## 2024-04-17 LAB — HEMOGLOBIN A1C: Hgb A1c MFr Bld: 6.4 % (ref 4.6–6.5)

## 2024-04-17 LAB — MICROALBUMIN / CREATININE URINE RATIO
Creatinine,U: 84.8 mg/dL
Microalb Creat Ratio: 508.9 mg/g — ABNORMAL HIGH (ref 0.0–30.0)
Microalb, Ur: 43.2 mg/dL — ABNORMAL HIGH (ref 0.7–1.9)

## 2024-04-17 LAB — VITAMIN D 25 HYDROXY (VIT D DEFICIENCY, FRACTURES): VITD: 36.71 ng/mL (ref 30.00–100.00)

## 2024-04-17 LAB — B12 AND FOLATE PANEL
Folate: >23.7 ng/mL
Vitamin B-12: 301 pg/mL (ref 211–911)

## 2024-04-17 NOTE — Assessment & Plan Note (Signed)
 Weight gain: She lost some weight due to grieving her husband, now weight is coming back.   Current BMI is healthy.  Advised about healthy  habits including fruits, vegetables, and adequate hydration. HTN: Cardiology added amlodipine  recently, she is also on metoprolol .  Seems well-controlled, no change DM: Blood sugar levels well-controlled with occasional readings between 97-103 mg/dL. - Continue metformin  as prescribed.  Check A1c and micro High cholesterol Cholesterol levels well-controlled with atorvastatin  80 mg daily.  RF sent Vitamin D  deficiency Recommended daily intake of at least 2000 units of vitamin D .  She is somewhat concerned about the dose but advised the risk of intoxication is really minimal. B12 deficiency Completed course of B12 injections.  Will transition her to oral supplements, check B12 levels. Depression, grieving Emotionally stable with ongoing psychological counseling.  General Health Maintenance Had vaccine for the flu, benefits of a COVID vaccine discussed. RTC 4 months

## 2024-04-20 ENCOUNTER — Ambulatory Visit: Payer: Self-pay | Admitting: Internal Medicine

## 2024-04-20 ENCOUNTER — Telehealth: Payer: Self-pay | Admitting: Internal Medicine

## 2024-04-20 ENCOUNTER — Other Ambulatory Visit: Payer: Self-pay | Admitting: Internal Medicine

## 2024-04-20 DIAGNOSIS — I10 Essential (primary) hypertension: Secondary | ICD-10-CM

## 2024-04-20 MED ORDER — LOSARTAN POTASSIUM 50 MG PO TABS: 50.0000 mg | ORAL_TABLET | Freq: Every day | ORAL | 1 refills | Status: DC

## 2024-04-20 NOTE — Telephone Encounter (Signed)
 Information, provider comments and recommendations discussed with patient's daughter.  Rx was sent to patient's pharmacy and scheduled to come back in 2 weeks.

## 2024-04-20 NOTE — Telephone Encounter (Signed)
 Blood work looks good except she has protein in the urine.    Recommend to start   losartan  50 mg 1 tablet daily, send 1 month supply.  Advise  this medication can drop her blood pressure so needs to check BPs daily and call me if less than 120/75 or if she has side effects. Recommend good hydration.  Arrange for a BMP in 2 weeks, Dx DM

## 2024-04-23 ENCOUNTER — Ambulatory Visit: Attending: Nurse Practitioner | Admitting: Nurse Practitioner

## 2024-04-23 ENCOUNTER — Encounter: Payer: Self-pay | Admitting: Nurse Practitioner

## 2024-04-23 ENCOUNTER — Telehealth: Payer: Self-pay | Admitting: Internal Medicine

## 2024-04-23 ENCOUNTER — Other Ambulatory Visit: Payer: Self-pay | Admitting: Internal Medicine

## 2024-04-23 VITALS — BP 142/68 | HR 86 | Ht 63.0 in | Wt 182.0 lb

## 2024-04-23 DIAGNOSIS — I10 Essential (primary) hypertension: Secondary | ICD-10-CM | POA: Insufficient documentation

## 2024-04-23 DIAGNOSIS — E785 Hyperlipidemia, unspecified: Secondary | ICD-10-CM | POA: Diagnosis not present

## 2024-04-23 DIAGNOSIS — I480 Paroxysmal atrial fibrillation: Secondary | ICD-10-CM | POA: Insufficient documentation

## 2024-04-23 DIAGNOSIS — Z1231 Encounter for screening mammogram for malignant neoplasm of breast: Secondary | ICD-10-CM

## 2024-04-23 DIAGNOSIS — E119 Type 2 diabetes mellitus without complications: Secondary | ICD-10-CM | POA: Insufficient documentation

## 2024-04-23 DIAGNOSIS — I6523 Occlusion and stenosis of bilateral carotid arteries: Secondary | ICD-10-CM | POA: Diagnosis not present

## 2024-04-23 NOTE — Telephone Encounter (Signed)
 Chart reviewed, BMP ordered. Pt already scheduled for lab appt.

## 2024-04-23 NOTE — Progress Notes (Signed)
 "  Office Visit    Patient Name: Casey Bryan Date of Encounter: 04/23/2024  Primary Care Provider:  Amon Aloysius FORBES, MD Primary Cardiologist:  Maude Emmer, MD  Chief Complaint    79 year old female with a history of paroxysmal atrial fibrillation, mitral valve regurgitation, hypertension, hyperlipidemia, type 2 diabetes, amaurosis fugax, carotid artery stenosis, glaucoma, and GERD who presents for follow-up related to hypertension.   Past Medical History    Past Medical History:  Diagnosis Date   Amaurosis fugax 12/12/2015   Diabetes mellitus    GERD (gastroesophageal reflux disease)    Glaucoma 04-2012   Hyperlipemia    Hypertension    Past Surgical History:  Procedure Laterality Date   NO PAST SURGERIES      Allergies  Allergies[1]   Labs/Other Studies Reviewed    The following studies were reviewed today:  Cardiac Studies & Procedures   ______________________________________________________________________________________________     ECHOCARDIOGRAM  ECHOCARDIOGRAM COMPLETE 08/16/2023  Narrative ECHOCARDIOGRAM REPORT    Patient Name:   Casey Bryan Date of Exam: 08/16/2023 Medical Rec #:  [id number]     Height:       64.0 in Accession #:    [id number]    Weight:       167.8 lb Date of Birth:  1945-07-21      BSA:          1.816 m Patient Age:    77 years      BP:           174/51 mmHg Patient Gender: F             HR:           85 bpm. Exam Location:  Inpatient  Procedure: 2D Echo, Cardiac Doppler and Color Doppler (Both Spectral and Color Flow Doppler were utilized during procedure).  Indications:    I48.91* Unspeicified atrial fibrillation  History:        Patient has prior history of Echocardiogram examinations, most recent 04/09/2015. Arrythmias:Atrial Fibrillation; Risk Factors:Hypertension and Diabetes.  Sonographer:    Eva Lash Referring Phys: [id number] [facility]  IMPRESSIONS   1. Left ventricular ejection fraction, by estimation, is 55 to  60%. The left ventricle has normal function. The left ventricle has no regional wall motion abnormalities. Left ventricular diastolic parameters were normal. 2. Right ventricular systolic function is normal. The right ventricular size is normal. There is normal pulmonary artery systolic pressure. The estimated right ventricular systolic pressure is 31.7 mmHg. 3. Left atrial size was moderately dilated. 4. The mitral valve is normal in structure. Mild mitral valve regurgitation. 5. The aortic valve is tricuspid. There is mild calcification of the aortic valve. Aortic valve regurgitation is not visualized. Aortic valve sclerosis/calcification is present, without any evidence of aortic stenosis. 6. The inferior vena cava is normal in size with greater than 50% respiratory variability, suggesting right atrial pressure of 3 mmHg.  Comparison(s): Prior images unable to be directly viewed, comparison made by report only.  FINDINGS Left Ventricle: Left ventricular ejection fraction, by estimation, is 55 to 60%. The left ventricle has normal function. The left ventricle has no regional wall motion abnormalities. The left ventricular internal cavity size was normal in size. There is no left ventricular hypertrophy. Left ventricular diastolic parameters were normal.  Right Ventricle: The right ventricular size is normal. No increase in right ventricular wall thickness. Right ventricular systolic function is normal. There is normal pulmonary artery systolic pressure. The tricuspid regurgitant velocity is  2.68 m/s, and with an assumed right atrial pressure of 3 mmHg, the estimated right ventricular systolic pressure is 31.7 mmHg.  Left Atrium: Left atrial size was moderately dilated.  Right Atrium: Right atrial size was normal in size.  Pericardium: There is no evidence of pericardial effusion.  Mitral Valve: The mitral valve is normal in structure. Mild mitral valve regurgitation, with centrally-directed  jet.  Tricuspid Valve: The tricuspid valve is normal in structure. Tricuspid valve regurgitation is trivial.  Aortic Valve: The aortic valve is tricuspid. There is mild calcification of the aortic valve. Aortic valve regurgitation is not visualized. Aortic valve sclerosis/calcification is present, without any evidence of aortic stenosis. Aortic valve mean gradient measures 11.0 mmHg. Aortic valve peak gradient measures 19.2 mmHg. Aortic valve area, by VTI measures 1.55 cm.  Pulmonic Valve: The pulmonic valve was grossly normal. Pulmonic valve regurgitation is not visualized. No evidence of pulmonic stenosis.  Aorta: The aortic root is normal in size and structure.  Venous: The inferior vena cava is normal in size with greater than 50% respiratory variability, suggesting right atrial pressure of 3 mmHg.  IAS/Shunts: No atrial level shunt detected by color flow Doppler.   LEFT VENTRICLE PLAX 2D LVIDd:         4.60 cm      Diastology LVIDs:         3.50 cm      LV e' medial:    10.70 cm/s LV PW:         1.10 cm      LV E/e' medial:  10.9 LV IVS:        1.10 cm      LV e' lateral:   10.30 cm/s LVOT diam:     1.90 cm      LV E/e' lateral: 11.3 LV SV:         58 LV SV Index:   32 LVOT Area:     2.84 cm  LV Volumes (MOD) LV vol d, MOD A2C: 100.0 ml LV vol d, MOD A4C: 110.0 ml LV vol s, MOD A2C: 39.1 ml LV vol s, MOD A4C: 37.7 ml LV SV MOD A2C:     60.9 ml LV SV MOD A4C:     110.0 ml LV SV MOD BP:      66.7 ml  RIGHT VENTRICLE RV S prime:     15.50 cm/s TAPSE (M-mode): 2.3 cm  LEFT ATRIUM             Index LA diam:        3.90 cm 2.15 cm/m LA Vol (A2C):   85.7 ml 47.20 ml/m LA Vol (A4C):   68.2 ml 37.56 ml/m LA Biplane Vol: 79.8 ml 43.95 ml/m AORTIC VALVE AV Area (Vmax):    1.49 cm AV Area (Vmean):   1.37 cm AV Area (VTI):     1.55 cm AV Vmax:           219.00 cm/s AV Vmean:          152.000 cm/s AV VTI:            0.376 m AV Peak Grad:      19.2 mmHg AV Mean  Grad:      11.0 mmHg LVOT Vmax:         115.00 cm/s LVOT Vmean:        73.200 cm/s LVOT VTI:          0.205 m LVOT/AV VTI ratio: 0.55  AORTA  Ao Asc diam: 3.30 cm  MITRAL VALVE                TRICUSPID VALVE MV Area (PHT): 5.36 cm     TR Peak grad:   28.7 mmHg MV Decel Time: 142 msec     TR Vmax:        268.00 cm/s MV E velocity: 116.50 cm/s MV A velocity: 72.20 cm/s   SHUNTS MV E/A ratio:  1.61         Systemic VTI:  0.20 m Systemic Diam: 1.90 cm  Mihai Croitoru MD Electronically signed by Jerel Balding MD Signature Date/Time: 08/16/2023/4:41:50 PM    Final    MONITORS  CARDIAC EVENT MONITOR 09/22/2023  Narrative   Predominant rhythm was normal sinus rhythm with an average heart rate of 67 bpm.  The heart rate ranged from 51 to 119 bpm   Occasional PACs and PVCs   Nonsustained atrial tachycardia       ______________________________________________________________________________________________     Recent Labs: 08/16/2023: B Natriuretic Peptide 582.2; Magnesium  2.1; TSH 0.794 08/23/2023: ALT 23 09/06/2023: Hemoglobin 10.4; Platelets 246 09/13/2023: BUN 22; Creatinine, Ser 0.95; Potassium 4.0; Sodium 137  Recent Lipid Panel    Component Value Date/Time   CHOL 129 06/24/2023 1557   TRIG 73 06/24/2023 1557   TRIG 52 03/15/2006 1019   HDL 53 06/24/2023 1557   CHOLHDL 2.4 06/24/2023 1557   VLDL 15.8 06/08/2022 1042   LDLCALC 61 06/24/2023 1557    History of Present Illness    79 year old female with the above past medical history including paroxysmal atrial fibrillation, mitral valve regurgitation, hypertension, hyperlipidemia, type 2 diabetes, amaurosis fugax, carotid artery stenosis, glaucoma, and GERD.  She was hospitalized in April 2025 in the setting of severe electrolyte abnormalities (sodium 115, potassium 2.9, magnesium  0.9, serum creatinine 1.24, hemoglobin 10.8) UTI, pneumonia, new onset atrial fibrillation with RVR.  She spontaneously converted to normal  sinus rhythm.  Echocardiogram showed EF 55 to 60%, no RWMA, normal RV systolic function, normal PA pressure, mild MR.  Anticoagulation was deferred due to single episode of atrial fibrillation, transient in the setting of marked metabolic abnormalities. Nephrology was consulted who recommended discontinuing chlorthalidone  indefinitely.  30-day event monitor in 08/2023 showed predominantly normal sinus rhythm, average heart rate 67 bpm, occasional PACs and PVCs, nonsustained atrial tachycardia, no evidence of atrial fibrillation. Carotid ultrasound in 08/2023 showed mild atherosclerotic changes in the right internal carotid artery, less than 50% stenosis, mild atherosclerotic changes in the left internal carotid artery, less than 50% stenosis.  She was last seen in the office on 02/09/2024 and was stable from a cardiac standpoint.  She was started on amlodipine  in the setting of mildly elevated BP.   She presents today for follow-up accompanied by her daughter. Since her last visit she has done well from a cardiac standpoint.  She has stable nonpitting bilateral lower extremity edema, unchanged from prior visits, this is chronic.  She her PCP recently and was started on losartan  50 mg daily.  She notes occasional lightheadedness since starting the medication, she denies any palpitations, presyncope, syncope.  Denies symptoms concerning for angina.  Overall, she reports feeling well.  Home Medications    Current Outpatient Medications  Medication Sig Dispense Refill   amLODipine  (NORVASC ) 5 MG tablet Take 1 tablet (5 mg total) by mouth daily. 90 tablet 3   aspirin  EC 81 MG tablet Take 81 mg by mouth daily.     atorvastatin  (LIPITOR) 80  MG tablet Take 1 tablet (80 mg total) by mouth at bedtime. 90 tablet 1   Calcium  Carbonate-Vit D-Min (CALCIUM  1200 PO) Take 1 capsule by mouth daily at 12 noon.     esomeprazole  (NEXIUM ) 40 MG capsule Take 1 capsule (40 mg total) by mouth 2 (two) times daily before a meal. 180  capsule 1   Ferrous Sulfate ([employer] IRON PO) Take by mouth.     losartan  (COZAAR ) 50 MG tablet Take 1 tablet (50 mg total) by mouth daily. 30 tablet 1   metFORMIN  (GLUCOPHAGE ) 1000 MG tablet Take 1 tablet (1,000 mg total) by mouth 2 (two) times daily with a meal. 180 tablet 1   metoprolol  tartrate (LOPRESSOR ) 50 MG tablet Take 2 tablets (100 mg total) by mouth 2 (two) times daily. 360 tablet 1   Multiple Vitamin (MULTIVITAMIN) tablet Take 1 tablet by mouth daily.     Multiple Vitamins-Minerals (ICAPS AREDS 2 PO) Take 1 capsule by mouth in the morning and at bedtime.     Cholecalciferol (VITAMIN D3) 25 MCG (1000 UT) CAPS Take 2 capsules (2,000 Units total) by mouth daily. (Patient not taking: Reported on 04/23/2024)     Cyanocobalamin  (VITAMIN B 12) 500 MCG TABS Take 1,000 mcg by mouth daily. (Patient not taking: Reported on 04/23/2024)     Probiotic Product (PROBIOTIC ADVANCED PO) Take 1 tablet by mouth daily. (Patient not taking: Reported on 04/23/2024)     No current facility-administered medications for this visit.     Review of Systems    She denies chest pain, palpitations, dyspnea, pnd, orthopnea, n, v, dizziness, syncope, edema, weight gain, or early satiety. All other systems reviewed and are otherwise negative except as noted above.   Physical Exam    VS:  BP (!) 142/68 (BP Location: Left Arm, Patient Position: Sitting, Cuff Size: Large)   Pulse 86   Ht 5' 3 (1.6 m)   Wt 182 lb (82.6 kg)   SpO2 95%   BMI 32.24 kg/m   GEN: Well nourished, well developed, in no acute distress. HEENT: normal. Neck: Supple, no JVD, carotid bruits, or masses. Cardiac: RRR, no murmurs, rubs, or gallops. No clubbing, cyanosis, edema.  Radials/DP/PT 2+ and equal bilaterally.  Respiratory:  Respirations regular and unlabored, clear to auscultation bilaterally. GI: Soft, nontender, nondistended, BS + x 4. MS: no deformity or atrophy. Skin: warm and dry, no rash. Neuro:  Strength and sensation are  intact. Psych: Normal affect.  Accessory Clinical Findings    ECG personally reviewed by me today -    - no EKG in office today.    Lab Results  Component Value Date   WBC 3.6 09/06/2023   HGB 10.4 (L) 09/06/2023   HCT 32.1 (L) 09/06/2023   MCV 91 09/06/2023   PLT 246 09/06/2023   Lab Results  Component Value Date   CREATININE 0.95 09/13/2023   BUN 22 09/13/2023   NA 137 09/13/2023   K 4.0 09/13/2023   CL 101 09/13/2023   CO2 29 09/13/2023   Lab Results  Component Value Date   ALT 23 08/23/2023   AST 16 08/23/2023   ALKPHOS 48 08/23/2023   BILITOT 0.4 08/23/2023   Lab Results  Component Value Date   CHOL 129 06/24/2023   HDL 53 06/24/2023   LDLCALC 61 06/24/2023   TRIG 73 06/24/2023   CHOLHDL 2.4 06/24/2023    Lab Results  Component Value Date   HGBA1C 6.4 04/16/2024    Assessment & Plan  1. Paroxysmal atrial fibrillation: Occurred in the setting of acute illness (left lower lobe pneumonia). Anticoagulation was deferred due to single episode, transient, in the setting of marked metabolic abnormalities, acute illness.  Echocardiogram stable as below.  30-day event monitor in 08/2023 showed predominantly normal sinus rhythm, average heart rate 67 bpm, occasional PACs and PVCs, nonsustained atrial tachycardia, no evidence of atrial fibrillation.  Maintaining sinus rhythm on exam.  Denies palpitations.  Not on anticoagulation as above.   2. Hypertension: BP generally well controlled.  Losartan -HCTZ was previously discontinued in the setting of electrolyte abnormalities.  Started on amlodipine  at her last office visit.  Her PCP recently started her on losartan  (she has only been taking the medication for 1 day). Will update BMET today.  She is pending repeat BMET in 2 weeks per PCP.  Continue to monitor BP and report BP consistently greater than 130/80 mmHg.  Continue current antihypertensive regimen.   3. Mitral valve regurgitation: Echocardiogram in 08/2023 showed EF 55  to 60%, no RWMA, normal RV systolic function, normal PA pressure, mild MR. Asymptomatic. Euvolemic and well compensated on exam. Consider repeat echocardiogram in 3-5 years, sooner if clinically indicated.  4. Hyperlipidemia: LDL was 61 in 06/2023.  Continue Lipitor.   5. Type 2 diabetes: A1c was 6.4 in 03/2024.  Monitored and managed per PCP.   6. Carotid artery stenosis/amaurosis fugax: Carotid ultrasound in 07/2020 showed minimal plaque. Carotid ultrasound in 08/2023 showed mild atherosclerotic changes in the right internal carotid artery, less than 50% stenosis, mild atherosclerotic changes in the left internal carotid artery, less than 50% stenosis.  Asymptomatic.  Consider repeat ultrasound as clinically indicated.  Continue ASA, Lipitor.    7. Disposition: Follow-up in 6 months, sooner if needed.   Damien JAYSON Braver, NP 04/23/2024, 1:58 PM       [1]  Allergies Allergen Reactions   Prevnar [Pneumococcal 13-Val Conj Vacc] Nausea And Vomiting and Other (See Comments)    Fever of 102.    Clarithromycin  [Clarithromycin ] Other (See Comments)    Contradictions w/Vytorin    Tetanus-Diphtheria Toxoids Td Nausea And Vomiting    REACTION: see OV note  02-11-10   "

## 2024-04-23 NOTE — Addendum Note (Signed)
 Addended by: Carmela Piechowski D on: 04/23/2024 07:58 AM   Modules accepted: Orders

## 2024-04-23 NOTE — Patient Instructions (Signed)
 Medication Instructions:  Your physician recommends that you continue on your current medications as directed. Please refer to the Current Medication list given to you today.  *If you need a refill on your cardiac medications before your next appointment, please call your pharmacy*  Lab Work: BMET today  Testing/Procedures: NONE ordered at this time of appointment   Follow-Up: At [facility], you and your health needs are our priority.  As part of our continuing mission to provide you with exceptional heart care, our providers are all part of one team.  This team includes your primary Cardiologist (physician) and Advanced Practice Providers or APPs (Physician Assistants and Nurse Practitioners) who all work together to provide you with the care you need, when you need it.  Your next appointment:   6 month(s)  Provider:   Maude Emmer, MD    We recommend signing up for the patient portal called MyChart.  Sign up information is provided on this After Visit Summary.  MyChart is used to connect with patients for Virtual Visits (Telemedicine).  Patients are able to view lab/test results, encounter notes, upcoming appointments, etc.  Non-urgent messages can be sent to your provider as well.   To learn more about what you can do with MyChart, go to [email or web address].

## 2024-04-23 NOTE — Telephone Encounter (Signed)
 This patient may need a lab appointment

## 2024-04-24 LAB — BASIC METABOLIC PANEL WITH GFR
BUN/Creatinine Ratio: 23 (ref 12–28)
BUN: 22 mg/dL (ref 8–27)
CO2: 24 mmol/L (ref 20–29)
Calcium: 9 mg/dL (ref 8.7–10.3)
Chloride: 101 mmol/L (ref 96–106)
Creatinine, Ser: 0.94 mg/dL (ref 0.57–1.00)
Glucose: 87 mg/dL (ref 70–99)
Potassium: 4.1 mmol/L (ref 3.5–5.2)
Sodium: 140 mmol/L (ref 134–144)
eGFR: 62 mL/min/1.73

## 2024-04-25 ENCOUNTER — Ambulatory Visit: Payer: Self-pay | Admitting: Nurse Practitioner

## 2024-05-04 ENCOUNTER — Other Ambulatory Visit

## 2024-05-04 DIAGNOSIS — I10 Essential (primary) hypertension: Secondary | ICD-10-CM | POA: Diagnosis not present

## 2024-05-04 LAB — BASIC METABOLIC PANEL WITH GFR
BUN: 16 mg/dL (ref 6–23)
CO2: 30 meq/L (ref 19–32)
Calcium: 8.7 mg/dL (ref 8.4–10.5)
Chloride: 101 meq/L (ref 96–112)
Creatinine, Ser: 0.94 mg/dL (ref 0.40–1.20)
GFR: 58.48 mL/min — ABNORMAL LOW
Glucose, Bld: 136 mg/dL — ABNORMAL HIGH (ref 70–99)
Potassium: 4.3 meq/L (ref 3.5–5.1)
Sodium: 137 meq/L (ref 135–145)

## 2024-05-07 ENCOUNTER — Ambulatory Visit: Payer: Self-pay | Admitting: Internal Medicine

## 2024-05-07 MED ORDER — LOSARTAN POTASSIUM 50 MG PO TABS: 50.0000 mg | ORAL_TABLET | Freq: Every day | ORAL | 1 refills | Status: AC

## 2024-05-10 ENCOUNTER — Ambulatory Visit
Admission: RE | Admit: 2024-05-10 | Discharge: 2024-05-10 | Disposition: A | Discharge status: Home / Self Care | Source: Ambulatory Visit | Attending: Internal Medicine | Admitting: Internal Medicine

## 2024-05-10 DIAGNOSIS — Z1231 Encounter for screening mammogram for malignant neoplasm of breast: Secondary | ICD-10-CM

## 2024-08-21 ENCOUNTER — Encounter: Admitting: Internal Medicine

## 2025-03-15 ENCOUNTER — Ambulatory Visit
